# Patient Record
Sex: Female | Born: 1945
Health system: Southern US, Community
[De-identification: ages and names within clinical notes are randomized; demographics above are authoritative.]

## PROBLEM LIST (undated history)

## (undated) DIAGNOSIS — D259 Leiomyoma of uterus, unspecified: Secondary | ICD-10-CM

## (undated) DIAGNOSIS — I10 Essential (primary) hypertension: Secondary | ICD-10-CM

## (undated) DIAGNOSIS — R739 Hyperglycemia, unspecified: Secondary | ICD-10-CM

## (undated) DIAGNOSIS — I639 Cerebral infarction, unspecified: Secondary | ICD-10-CM

## (undated) DIAGNOSIS — IMO0002 Reserved for concepts with insufficient information to code with codable children: Secondary | ICD-10-CM

## (undated) DIAGNOSIS — Z86711 Personal history of pulmonary embolism: Secondary | ICD-10-CM

## (undated) DIAGNOSIS — Z780 Asymptomatic menopausal state: Secondary | ICD-10-CM

## (undated) DIAGNOSIS — N870 Mild cervical dysplasia: Secondary | ICD-10-CM

## (undated) DIAGNOSIS — H269 Unspecified cataract: Secondary | ICD-10-CM

## (undated) DIAGNOSIS — Z803 Family history of malignant neoplasm of breast: Secondary | ICD-10-CM

## (undated) DIAGNOSIS — E119 Type 2 diabetes mellitus without complications: Secondary | ICD-10-CM

## (undated) DIAGNOSIS — F32A Depression, unspecified: Secondary | ICD-10-CM

## (undated) DIAGNOSIS — F419 Anxiety disorder, unspecified: Secondary | ICD-10-CM

## (undated) DIAGNOSIS — H3320 Serous retinal detachment, unspecified eye: Secondary | ICD-10-CM

## (undated) DIAGNOSIS — F329 Major depressive disorder, single episode, unspecified: Secondary | ICD-10-CM

## (undated) HISTORY — DX: Asymptomatic menopausal state: Z78.0

## (undated) HISTORY — DX: Essential (primary) hypertension: I10

## (undated) HISTORY — DX: Mild cervical dysplasia: N87.0

## (undated) HISTORY — PX: OTHER SURGICAL HISTORY: SHX169

## (undated) HISTORY — DX: Unspecified cataract: H26.9

## (undated) HISTORY — DX: Family history of malignant neoplasm of breast: Z80.3

## (undated) HISTORY — DX: Personal history of pulmonary embolism: Z86.711

## (undated) HISTORY — DX: Hyperglycemia, unspecified: R73.9

## (undated) HISTORY — DX: Serous retinal detachment, unspecified eye: H33.20

## (undated) HISTORY — PX: HYSTEROSCOPY: SHX211

## (undated) HISTORY — DX: Anxiety disorder, unspecified: F41.9

## (undated) HISTORY — DX: Leiomyoma of uterus, unspecified: D25.9

## (undated) HISTORY — DX: Reserved for concepts with insufficient information to code with codable children: IMO0002

## (undated) HISTORY — PX: BREAST CYST ASPIRATION: SHX578

## (undated) HISTORY — DX: Type 2 diabetes mellitus without complications: E11.9

## (undated) HISTORY — DX: Depression, unspecified: F32.A

## (undated) HISTORY — DX: Major depressive disorder, single episode, unspecified: F32.9

## (undated) HISTORY — PX: RETINAL DETACHMENT SURGERY: SHX105

## (undated) HISTORY — DX: Cerebral infarction, unspecified: I63.9

## (undated) HISTORY — PX: EYE SURGERY: SHX253

## (undated) HISTORY — PX: DILATION AND CURETTAGE OF UTERUS: SHX78

---

## 1999-12-18 ENCOUNTER — Encounter: Payer: Self-pay | Admitting: Neurosurgery

## 1999-12-22 ENCOUNTER — Encounter: Payer: Self-pay | Admitting: Neurosurgery

## 1999-12-22 ENCOUNTER — Inpatient Hospital Stay (HOSPITAL_COMMUNITY): Admission: RE | Admit: 1999-12-22 | Discharge: 1999-12-23 | Payer: Self-pay | Admitting: Neurosurgery

## 2000-01-20 ENCOUNTER — Encounter: Admission: RE | Admit: 2000-01-20 | Discharge: 2000-01-20 | Payer: Self-pay | Admitting: Neurosurgery

## 2000-01-20 ENCOUNTER — Encounter: Payer: Self-pay | Admitting: Neurosurgery

## 2000-04-06 ENCOUNTER — Ambulatory Visit (HOSPITAL_COMMUNITY): Admission: RE | Admit: 2000-04-06 | Discharge: 2000-04-06 | Payer: Self-pay | Admitting: Neurosurgery

## 2000-04-06 ENCOUNTER — Encounter: Payer: Self-pay | Admitting: Neurosurgery

## 2001-01-10 ENCOUNTER — Encounter: Admission: RE | Admit: 2001-01-10 | Discharge: 2001-01-10 | Payer: Self-pay | Admitting: Gynecology

## 2001-01-10 ENCOUNTER — Encounter: Payer: Self-pay | Admitting: Gynecology

## 2001-07-18 ENCOUNTER — Encounter: Payer: Self-pay | Admitting: Ophthalmology

## 2001-07-18 ENCOUNTER — Ambulatory Visit (HOSPITAL_COMMUNITY): Admission: RE | Admit: 2001-07-18 | Discharge: 2001-07-19 | Payer: Self-pay | Admitting: Ophthalmology

## 2002-04-02 ENCOUNTER — Encounter: Admission: RE | Admit: 2002-04-02 | Discharge: 2002-04-02 | Payer: Self-pay | Admitting: Neurosurgery

## 2002-04-02 ENCOUNTER — Encounter: Payer: Self-pay | Admitting: Neurosurgery

## 2005-06-03 ENCOUNTER — Ambulatory Visit: Payer: Self-pay | Admitting: Obstetrics and Gynecology

## 2006-05-26 ENCOUNTER — Emergency Department: Payer: Self-pay | Admitting: General Practice

## 2006-05-26 ENCOUNTER — Other Ambulatory Visit: Payer: Self-pay

## 2006-08-17 ENCOUNTER — Ambulatory Visit: Payer: Self-pay | Admitting: General Surgery

## 2008-10-11 HISTORY — PX: CHOLECYSTECTOMY: SHX55

## 2011-11-19 ENCOUNTER — Encounter (INDEPENDENT_AMBULATORY_CARE_PROVIDER_SITE_OTHER): Payer: Medicare Other | Admitting: Ophthalmology

## 2011-11-19 DIAGNOSIS — H2510 Age-related nuclear cataract, unspecified eye: Secondary | ICD-10-CM

## 2011-11-19 DIAGNOSIS — H3530 Unspecified macular degeneration: Secondary | ICD-10-CM

## 2011-11-19 DIAGNOSIS — H33009 Unspecified retinal detachment with retinal break, unspecified eye: Secondary | ICD-10-CM

## 2011-11-19 DIAGNOSIS — H43819 Vitreous degeneration, unspecified eye: Secondary | ICD-10-CM

## 2012-10-11 HISTORY — PX: OTHER SURGICAL HISTORY: SHX169

## 2012-10-30 ENCOUNTER — Ambulatory Visit: Payer: Self-pay | Admitting: Obstetrics and Gynecology

## 2012-11-20 ENCOUNTER — Ambulatory Visit (INDEPENDENT_AMBULATORY_CARE_PROVIDER_SITE_OTHER): Payer: No Typology Code available for payment source | Admitting: Ophthalmology

## 2013-09-10 DIAGNOSIS — IMO0002 Reserved for concepts with insufficient information to code with codable children: Secondary | ICD-10-CM

## 2013-09-10 DIAGNOSIS — N870 Mild cervical dysplasia: Secondary | ICD-10-CM

## 2013-09-10 HISTORY — DX: Reserved for concepts with insufficient information to code with codable children: IMO0002

## 2013-09-10 HISTORY — DX: Mild cervical dysplasia: N87.0

## 2014-01-08 DIAGNOSIS — H20049 Secondary noninfectious iridocyclitis, unspecified eye: Secondary | ICD-10-CM | POA: Diagnosis not present

## 2014-01-14 DIAGNOSIS — R059 Cough, unspecified: Secondary | ICD-10-CM | POA: Diagnosis not present

## 2014-01-14 DIAGNOSIS — R05 Cough: Secondary | ICD-10-CM | POA: Diagnosis not present

## 2014-01-14 DIAGNOSIS — N3000 Acute cystitis without hematuria: Secondary | ICD-10-CM | POA: Diagnosis not present

## 2014-01-14 DIAGNOSIS — B3789 Other sites of candidiasis: Secondary | ICD-10-CM | POA: Diagnosis not present

## 2014-01-14 DIAGNOSIS — R0982 Postnasal drip: Secondary | ICD-10-CM | POA: Diagnosis not present

## 2014-03-12 DIAGNOSIS — L821 Other seborrheic keratosis: Secondary | ICD-10-CM | POA: Diagnosis not present

## 2014-03-12 DIAGNOSIS — L259 Unspecified contact dermatitis, unspecified cause: Secondary | ICD-10-CM | POA: Diagnosis not present

## 2014-03-25 DIAGNOSIS — L821 Other seborrheic keratosis: Secondary | ICD-10-CM | POA: Diagnosis not present

## 2014-03-25 DIAGNOSIS — D1801 Hemangioma of skin and subcutaneous tissue: Secondary | ICD-10-CM | POA: Diagnosis not present

## 2014-03-25 DIAGNOSIS — L570 Actinic keratosis: Secondary | ICD-10-CM | POA: Diagnosis not present

## 2014-04-08 DIAGNOSIS — L821 Other seborrheic keratosis: Secondary | ICD-10-CM | POA: Diagnosis not present

## 2014-04-08 DIAGNOSIS — L259 Unspecified contact dermatitis, unspecified cause: Secondary | ICD-10-CM | POA: Diagnosis not present

## 2014-04-08 DIAGNOSIS — D1801 Hemangioma of skin and subcutaneous tissue: Secondary | ICD-10-CM | POA: Diagnosis not present

## 2014-04-08 DIAGNOSIS — L570 Actinic keratosis: Secondary | ICD-10-CM | POA: Diagnosis not present

## 2014-04-23 DIAGNOSIS — N870 Mild cervical dysplasia: Secondary | ICD-10-CM | POA: Diagnosis not present

## 2014-04-23 DIAGNOSIS — Z780 Asymptomatic menopausal state: Secondary | ICD-10-CM | POA: Diagnosis not present

## 2014-04-23 DIAGNOSIS — F341 Dysthymic disorder: Secondary | ICD-10-CM | POA: Diagnosis not present

## 2014-04-23 DIAGNOSIS — Z01419 Encounter for gynecological examination (general) (routine) without abnormal findings: Secondary | ICD-10-CM | POA: Diagnosis not present

## 2014-04-23 LAB — HM PAP SMEAR: HM Pap smear: NEGATIVE

## 2014-05-07 ENCOUNTER — Ambulatory Visit: Payer: Self-pay | Admitting: Obstetrics and Gynecology

## 2014-05-07 DIAGNOSIS — Z1231 Encounter for screening mammogram for malignant neoplasm of breast: Secondary | ICD-10-CM | POA: Diagnosis not present

## 2014-05-07 LAB — HM MAMMOGRAPHY

## 2014-05-15 DIAGNOSIS — L570 Actinic keratosis: Secondary | ICD-10-CM | POA: Diagnosis not present

## 2014-05-15 DIAGNOSIS — L821 Other seborrheic keratosis: Secondary | ICD-10-CM | POA: Diagnosis not present

## 2014-06-12 DIAGNOSIS — D235 Other benign neoplasm of skin of trunk: Secondary | ICD-10-CM | POA: Diagnosis not present

## 2014-06-12 DIAGNOSIS — D485 Neoplasm of uncertain behavior of skin: Secondary | ICD-10-CM | POA: Diagnosis not present

## 2014-06-12 DIAGNOSIS — L821 Other seborrheic keratosis: Secondary | ICD-10-CM | POA: Diagnosis not present

## 2014-06-24 ENCOUNTER — Ambulatory Visit: Payer: Self-pay | Admitting: Unknown Physician Specialty

## 2014-06-24 DIAGNOSIS — K648 Other hemorrhoids: Secondary | ICD-10-CM | POA: Diagnosis not present

## 2014-06-24 DIAGNOSIS — Z885 Allergy status to narcotic agent status: Secondary | ICD-10-CM | POA: Diagnosis not present

## 2014-06-24 DIAGNOSIS — F3289 Other specified depressive episodes: Secondary | ICD-10-CM | POA: Diagnosis not present

## 2014-06-24 DIAGNOSIS — Z1211 Encounter for screening for malignant neoplasm of colon: Secondary | ICD-10-CM | POA: Diagnosis not present

## 2014-06-24 DIAGNOSIS — Z86711 Personal history of pulmonary embolism: Secondary | ICD-10-CM | POA: Diagnosis not present

## 2014-06-24 DIAGNOSIS — K5289 Other specified noninfective gastroenteritis and colitis: Secondary | ICD-10-CM | POA: Diagnosis not present

## 2014-06-25 LAB — HM COLONOSCOPY

## 2014-06-27 LAB — PATHOLOGY REPORT

## 2014-07-18 DIAGNOSIS — Z23 Encounter for immunization: Secondary | ICD-10-CM | POA: Diagnosis not present

## 2014-07-23 DIAGNOSIS — L738 Other specified follicular disorders: Secondary | ICD-10-CM | POA: Diagnosis not present

## 2014-07-23 DIAGNOSIS — L821 Other seborrheic keratosis: Secondary | ICD-10-CM | POA: Diagnosis not present

## 2014-07-23 DIAGNOSIS — L918 Other hypertrophic disorders of the skin: Secondary | ICD-10-CM | POA: Diagnosis not present

## 2014-07-23 DIAGNOSIS — Z1283 Encounter for screening for malignant neoplasm of skin: Secondary | ICD-10-CM | POA: Diagnosis not present

## 2014-10-21 DIAGNOSIS — N390 Urinary tract infection, site not specified: Secondary | ICD-10-CM | POA: Diagnosis not present

## 2014-12-26 DIAGNOSIS — B009 Herpesviral infection, unspecified: Secondary | ICD-10-CM | POA: Diagnosis not present

## 2014-12-26 DIAGNOSIS — D485 Neoplasm of uncertain behavior of skin: Secondary | ICD-10-CM | POA: Diagnosis not present

## 2014-12-26 DIAGNOSIS — L304 Erythema intertrigo: Secondary | ICD-10-CM | POA: Diagnosis not present

## 2015-02-03 DIAGNOSIS — R300 Dysuria: Secondary | ICD-10-CM | POA: Diagnosis not present

## 2015-04-01 DIAGNOSIS — L728 Other follicular cysts of the skin and subcutaneous tissue: Secondary | ICD-10-CM | POA: Diagnosis not present

## 2015-04-21 DIAGNOSIS — H2513 Age-related nuclear cataract, bilateral: Secondary | ICD-10-CM | POA: Diagnosis not present

## 2015-04-29 ENCOUNTER — Ambulatory Visit (INDEPENDENT_AMBULATORY_CARE_PROVIDER_SITE_OTHER): Payer: Medicare Other | Admitting: Obstetrics and Gynecology

## 2015-04-29 ENCOUNTER — Encounter: Payer: Self-pay | Admitting: Obstetrics and Gynecology

## 2015-04-29 VITALS — BP 129/74 | HR 68 | Ht 67.0 in | Wt 154.3 lb

## 2015-04-29 DIAGNOSIS — N879 Dysplasia of cervix uteri, unspecified: Secondary | ICD-10-CM | POA: Diagnosis not present

## 2015-04-29 DIAGNOSIS — D259 Leiomyoma of uterus, unspecified: Secondary | ICD-10-CM | POA: Diagnosis not present

## 2015-04-29 DIAGNOSIS — R300 Dysuria: Secondary | ICD-10-CM

## 2015-04-29 DIAGNOSIS — D219 Benign neoplasm of connective and other soft tissue, unspecified: Secondary | ICD-10-CM | POA: Insufficient documentation

## 2015-04-29 DIAGNOSIS — F419 Anxiety disorder, unspecified: Secondary | ICD-10-CM | POA: Insufficient documentation

## 2015-04-29 DIAGNOSIS — R7303 Prediabetes: Secondary | ICD-10-CM | POA: Insufficient documentation

## 2015-04-29 DIAGNOSIS — N870 Mild cervical dysplasia: Secondary | ICD-10-CM | POA: Diagnosis not present

## 2015-04-29 DIAGNOSIS — Z0000 Encounter for general adult medical examination without abnormal findings: Secondary | ICD-10-CM | POA: Diagnosis not present

## 2015-04-29 DIAGNOSIS — R7309 Other abnormal glucose: Secondary | ICD-10-CM

## 2015-04-29 DIAGNOSIS — Z780 Asymptomatic menopausal state: Secondary | ICD-10-CM | POA: Diagnosis not present

## 2015-04-29 DIAGNOSIS — Z86711 Personal history of pulmonary embolism: Secondary | ICD-10-CM | POA: Insufficient documentation

## 2015-04-29 DIAGNOSIS — Z803 Family history of malignant neoplasm of breast: Secondary | ICD-10-CM | POA: Insufficient documentation

## 2015-04-29 DIAGNOSIS — Z1231 Encounter for screening mammogram for malignant neoplasm of breast: Secondary | ICD-10-CM | POA: Diagnosis not present

## 2015-04-29 DIAGNOSIS — H3320 Serous retinal detachment, unspecified eye: Secondary | ICD-10-CM | POA: Insufficient documentation

## 2015-04-29 DIAGNOSIS — Z0189 Encounter for other specified special examinations: Secondary | ICD-10-CM

## 2015-04-29 LAB — POCT URINALYSIS DIPSTICK
Bilirubin, UA: NEGATIVE
Blood, UA: NEGATIVE
Glucose, UA: 3
Ketones, UA: NEGATIVE
Nitrite, UA: NEGATIVE
Protein, UA: NEGATIVE
Spec Grav, UA: 1.02
Urobilinogen, UA: 0.2
pH, UA: 6

## 2015-04-29 MED ORDER — CITALOPRAM HYDROBROMIDE 10 MG PO TABS: 10.0000 mg | ORAL_TABLET | Freq: Every day | ORAL | Status: DC

## 2015-04-29 NOTE — Progress Notes (Signed)
Patient ID: Anna Williams, female   DOB: 1945/12/05, 69 y.o.   MRN: [id number]   GYN ANNUAL PREVENTATIVE CARE ENCOUNTER NOTE  Subjective:       Anna Williams is a 69 y.o. No obstetric history on file. female here for a routine annual gynecologic exam.  Current complaints: 1.  Menopause. 2.  Prediabetes. 3.  Uterine fibroids. 4.  History of dysplasia-2014 5.  Anxiety 6.  Burning with urination  Medicare pap and breast exam   Colonoscopy- 07/04/2014 Needs refill of celexa 10mg    Gynecologic History No LMP recorded. Patient is postmenopausal. Contraception: post menopausal status Last Pap: 2015. Pap w/rflx- neg 04/2014 Last mammogram:  mammo-05/07/2014- birad 1  Obstetric History OB History  No data available    Past Medical History  Diagnosis Date  . Retinal detachment   . History of pulmonary embolus (PE)   . Anxiety and depression   . Elevated blood sugar   . Fibroid uterus   . Menopause   . Mild dysplasia of cervix 09/2013    repeat pap done -ecc neg, cerival bx cin 1  . ASCUS with positive high risk HPV 09/2013  . Family history of breast cancer in mother     Past Surgical History  Procedure Laterality Date  . Mole removed  2014  . Cholecystectomy  2010  . Dilation and curettage of uterus    . Hysteroscopy    . Anterior neck fusion    . Breast biopsy      Current Outpatient Prescriptions on File Prior to Visit  Medication Sig Dispense Refill  . Prenatal Vit-Fe Fumarate-FA (PRENATAL MULTIVITAMIN) TABS tablet Take 1 tablet by mouth daily at 12 noon.     No current facility-administered medications on file prior to visit.    Allergies  Allergen Reactions  . Codeine   . Lmx 4 [Lidocaine]     History   Social History  . Marital Status: Married    Spouse Name: N/A  . Number of Children: N/A  . Years of Education: N/A   Occupational History  . Not on file.   Social History Main Topics  . Smoking status: Never Smoker   . Smokeless tobacco:  Not on file  . Alcohol Use: No  . Drug Use: No  . Sexual Activity: No   Other Topics Concern  . Not on file   Social History Narrative    Family History  Problem Relation Age of Onset  . Breast cancer Mother   . Heart disease Father   . Colon cancer Neg Hx   . Ovarian cancer Neg Hx     The following portions of the patient's history were reviewed and updated as appropriate: allergies, current medications, past family history, past medical history, past social history, past surgical history and problem list.  Review of Systems Review of Systems  Constitutional: Negative.  Negative for fever, chills and weight loss.  Eyes: Negative for blurred vision, double vision and pain.  Respiratory: Negative.   Cardiovascular: Negative for chest pain, palpitations and leg swelling.  Gastrointestinal: Negative.   Genitourinary: Positive for dysuria.  Musculoskeletal: Negative.   Skin: Negative for rash.  Neurological: Negative.  Negative for headaches.  Endo/Heme/Allergies: Negative.   Psychiatric/Behavioral: The patient is nervous/anxious.      Objective:   BP 129/74 mmHg  Pulse 68  Ht 5\' 7"  (1.702 m)  Wt 154 lb 4.8 oz (69.99 kg)  BMI 24.16 kg/m2 Physical Exam  Constitutional: She is oriented  to person, place, and time. She appears well-developed and well-nourished.  HENT:  Head: Normocephalic and atraumatic.  Eyes: Conjunctivae are normal.  Neck: Normal range of motion. Neck supple. No thyromegaly present.  Cardiovascular: Normal rate, regular rhythm and normal heart sounds.   Pulmonary/Chest: Effort normal and breath sounds normal.  Abdominal: Soft. Bowel sounds are normal. She exhibits no distension and no mass. There is no tenderness. No hernia.  Genitourinary: Vagina normal and uterus normal. Uterus is not enlarged and not tender. Cervix exhibits no motion tenderness and no discharge. Right adnexum displays no mass and no tenderness. Left adnexum displays no mass and no  tenderness. No erythema in the vagina. No vaginal discharge found.  Musculoskeletal: Normal range of motion.  Lymphadenopathy:    She has no cervical adenopathy.  Neurological: She is alert and oriented to person, place, and time. No cranial nerve deficit.  Skin: Skin is warm and dry.  Psychiatric: She has a normal mood and affect. Her behavior is normal. Judgment and thought content normal.  Vitals reviewed.   Assessment:   Annual gynecologic examination 69 y.o. Contraception: none Normal BMI Prediabetes Anxiety, stable   Plan:  Pap: Pap, Reflex if ASCUS Mammogram: Ordered Labs: Lipid 1, FBS, TSH, Hemoglobin A1C and Vit D Level"". Routine preventative health maintenance measures emphasized: Diet/Weight control, Alcohol/Drug use and Stress Management  Refill Celexa Return to Clinic - 1 Year   Brayton Mars, MD

## 2015-04-30 LAB — PAP IG W/ RFLX HPV ASCU: PAP Smear Comment: 0

## 2015-04-30 LAB — VITAMIN D 25 HYDROXY (VIT D DEFICIENCY, FRACTURES): Vit D, 25-Hydroxy: 12.8 ng/mL — ABNORMAL LOW (ref 30.0–100.0)

## 2015-04-30 LAB — HEMOGLOBIN A1C
Est. average glucose Bld gHb Est-mCnc: 292 mg/dL
Hgb A1c MFr Bld: 11.8 % — ABNORMAL HIGH (ref 4.8–5.6)

## 2015-04-30 LAB — GLUCOSE, RANDOM: Glucose: 320 mg/dL — ABNORMAL HIGH (ref 65–99)

## 2015-04-30 LAB — LIPID PANEL
Chol/HDL Ratio: 3.1 ratio units (ref 0.0–4.4)
Cholesterol, Total: 151 mg/dL (ref 100–199)
HDL: 48 mg/dL (ref >39)
LDL Calculated: 77 mg/dL (ref 0–99)
Triglycerides: 132 mg/dL (ref 0–149)
VLDL Cholesterol Cal: 26 mg/dL (ref 5–40)

## 2015-04-30 LAB — TSH: TSH: 2.41 u[IU]/mL (ref 0.450–4.500)

## 2015-05-01 ENCOUNTER — Telehealth: Payer: Self-pay

## 2015-05-01 LAB — URINE CULTURE

## 2015-05-01 MED ORDER — NITROFURANTOIN MONOHYD MACRO 100 MG PO CAPS: 100.0000 mg | ORAL_CAPSULE | Freq: Two times a day (BID) | ORAL | Status: DC

## 2015-05-01 NOTE — Telephone Encounter (Signed)
Pt aware u/c pos for e. Coli. Per mad macrobid erx.

## 2015-05-01 NOTE — Telephone Encounter (Signed)
-----   Message from Brayton Mars, MD sent at 05/01/2015  7:53 AM EDT ----- Please notify - Abnormal Labs Await Sensitivity testing before prescribing antibiotic

## 2015-05-13 ENCOUNTER — Encounter: Payer: Self-pay | Admitting: Family Medicine

## 2015-05-13 ENCOUNTER — Ambulatory Visit (INDEPENDENT_AMBULATORY_CARE_PROVIDER_SITE_OTHER): Payer: Medicare Other | Admitting: Family Medicine

## 2015-05-13 VITALS — BP 138/76 | HR 72 | Temp 98.1°F | Resp 16 | Wt 153.0 lb

## 2015-05-13 DIAGNOSIS — R5383 Other fatigue: Secondary | ICD-10-CM | POA: Diagnosis not present

## 2015-05-13 DIAGNOSIS — N3001 Acute cystitis with hematuria: Secondary | ICD-10-CM | POA: Diagnosis not present

## 2015-05-13 DIAGNOSIS — E119 Type 2 diabetes mellitus without complications: Secondary | ICD-10-CM | POA: Diagnosis not present

## 2015-05-13 LAB — POCT UA - MICROSCOPIC ONLY
Bacteria, U Microscopic: NEGATIVE
Casts, Ur, LPF, POC: NEGATIVE
Crystals, Ur, HPF, POC: NEGATIVE
Epithelial cells, urine per micros: NEGATIVE
Mucus, UA: NEGATIVE
RBC, urine, microscopic: NEGATIVE
Yeast, UA: NEGATIVE

## 2015-05-13 LAB — GLUCOSE, POCT (MANUAL RESULT ENTRY): POC Glucose: 216 mg/dl — AB (ref 70–99)

## 2015-05-13 LAB — POCT URINALYSIS DIPSTICK
Bilirubin, UA: NEGATIVE
Glucose, UA: NEGATIVE
Ketones, UA: NEGATIVE
Nitrite, UA: NEGATIVE
Protein, UA: NEGATIVE
Spec Grav, UA: 1.015
Urobilinogen, UA: 0.2
pH, UA: 6

## 2015-05-13 MED ORDER — CIPROFLOXACIN HCL 500 MG PO TABS: 500.0000 mg | ORAL_TABLET | Freq: Two times a day (BID) | ORAL | Status: DC

## 2015-05-13 MED ORDER — ONETOUCH DELICA LANCETS FINE MISC: Status: AC

## 2015-05-13 MED ORDER — GLUCOSE BLOOD VI STRP: ORAL_STRIP | Status: DC

## 2015-05-13 MED ORDER — METFORMIN HCL 500 MG PO TABS
500.0000 mg | ORAL_TABLET | Freq: Every day | ORAL | Status: DC
Start: 2015-05-13 — End: 2016-03-29

## 2015-05-13 NOTE — Progress Notes (Signed)
Patient ID: Anna Williams, female   DOB: 1946-02-22, 69 y.o.   MRN: [id number]    Subjective:  HPI  UTI follow up:  SUBJECTIVE: Anna Williams is a 69 y.o. female who complains of urinary frequency, urgency, sometimes incontinence, burning with urination more of around the vaginal area tissue itself. She saw Dr.Defranscesco on July 19th and had a rutine CPE with pap done.  Urine culture was obtained and E Coli infection came back positive on the culture.  She completed Macrobid antibiotic and felt like her symptosm were improving but is now having the same symptoms again and is now having back pain-on the left and right side.   Prior to Admission medications   Medication Sig Start Date End Date Taking? Authorizing Provider  citalopram (CELEXA) 10 MG tablet Take 1 tablet (10 mg total) by mouth daily. 04/29/15  Yes Brayton Mars, MD  Prenatal Vit-Fe Fumarate-FA (PRENATAL MULTIVITAMIN) TABS tablet Take 1 tablet by mouth daily at 12 noon.   Yes Historical Provider, MD    Patient Active Problem List   Diagnosis Date Noted  . Menopause 04/29/2015  . Fibroids 04/29/2015  . Anxiety 04/29/2015  . Cervical dysplasia 04/29/2015  . Prediabetes 04/29/2015  . History of pulmonary embolus (PE) 04/29/2015  . Family history of breast cancer in mother 04/29/2015  . Retinal detachment 04/29/2015    Past Medical History  Diagnosis Date  . Retinal detachment   . History of pulmonary embolus (PE)   . Anxiety and depression   . Elevated blood sugar   . Fibroid uterus   . Menopause   . Mild dysplasia of cervix 09/2013    repeat pap done -ecc neg, cerival bx cin 1  . ASCUS with positive high risk HPV 09/2013  . Family history of breast cancer in mother     History   Social History  . Marital Status: Married    Spouse Name: N/A  . Number of Children: N/A  . Years of Education: N/A   Occupational History  . Not on file.   Social History Main Topics  . Smoking status: Never  Smoker   . Smokeless tobacco: Never Used  . Alcohol Use: No  . Drug Use: No  . Sexual Activity: No   Other Topics Concern  . Not on file   Social History Narrative    Allergies  Allergen Reactions  . Codeine   . Lmx 4 [Lidocaine]     Review of Systems  Constitutional: Negative for fever, chills and malaise/fatigue.  Respiratory: Negative for cough, hemoptysis, sputum production, shortness of breath and wheezing.   Cardiovascular: Negative for chest pain, palpitations, orthopnea, claudication and leg swelling.  Gastrointestinal: Positive for nausea. Negative for heartburn, vomiting, abdominal pain, constipation and blood in stool.  Genitourinary: Positive for dysuria, urgency and frequency.  Musculoskeletal: Negative for myalgias, back pain, joint pain, falls and neck pain.  Skin: Negative for rash.  Neurological: Negative for dizziness, weakness and headaches.  Psychiatric/Behavioral: Negative for depression.     There is no immunization history on file for this patient. Objective:  BP 138/76 mmHg  Pulse 72  Temp(Src) 98.1 F (36.7 C)  Resp 16  Wt 153 lb (69.4 kg)  Physical Exam  Constitutional: She is oriented to person, place, and time and well-developed, well-nourished, and in no distress.  HENT:  Head: Normocephalic.  Eyes: Conjunctivae are normal. Pupils are equal, round, and reactive to light.  Neck: Normal range of motion. Neck supple.  Cardiovascular: Normal rate, regular rhythm, normal heart sounds and intact distal pulses.   No murmur heard. Pulmonary/Chest: Effort normal and breath sounds normal. No respiratory distress. She has no wheezes.  Abdominal: Soft. Bowel sounds are normal.  No CVAT  Musculoskeletal: Normal range of motion. She exhibits no edema or tenderness.  Neurological: She is alert and oriented to person, place, and time. Gait normal.  Psychiatric: Mood, memory, affect and judgment normal.    Lab Results  Component Value Date    GLUCOSE 320* 04/29/2015   CHOL 151 04/29/2015   TRIG 132 04/29/2015   HDL 48 04/29/2015   LDLCALC 77 04/29/2015   TSH 2.410 04/29/2015   HGBA1C 11.8* 04/29/2015    CMP     Component Value Date/Time   GLUCOSE 320* 04/29/2015 0800    Assessment and Plan :  1. Acute cystitis with hematuria Will re send for culture again. This could be coming from sugar been out of control. Pending culture.  2. Diabetes mellitus type 2 without retinopathy New-atypcial for the patient, she has no family histry of diabetes and she is thin. Will also check lab test to see if she can possibly be Type 1 instead of 2. Pending results.Recent A1C on 04/29/15 was 11.6-will start Metformin but advised patient to wait till she is done with Cipro and as long as her kidney levels are ok pending lab results. [facility]. Provided meter to check her sugars.  3. Fatigue  4.Adjustment Reaction I have done the exam and reviewed the above chart and it is accurate to the best of my knowledge.         Patient was seen and examined by Dr. Eulas Post and note was scribed by Theressa Millard, RMA.    Miguel Aschoff MD [facility] 05/13/2015 11:11 AM

## 2015-05-14 LAB — COMPREHENSIVE METABOLIC PANEL
ALT: 66 IU/L — ABNORMAL HIGH (ref 0–32)
AST: 55 IU/L — ABNORMAL HIGH (ref 0–40)
Albumin/Globulin Ratio: 1.8 (ref 1.1–2.5)
Albumin: 4.6 g/dL (ref 3.6–4.8)
Alkaline Phosphatase: 105 IU/L (ref 39–117)
BUN/Creatinine Ratio: 13 (ref 11–26)
BUN: 9 mg/dL (ref 8–27)
Bilirubin Total: 1.1 mg/dL (ref 0.0–1.2)
CO2: 27 mmol/L (ref 18–29)
Calcium: 9.6 mg/dL (ref 8.7–10.3)
Chloride: 98 mmol/L (ref 97–108)
Creatinine, Ser: 0.69 mg/dL (ref 0.57–1.00)
GFR calc Af Amer: 103 mL/min/{1.73_m2} (ref >59)
GFR calc non Af Amer: 89 mL/min/{1.73_m2} (ref >59)
Globulin, Total: 2.5 g/dL (ref 1.5–4.5)
Glucose: 215 mg/dL — ABNORMAL HIGH (ref 65–99)
Potassium: 4.6 mmol/L (ref 3.5–5.2)
Sodium: 141 mmol/L (ref 134–144)
Total Protein: 7.1 g/dL (ref 6.0–8.5)

## 2015-05-14 LAB — TSH: TSH: 1.41 u[IU]/mL (ref 0.450–4.500)

## 2015-05-14 LAB — C-PEPTIDE: C-Peptide: 3 ng/mL (ref 1.1–4.4)

## 2015-05-15 LAB — URINE CULTURE

## 2015-05-19 ENCOUNTER — Encounter: Payer: Medicare Other | Attending: Family Medicine | Admitting: Dietician

## 2015-05-19 VITALS — BP 129/69 | Ht 67.0 in | Wt 153.8 lb

## 2015-05-19 DIAGNOSIS — E119 Type 2 diabetes mellitus without complications: Secondary | ICD-10-CM | POA: Insufficient documentation

## 2015-05-19 NOTE — Progress Notes (Signed)
Diabetes Self-Management Education  Visit Type: First/Initial  Appt. Start Time: 1030  Appt. End Time: 4332  05/19/2015  [name], identified by name and date of birth,   a 69 y.o. female with a diagnosis of Diabetes: Type 2.   ASSESSMENT  Blood pressure 129/69, height 5\' 7"  (1.702 m), weight 153 lb 12.8 oz (69.763 kg). Body mass index is 24.08 kg/(m^2).  Pt. has a BG meter but has not started testing BG's       Diabetes Self-Management Education - 05/19/15 1319    Visit Information   Visit Type First/Initial   Initial Visit   Diabetes Type Type 2   Date Diagnosed --  1 wk ago   Health Coping   How would you rate your overall health? Good   Psychosocial Assessment   Patient Belief/Attitude about Diabetes Motivated to manage diabetes   Self-care barriers None   Patient Concerns Glycemic Control;Problem Solving   Special Needs None   Preferred Learning Style Auditory   Learning Readiness Ready   What is the last grade level you completed in school? 14   Complications   Last HgB A1C per patient/outside source 11.6 %  04-29-15   How often do you check your blood sugar? 0 times/day (not testing)   Have you had a dilated eye exam in the past 12 months? Yes   Have you had a dental exam in the past 12 months? Yes  appt 06-2015   Are you checking your feet? Yes   Dietary Intake   Breakfast --  eats 3 meals/day-eats sweets/snack foods 4-5x/wk; eats fried foods 2-3x/wk   Snack (morning) --  eats 3 snacks/day   Snack (evening) --  often eats ice cream for bedtime snack   Beverage(s) --  drinks fruit juice x1/day; drinks sugar sweetened tea and coffee 6-7/day (uses 1/2 amount sugar); drinks only 2-3 glasses of water per day   Exercise   Exercise Type ADL's  walks a lot at work   Patient Education   Previous Diabetes Education No   Disease state  Definition of diabetes, type 1 and 2, and the diagnosis of diabetes;Explored patient's options for treatment of their  diabetes   Nutrition management  Role of diet in the treatment of diabetes and the relationship between the three main macronutrients and blood glucose level;Food label reading, portion sizes and measuring food.;Carbohydrate counting   Physical activity and exercise  Role of exercise on diabetes management, blood pressure control and cardiac health.   Medications Reviewed patients medication for diabetes, action, purpose, timing of dose and side effects.   Monitoring Purpose and frequency of SMBG.;Identified appropriate SMBG and/or A1C goals.;Yearly dilated eye exam;Taught/discussed recording of test results and interpretation of SMBG.   Acute complications Discussed and identified patients' treatment of hyperglycemia.   Chronic complications Relationship between chronic complications and blood glucose control;Dental care;Assessed and discussed foot care and prevention of foot problems   Psychosocial adjustment Role of stress on diabetes   Personal strategies to promote health Lifestyle issues that need to be addressed for better diabetes care;Helped patient develop diabetes management plan for (enter comment)      Individualized Plan for Diabetes Self-Management Training:   Learning Objective:  Patient will have a greater understanding of diabetes self-management. Patient education plan is to attend individual and/or group sessions per assessed needs and concerns. Improve glycemic control Prevent complications     Patient Instructions   Check blood sugars 2 x day before breakfast and 2  hrs after supper every day   Exercise:  Walk  for    10-15 minutes  3-4  days a week--ONLY if sugar under 250 Avoid sugar sweetened drinks (soda, tea, coffee, sports drinks, juices)-try artificial sweeteners Drink plenty of water Limit intake of sweets and snack foods and fried foods Make healthy food choices Eat 3 meals day,  1-2  snacks a day at bedtime and afternoon if desired Space meals 4-6 hours  apart Bring blood sugar records to the next appointment/class Get a Sharps container Return for appointment/classes on:  05-19-15 Bring meter to tonight's class  Education material provided: [employer] Guidelines  If problems or questions, patient to contact team via: [phone]  Future DSME appointment:   05-19-15

## 2015-05-19 NOTE — Patient Instructions (Signed)
  Check blood sugars 2 x day before breakfast and 2 hrs after supper every day   Exercise:  Walk  for    10-15 minutes  3-4  days a week--ONLY if sugar under 250  Avoid sugar sweetened drinks (soda, tea, coffee, sports drinks, juices)-try artificial sweeteners Drink plenty of water  Limit intake of sweets and snack foods and fried foods  Eat 3 meals day,  1-2  snacks a day  Space meals 4-6 hours apart  Bring blood sugar records to the next appointment/class  Get a Sharps container  Return for appointment/classes on:  05-19-15

## 2015-05-21 ENCOUNTER — Telehealth: Payer: Self-pay | Admitting: Dietician

## 2015-05-21 NOTE — Telephone Encounter (Signed)
Patient returned call, and stated she has rescheduled classes to begin on 06/23/15. She reports feeling GI upset from taking Metformin and unable to eat and come to class on 05/19/15.

## 2015-05-21 NOTE — Telephone Encounter (Signed)
Called patient regarding missed class which was scheduled for 05/19/15. Left message with date for next class series starting on 06/23/15, and requested pt return the call.

## 2015-05-27 ENCOUNTER — Ambulatory Visit (INDEPENDENT_AMBULATORY_CARE_PROVIDER_SITE_OTHER): Payer: Medicare Other | Admitting: Family Medicine

## 2015-05-27 ENCOUNTER — Encounter: Payer: Self-pay | Admitting: Family Medicine

## 2015-05-27 VITALS — BP 112/60 | HR 78 | Temp 97.9°F | Resp 16 | Wt 154.0 lb

## 2015-05-27 DIAGNOSIS — K759 Inflammatory liver disease, unspecified: Secondary | ICD-10-CM

## 2015-05-27 DIAGNOSIS — E119 Type 2 diabetes mellitus without complications: Secondary | ICD-10-CM

## 2015-05-27 DIAGNOSIS — N3001 Acute cystitis with hematuria: Secondary | ICD-10-CM

## 2015-05-27 DIAGNOSIS — K219 Gastro-esophageal reflux disease without esophagitis: Secondary | ICD-10-CM

## 2015-05-27 DIAGNOSIS — M545 Low back pain: Secondary | ICD-10-CM | POA: Diagnosis not present

## 2015-05-27 DIAGNOSIS — R1013 Epigastric pain: Secondary | ICD-10-CM | POA: Diagnosis not present

## 2015-05-27 MED ORDER — OMEPRAZOLE 20 MG PO CPDR: 20.0000 mg | DELAYED_RELEASE_CAPSULE | Freq: Every day | ORAL | Status: DC

## 2015-05-27 NOTE — Progress Notes (Signed)
Patient ID: Anna Williams, female   DOB: May 05, 1946, 69 y.o.   MRN: [id number]    Subjective:  HPI Pt reports that she has epigastric pain for about a week. She has been belching more than normal and nauseated and had diarrhea. She recent started Metformin 2 weeks ago. The pain radiates around her side. She reports that the pain gets better when she lays down. She was laying down a couple of nights ago and she felt a lump in the area that it hurts, he husband thinks it is a hiatial hernia.  Pt reports that she is just getting over a UTI/ kidney infection and would like her urine rechecked today to make sure it is gone.    Prior to Admission medications   Medication Sig Start Date End Date Taking? Authorizing Provider  Calcium Citrate-Vitamin D (CALCIUM + D PO) Take 1 tablet by mouth daily.   Yes Historical Provider, MD  citalopram (CELEXA) 10 MG tablet Take 1 tablet (10 mg total) by mouth daily. 04/29/15  Yes Alanda Slim Defrancesco, MD  glucose blood (ONE TOUCH ULTRA TEST) test strip Check sugar once daily 05/13/15  Yes Richard Maceo Pro., MD  metFORMIN (GLUCOPHAGE) 500 MG tablet Take 1 tablet (500 mg total) by mouth daily with breakfast. 05/13/15  Yes Jerrol Banana., MD  [facility] LANCETS FINE MISC Check sugar once daily 05/13/15  Yes Jerrol Banana., MD  Prenatal Vit-Fe Fumarate-FA (PRENATAL MULTIVITAMIN) TABS tablet Take 2 tablets by mouth daily at 12 noon.    Yes Historical Provider, MD    Patient Active Problem List   Diagnosis Date Noted  . Diabetes mellitus type 2 without retinopathy 05/27/2015  . Menopause 04/29/2015  . Fibroids 04/29/2015  . Anxiety 04/29/2015  . Cervical dysplasia 04/29/2015  . Prediabetes 04/29/2015  . History of pulmonary embolus (PE) 04/29/2015  . Family history of breast cancer in mother 04/29/2015  . Retinal detachment 04/29/2015    Past Medical History  Diagnosis Date  . Retinal detachment   . History of pulmonary embolus (PE)   .  Anxiety and depression   . Elevated blood sugar   . Fibroid uterus   . Menopause   . Mild dysplasia of cervix 09/2013    repeat pap done -ecc neg, cerival bx cin 1  . ASCUS with positive high risk HPV 09/2013  . Family history of breast cancer in mother     Social History   Social History  . Marital Status: Married    Spouse Name: N/A  . Number of Children: N/A  . Years of Education: N/A   Occupational History  . Not on file.   Social History Main Topics  . Smoking status: Never Smoker   . Smokeless tobacco: Never Used  . Alcohol Use: No  . Drug Use: No  . Sexual Activity: No   Other Topics Concern  . Not on file   Social History Narrative    Allergies  Allergen Reactions  . Codeine   . Lmx 4 [Lidocaine]     Review of Systems  Constitutional: Negative.   HENT: Negative.   Eyes: Negative.   Respiratory: Negative.   Cardiovascular: Negative.   Gastrointestinal: Positive for nausea, abdominal pain and diarrhea.       Belching  Genitourinary: Negative.   Musculoskeletal: Positive for back pain.  Skin: Negative.   Neurological: Negative.   Endo/Heme/Allergies: Negative.   Psychiatric/Behavioral: Negative.      There is no  immunization history on file for this patient. Objective:  BP 112/60 mmHg  Pulse 78  Temp(Src) 97.9 F (36.6 C) (Oral)  Resp 16  Wt 154 lb (69.854 kg)  Physical Exam  Constitutional: She is oriented to person, place, and time and well-developed, well-nourished, and in no distress.  HENT:  Head: Normocephalic and atraumatic.  Right Ear: External ear normal.  Left Ear: External ear normal.  Nose: Nose normal.  Eyes: Conjunctivae and EOM are normal. Pupils are equal, round, and reactive to light.  Neck: Normal range of motion. Neck supple.  Cardiovascular: Normal rate, regular rhythm, normal heart sounds and intact distal pulses.   Pulmonary/Chest: Effort normal and breath sounds normal.  Abdominal: Soft. Bowel sounds are normal.    Neurological: She is alert and oriented to person, place, and time. She has normal reflexes. Gait normal. GCS score is 15.  Skin: Skin is warm and dry.  There appears to be a mildly hyperpigmented area in the left upper abdomen. I do not think this is bruising.  Psychiatric: Mood, memory, affect and judgment normal.    Lab Results  Component Value Date   GLUCOSE 215* 05/13/2015   CHOL 151 04/29/2015   TRIG 132 04/29/2015   HDL 48 04/29/2015   LDLCALC 77 04/29/2015   TSH 1.410 05/13/2015   HGBA1C 11.8* 04/29/2015    CMP     Component Value Date/Time   NA 141 05/13/2015 1203   K 4.6 05/13/2015 1203   CL 98 05/13/2015 1203   CO2 27 05/13/2015 1203   GLUCOSE 215* 05/13/2015 1203   BUN 9 05/13/2015 1203   CREATININE 0.69 05/13/2015 1203   CALCIUM 9.6 05/13/2015 1203   PROT 7.1 05/13/2015 1203   AST 55* 05/13/2015 1203   ALT 66* 05/13/2015 1203   ALKPHOS 105 05/13/2015 1203   BILITOT 1.1 05/13/2015 1203   GFRNONAA 89 05/13/2015 1203   GFRAA 103 05/13/2015 1203    Assessment and Plan :  1. Diabetes mellitus type 2 without retinopathy   2. Acute cystitis with hematuria  - POCT urinalysis dipstick  3. Gastroesophageal reflux disease, esophagitis presence not specified I do think her GI side effects are from metformin. Hopefully the omeprazole will help and she will be able to continue this. - omeprazole (PRILOSEC) 20 MG capsule; Take 1 capsule (20 mg total) by mouth daily.  Dispense: 30 capsule; Refill: 12  4. Low back pain, unspecified back pain laterality, with sciatica presence unspecified   5. Hepatitis Rule out pancreatitis with thoracic back pain.  - Amylase - Lipase  6. Epigastric pain Possibly due to Metformin. Will start Omeprazole and if this does not work may have to stop Metformin and try another diabetic medication.  Patient was seen and examined by Dr. Miguel Aschoff, and noted scribed by Webb Laws, [place] MD [facility] 05/27/2015 4:17 PM

## 2015-05-28 ENCOUNTER — Telehealth: Payer: Self-pay | Admitting: Family Medicine

## 2015-05-28 LAB — LIPASE: Lipase: 55 U/L (ref 0–59)

## 2015-05-28 LAB — AMYLASE: Amylase: 57 U/L (ref 31–124)

## 2015-05-28 NOTE — Telephone Encounter (Signed)
Pt called looking for her UA results also her lab results.  Please call her back when these are ready. [phone].  Thanks Con Memos

## 2015-05-28 NOTE — Telephone Encounter (Signed)
Please review thank you-aa

## 2015-06-05 ENCOUNTER — Ambulatory Visit: Payer: Medicare Other | Admitting: Family Medicine

## 2015-06-09 ENCOUNTER — Encounter: Payer: Self-pay | Admitting: Family Medicine

## 2015-06-09 ENCOUNTER — Ambulatory Visit (INDEPENDENT_AMBULATORY_CARE_PROVIDER_SITE_OTHER): Payer: Medicare Other | Admitting: Family Medicine

## 2015-06-09 VITALS — BP 122/68 | HR 80 | Temp 98.2°F | Resp 16 | Wt 154.0 lb

## 2015-06-09 DIAGNOSIS — E119 Type 2 diabetes mellitus without complications: Secondary | ICD-10-CM | POA: Diagnosis not present

## 2015-06-09 DIAGNOSIS — K219 Gastro-esophageal reflux disease without esophagitis: Secondary | ICD-10-CM | POA: Diagnosis not present

## 2015-06-09 DIAGNOSIS — R1013 Epigastric pain: Secondary | ICD-10-CM | POA: Diagnosis not present

## 2015-06-09 NOTE — Progress Notes (Signed)
Patient ID: Anna Williams, female   DOB: 23-Jun-1946, 69 y.o.   MRN: [id number]    Subjective:  HPI Pt is here for a follow up from Epigastric pain. She reports that the reflux has gotten better since starting the Omeprazole, but she is still having pain in her shoulder blade. She reports that it is when she moves her shoulder and tender to the touch.   Prior to Admission medications   Medication Sig Start Date End Date Taking? Authorizing Provider  Calcium Citrate-Vitamin D (CALCIUM + D PO) Take 1 tablet by mouth daily.   Yes Historical Provider, MD  citalopram (CELEXA) 10 MG tablet Take 1 tablet (10 mg total) by mouth daily. 04/29/15  Yes Alanda Slim Defrancesco, MD  glucose blood (ONE TOUCH ULTRA TEST) test strip Check sugar once daily 05/13/15  Yes Richard Maceo Pro., MD  metFORMIN (GLUCOPHAGE) 500 MG tablet Take 1 tablet (500 mg total) by mouth daily with breakfast. 05/13/15  Yes Jerrol Banana., MD  omeprazole (PRILOSEC) 20 MG capsule Take 1 capsule (20 mg total) by mouth daily. 05/27/15  Yes Richard Maceo Pro., MD  [facility] LANCETS FINE MISC Check sugar once daily 05/13/15  Yes Jerrol Banana., MD  Prenatal Vit-Fe Fumarate-FA (PRENATAL MULTIVITAMIN) TABS tablet Take 2 tablets by mouth daily at 12 noon.     Historical Provider, MD    Patient Active Problem List   Diagnosis Date Noted  . Diabetes mellitus type 2 without retinopathy 05/27/2015  . Menopause 04/29/2015  . Fibroids 04/29/2015  . Anxiety 04/29/2015  . Cervical dysplasia 04/29/2015  . Prediabetes 04/29/2015  . History of pulmonary embolus (PE) 04/29/2015  . Family history of breast cancer in mother 04/29/2015  . Retinal detachment 04/29/2015    Past Medical History  Diagnosis Date  . Retinal detachment   . History of pulmonary embolus (PE)   . Anxiety and depression   . Elevated blood sugar   . Fibroid uterus   . Menopause   . Mild dysplasia of cervix 09/2013    repeat pap done -ecc neg, cerival  bx cin 1  . ASCUS with positive high risk HPV 09/2013  . Family history of breast cancer in mother     Social History   Social History  . Marital Status: Married    Spouse Name: N/A  . Number of Children: N/A  . Years of Education: N/A   Occupational History  . Not on file.   Social History Main Topics  . Smoking status: Never Smoker   . Smokeless tobacco: Never Used  . Alcohol Use: No  . Drug Use: No  . Sexual Activity: No   Other Topics Concern  . Not on file   Social History Narrative    Allergies  Allergen Reactions  . Codeine   . Lmx 4 [Lidocaine]     Review of Systems  Constitutional: Negative.   Eyes: Negative.   Respiratory: Negative.   Cardiovascular: Negative.   Gastrointestinal: Negative.   Genitourinary: Negative.   Musculoskeletal: Negative.   Skin: Negative.   Neurological: Positive for headaches.  Endo/Heme/Allergies: Negative.   Psychiatric/Behavioral: Negative.      There is no immunization history on file for this patient. Objective:  BP 122/68 mmHg  Pulse 80  Temp(Src) 98.2 F (36.8 C) (Oral)  Resp 16  Wt 154 lb (69.854 kg)  Physical Exam  Constitutional: She is oriented to person, place, and time and well-developed, well-nourished, and in  no distress.  HENT:  Head: Normocephalic and atraumatic.  Right Ear: External ear normal.  Left Ear: External ear normal.  Nose: Nose normal.  Mouth/Throat: Oropharynx is clear and moist.  Eyes: Conjunctivae are normal.  Neck: Neck supple.  Cardiovascular: Normal rate, regular rhythm, normal heart sounds and intact distal pulses.   Pulmonary/Chest: Effort normal and breath sounds normal.  Abdominal: Soft.  Neurological: She is alert and oriented to person, place, and time.  Skin: Skin is warm and dry.  Psychiatric: Mood, memory, affect and judgment normal.    Lab Results  Component Value Date   GLUCOSE 215* 05/13/2015   CHOL 151 04/29/2015   TRIG 132 04/29/2015   HDL 48 04/29/2015     LDLCALC 77 04/29/2015   TSH 1.410 05/13/2015   HGBA1C 11.8* 04/29/2015    CMP     Component Value Date/Time   NA 141 05/13/2015 1203   K 4.6 05/13/2015 1203   CL 98 05/13/2015 1203   CO2 27 05/13/2015 1203   GLUCOSE 215* 05/13/2015 1203   BUN 9 05/13/2015 1203   CREATININE 0.69 05/13/2015 1203   CALCIUM 9.6 05/13/2015 1203   PROT 7.1 05/13/2015 1203   AST 55* 05/13/2015 1203   ALT 66* 05/13/2015 1203   ALKPHOS 105 05/13/2015 1203   BILITOT 1.1 05/13/2015 1203   GFRNONAA 89 05/13/2015 1203   GFRAA 103 05/13/2015 1203    Assessment and Plan :  1. Diabetes mellitus type 2 without retinopathy Stay on low-dose metformin at 500 mg daily. Sugars seem to be much better. No other risk factors for diabetes and this is not a type 1 diabetes 2. Gastroesophageal reflux disease, esophagitis presence not specified Stay on omeprazole every morning  3. Epigastric pain This possibly was side effect from metformin completely. Failed low dose and stay on PPI. Return to clinic 2 months and check A1c    [facility] 06/09/2015 4:36 PM

## 2015-06-12 ENCOUNTER — Ambulatory Visit
Admission: RE | Admit: 2015-06-12 | Discharge: 2015-06-12 | Disposition: A | Discharge status: Home / Self Care | Payer: Medicare Other | Source: Ambulatory Visit | Attending: Obstetrics and Gynecology | Admitting: Obstetrics and Gynecology

## 2015-06-12 ENCOUNTER — Other Ambulatory Visit: Payer: Self-pay | Admitting: Obstetrics and Gynecology

## 2015-06-12 DIAGNOSIS — Z1231 Encounter for screening mammogram for malignant neoplasm of breast: Secondary | ICD-10-CM | POA: Diagnosis not present

## 2015-06-13 ENCOUNTER — Ambulatory Visit: Payer: No Typology Code available for payment source

## 2015-06-23 ENCOUNTER — Encounter: Payer: Self-pay | Admitting: Dietician

## 2015-06-23 ENCOUNTER — Ambulatory Visit: Payer: No Typology Code available for payment source

## 2015-06-23 NOTE — Progress Notes (Signed)
Patient cancelled attendance at class series beginning today; she rescheduled to series beginning 07/14/15.

## 2015-06-30 ENCOUNTER — Ambulatory Visit: Payer: No Typology Code available for payment source

## 2015-07-07 ENCOUNTER — Ambulatory Visit: Payer: No Typology Code available for payment source

## 2015-07-10 ENCOUNTER — Telehealth: Payer: Self-pay | Admitting: Family Medicine

## 2015-07-10 DIAGNOSIS — N390 Urinary tract infection, site not specified: Secondary | ICD-10-CM | POA: Diagnosis not present

## 2015-07-10 NOTE — Telephone Encounter (Signed)
It needs to be evaluated or referred.Can see Tawanna Sat Friday?

## 2015-07-10 NOTE — Telephone Encounter (Signed)
Pt advised and was made an appt, she was not very cooperative with the answer-aa

## 2015-07-10 NOTE — Telephone Encounter (Signed)
Pt said she was just in several weeks ago for uti.  She thinks she has another uti and wants to know if you can call in something for her.  She uses [facility].  Call Back is [phone]  Thanks Con Memos

## 2015-07-11 ENCOUNTER — Ambulatory Visit: Payer: Medicare Other | Admitting: Physician Assistant

## 2015-07-14 ENCOUNTER — Ambulatory Visit: Payer: No Typology Code available for payment source

## 2015-07-21 ENCOUNTER — Ambulatory Visit: Payer: No Typology Code available for payment source

## 2015-07-22 ENCOUNTER — Encounter: Payer: Self-pay | Admitting: Dietician

## 2015-07-28 ENCOUNTER — Ambulatory Visit: Payer: No Typology Code available for payment source

## 2015-08-11 ENCOUNTER — Other Ambulatory Visit: Payer: Self-pay

## 2015-08-11 MED ORDER — CITALOPRAM HYDROBROMIDE 10 MG PO TABS: 10.0000 mg | ORAL_TABLET | Freq: Every day | ORAL | Status: DC

## 2015-08-20 DIAGNOSIS — L239 Allergic contact dermatitis, unspecified cause: Secondary | ICD-10-CM | POA: Diagnosis not present

## 2015-08-20 DIAGNOSIS — L02426 Furuncle of left lower limb: Secondary | ICD-10-CM | POA: Diagnosis not present

## 2015-08-20 DIAGNOSIS — L02425 Furuncle of right lower limb: Secondary | ICD-10-CM | POA: Diagnosis not present

## 2015-08-21 ENCOUNTER — Ambulatory Visit: Payer: Medicare Other | Admitting: Family Medicine

## 2015-09-03 ENCOUNTER — Encounter: Payer: Self-pay | Admitting: Family Medicine

## 2015-09-03 ENCOUNTER — Ambulatory Visit (INDEPENDENT_AMBULATORY_CARE_PROVIDER_SITE_OTHER): Payer: Medicare Other | Admitting: Family Medicine

## 2015-09-03 VITALS — BP 144/82 | HR 66 | Temp 98.1°F | Resp 12 | Wt 154.0 lb

## 2015-09-03 DIAGNOSIS — E119 Type 2 diabetes mellitus without complications: Secondary | ICD-10-CM

## 2015-09-03 DIAGNOSIS — Z86711 Personal history of pulmonary embolism: Secondary | ICD-10-CM | POA: Diagnosis not present

## 2015-09-03 DIAGNOSIS — E1121 Type 2 diabetes mellitus with diabetic nephropathy: Secondary | ICD-10-CM | POA: Diagnosis not present

## 2015-09-03 DIAGNOSIS — K219 Gastro-esophageal reflux disease without esophagitis: Secondary | ICD-10-CM

## 2015-09-03 DIAGNOSIS — Z23 Encounter for immunization: Secondary | ICD-10-CM | POA: Diagnosis not present

## 2015-09-03 LAB — POCT UA - MICROALBUMIN: Microalbumin Ur, POC: 20 mg/L

## 2015-09-03 LAB — POCT GLYCOSYLATED HEMOGLOBIN (HGB A1C): Hemoglobin A1C: 6.8

## 2015-09-03 MED ORDER — LOSARTAN POTASSIUM 25 MG PO TABS: 25.0000 mg | ORAL_TABLET | Freq: Every day | ORAL | Status: DC

## 2015-09-03 NOTE — Progress Notes (Signed)
Patient ID: Anna Williams, female   DOB: 29-Apr-1946, 69 y.o.   MRN: [id number]    Subjective:  HPI  Diabetes follow up: Patient was last seen in August.  Lab Results  Component Value Date   HGBA1C 11.8* 04/29/2015  She is still taking Metformin and is tolerating it better. She does check her sugar and readings hve been around 135-155. NO hypoglycemic episodes.  GERD follow up: Symptoms are stable. Takes Omeprazole.  Medications unchanged except she is on antibiotic right now for a reaction she had and developed rash-she saw dermatologist for this.  Prior to Admission medications   Medication Sig Start Date End Date Taking? Authorizing Provider  Calcium Citrate-Vitamin D (CALCIUM + D PO) Take 1 tablet by mouth daily.    Historical Provider, MD  citalopram (CELEXA) 10 MG tablet Take 1 tablet (10 mg total) by mouth daily. 08/11/15   Alanda Slim Defrancesco, MD  glucose blood (ONE TOUCH ULTRA TEST) test strip Check sugar once daily 05/13/15   Jerrol Banana., MD  metFORMIN (GLUCOPHAGE) 500 MG tablet Take 1 tablet (500 mg total) by mouth daily with breakfast. 05/13/15   Jerrol Banana., MD  omeprazole (PRILOSEC) 20 MG capsule Take 1 capsule (20 mg total) by mouth daily. 05/27/15   Richard Maceo Pro., MD  [facility] DELICA LANCETS FINE MISC Check sugar once daily 05/13/15   Jerrol Banana., MD  Prenatal Vit-Fe Fumarate-FA (PRENATAL MULTIVITAMIN) TABS tablet Take 2 tablets by mouth daily at 12 noon.     Historical Provider, MD    Patient Active Problem List   Diagnosis Date Noted  . Diabetes mellitus type 2 without retinopathy ([place]) 05/27/2015  . Menopause 04/29/2015  . Fibroids 04/29/2015  . Anxiety 04/29/2015  . Cervical dysplasia 04/29/2015  . Prediabetes 04/29/2015  . History of pulmonary embolus (PE) 04/29/2015  . Family history of breast cancer in mother 04/29/2015  . Retinal detachment 04/29/2015    Past Medical History  Diagnosis Date  . Retinal detachment   .  History of pulmonary embolus (PE)   . Anxiety and depression   . Elevated blood sugar   . Fibroid uterus   . Menopause   . Mild dysplasia of cervix 09/2013    repeat pap done -ecc neg, cerival bx cin 1  . ASCUS with positive high risk HPV 09/2013  . Family history of breast cancer in mother     Social History   Social History  . Marital Status: Married    Spouse Name: N/A  . Number of Children: N/A  . Years of Education: N/A   Occupational History  . Not on file.   Social History Main Topics  . Smoking status: Never Smoker   . Smokeless tobacco: Never Used  . Alcohol Use: No  . Drug Use: No  . Sexual Activity: No   Other Topics Concern  . Not on file   Social History Narrative    Allergies  Allergen Reactions  . Codeine   . Lmx 4 [Lidocaine]     Review of Systems  Constitutional: Negative.   Eyes: Negative.   Respiratory: Negative.   Cardiovascular: Negative.   Gastrointestinal: Negative.   Musculoskeletal: Negative.   Skin: Positive for rash.  Neurological: Negative.   Endo/Heme/Allergies: Negative.   Psychiatric/Behavioral: Negative.      There is no immunization history on file for this patient. Objective:  BP 166/88 mmHg  Pulse 66  Temp(Src) 98.1 F (36.7 C)  Resp 12  Wt 154 lb (69.854 kg)  Physical Exam  Constitutional: She is oriented to person, place, and time and well-developed, well-nourished, and in no distress.  HENT:  Head: Normocephalic and atraumatic.  Right Ear: External ear normal.  Left Ear: External ear normal.  Nose: Nose normal.  Eyes: Conjunctivae are normal. Pupils are equal, round, and reactive to light.  Neck: Normal range of motion. Neck supple.  Cardiovascular: Normal rate, regular rhythm, normal heart sounds and intact distal pulses.   No murmur heard. Pulmonary/Chest: Effort normal and breath sounds normal. No respiratory distress. She has no wheezes.  Abdominal: Soft.  Musculoskeletal: Normal range of motion.  She exhibits no edema or tenderness.  Neurological: She is alert and oriented to person, place, and time. Gait normal.  Skin: Skin is warm and dry.  Psychiatric: Mood, memory, affect and judgment normal.    Lab Results  Component Value Date   GLUCOSE 215* 05/13/2015   CHOL 151 04/29/2015   TRIG 132 04/29/2015   HDL 48 04/29/2015   LDLCALC 77 04/29/2015   TSH 1.410 05/13/2015   HGBA1C 11.8* 04/29/2015    CMP     Component Value Date/Time   NA 141 05/13/2015 1203   K 4.6 05/13/2015 1203   CL 98 05/13/2015 1203   CO2 27 05/13/2015 1203   GLUCOSE 215* 05/13/2015 1203   BUN 9 05/13/2015 1203   CREATININE 0.69 05/13/2015 1203   CALCIUM 9.6 05/13/2015 1203   PROT 7.1 05/13/2015 1203   ALBUMIN 4.6 05/13/2015 1203   AST 55* 05/13/2015 1203   ALT 66* 05/13/2015 1203   ALKPHOS 105 05/13/2015 1203   BILITOT 1.1 05/13/2015 1203   GFRNONAA 89 05/13/2015 1203   GFRAA 103 05/13/2015 1203    Assessment and Plan :  1. Diabetes mellitus type 2 without retinopathy ([place]) A`C 6.8 much better.-Will start Losartan to protect kidney levels. - POCT HgB A1C - POCT UA - Microalbumin  2. Gastroesophageal reflux disease, esophagitis presence not specified  3. History of pulmonary embolus (PE)  4. Need for influenza vaccination - Flu vaccine HIGH DOSE PF (Fluzone High dose)  5. Need for pneumococcal vaccination - Pneumococcal conjugate vaccine 13-valent  6. Hypertension Will try Losartan for B/P also. Re check on the next visit. 7.DM with nephropathy Microalbumin noted on urine today.Start ARB. Patient was seen and examined by Dr. Eulas Post and note was scribed by Theressa Millard, RMA.  Miguel Aschoff MD [facility] 09/03/2015 8:45 AM

## 2015-09-16 DIAGNOSIS — B353 Tinea pedis: Secondary | ICD-10-CM | POA: Diagnosis not present

## 2015-09-16 DIAGNOSIS — L258 Unspecified contact dermatitis due to other agents: Secondary | ICD-10-CM | POA: Diagnosis not present

## 2015-09-16 DIAGNOSIS — L309 Dermatitis, unspecified: Secondary | ICD-10-CM | POA: Diagnosis not present

## 2015-09-16 DIAGNOSIS — L02425 Furuncle of right lower limb: Secondary | ICD-10-CM | POA: Diagnosis not present

## 2015-09-16 DIAGNOSIS — L02426 Furuncle of left lower limb: Secondary | ICD-10-CM | POA: Diagnosis not present

## 2015-10-02 ENCOUNTER — Ambulatory Visit: Payer: Medicare Other | Admitting: Family Medicine

## 2015-10-16 ENCOUNTER — Ambulatory Visit (INDEPENDENT_AMBULATORY_CARE_PROVIDER_SITE_OTHER): Payer: Medicare Other | Admitting: Family Medicine

## 2015-10-16 ENCOUNTER — Encounter: Payer: Self-pay | Admitting: Family Medicine

## 2015-10-16 VITALS — BP 114/68 | HR 80 | Temp 98.2°F | Resp 16 | Wt 155.0 lb

## 2015-10-16 DIAGNOSIS — I10 Essential (primary) hypertension: Secondary | ICD-10-CM | POA: Diagnosis not present

## 2015-10-16 DIAGNOSIS — E119 Type 2 diabetes mellitus without complications: Secondary | ICD-10-CM | POA: Diagnosis not present

## 2015-10-16 NOTE — Progress Notes (Signed)
Patient ID: Anna Williams, female   DOB: 08-Jul-1946, 70 y.o.   MRN: [id number]    Subjective:  HPI  Hypertension, follow-up:  BP Readings from Last 3 Encounters:  10/16/15 114/68  09/03/15 144/82  06/09/15 122/68    She was last seen for hypertension 4 weeks ago.  BP at that visit was 144/82. Management since that visit includes started Losartan. She reports good compliance with treatment. She is having side effects. Possibly, rash on her legs. She has been to the dermatologist and was told she had eczema. Pt wants to know if it could be from the new medication. Outside blood pressures are not being checked.  Wt Readings from Last 3 Encounters:  10/16/15 155 lb (70.308 kg)  09/03/15 154 lb (69.854 kg)  06/09/15 154 lb (69.854 kg)   ------------------------------------------------------------------------  Pt was started on Losartan for protection of her kidneys.    Prior to Admission medications   Medication Sig Start Date End Date Taking? Authorizing Provider  Calcium Citrate-Vitamin D (CALCIUM + D PO) Take 1 tablet by mouth daily.   Yes Historical Provider, MD  cephALEXin (KEFLEX) 500 MG capsule  08/20/15  Yes Historical Provider, MD  citalopram (CELEXA) 10 MG tablet Take 1 tablet (10 mg total) by mouth daily. 08/11/15  Yes Alanda Slim Defrancesco, MD  glucose blood (ONE TOUCH ULTRA TEST) test strip Check sugar once daily 05/13/15  Yes Richard Maceo Pro., MD  losartan (COZAAR) 25 MG tablet Take 1 tablet (25 mg total) by mouth daily. 09/03/15  Yes Richard Maceo Pro., MD  metFORMIN (GLUCOPHAGE) 500 MG tablet Take 1 tablet (500 mg total) by mouth daily with breakfast. 05/13/15  Yes Jerrol Banana., MD  omeprazole (PRILOSEC) 20 MG capsule Take 1 capsule (20 mg total) by mouth daily. 05/27/15  Yes Richard Maceo Pro., MD  [facility] LANCETS FINE MISC Check sugar once daily 05/13/15  Yes Jerrol Banana., MD  Prenatal Vit-Fe Fumarate-FA (PRENATAL MULTIVITAMIN) TABS  tablet Take 2 tablets by mouth daily at 12 noon.    Yes Historical Provider, MD    Patient Active Problem List   Diagnosis Date Noted  . Hypertension 10/16/2015  . Diabetes mellitus type 2 without retinopathy ([place]) 05/27/2015  . Menopause 04/29/2015  . Fibroids 04/29/2015  . Anxiety 04/29/2015  . Cervical dysplasia 04/29/2015  . Prediabetes 04/29/2015  . History of pulmonary embolus (PE) 04/29/2015  . Family history of breast cancer in mother 04/29/2015  . Retinal detachment 04/29/2015    Past Medical History  Diagnosis Date  . Retinal detachment   . History of pulmonary embolus (PE)   . Anxiety and depression   . Elevated blood sugar   . Fibroid uterus   . Menopause   . Mild dysplasia of cervix 09/2013    repeat pap done -ecc neg, cerival bx cin 1  . ASCUS with positive high risk HPV 09/2013  . Family history of breast cancer in mother     Social History   Social History  . Marital Status: Married    Spouse Name: N/A  . Number of Children: N/A  . Years of Education: N/A   Occupational History  . Not on file.   Social History Main Topics  . Smoking status: Never Smoker   . Smokeless tobacco: Never Used  . Alcohol Use: No  . Drug Use: No  . Sexual Activity: No   Other Topics Concern  . Not on file   Social  History Narrative    Allergies  Allergen Reactions  . Codeine   . Lmx 4 [Lidocaine]     Review of Systems  Constitutional: Negative.   HENT: Negative.   Eyes: Negative.   Cardiovascular: Negative.   Gastrointestinal: Negative.   Genitourinary: Negative.   Musculoskeletal: Negative.   Skin: Positive for itching and rash.  Neurological: Negative.   Endo/Heme/Allergies: Negative.   Psychiatric/Behavioral: Negative.     Immunization History  Administered Date(s) Administered  . Influenza, High Dose Seasonal PF 09/03/2015  . Pneumococcal Conjugate-13 09/03/2015   Objective:  BP 114/68 mmHg  Pulse 80  Temp(Src) 98.2 F (36.8 C) (Oral)   Resp 16  Wt 155 lb (70.308 kg)  Physical Exam  Constitutional: She is oriented to person, place, and time and well-developed, well-nourished, and in no distress.  Eyes: Conjunctivae and EOM are normal. Pupils are equal, round, and reactive to light.  Neck: Normal range of motion. Neck supple.  Cardiovascular: Normal rate, regular rhythm, normal heart sounds and intact distal pulses.   Pulmonary/Chest: Effort normal and breath sounds normal.  Abdominal: Soft.  Musculoskeletal: Normal range of motion.  Neurological: She is alert and oriented to person, place, and time. She has normal reflexes. Gait normal. GCS score is 15.  Skin: Skin is warm and dry. Rash noted.  Significant eczema legs. Sore on lower leg is healing fine.  Psychiatric: Mood, memory, affect and judgment normal.    Lab Results  Component Value Date   GLUCOSE 215* 05/13/2015   CHOL 151 04/29/2015   TRIG 132 04/29/2015   HDL 48 04/29/2015   LDLCALC 77 04/29/2015   TSH 1.410 05/13/2015   HGBA1C 6.8 09/03/2015    CMP     Component Value Date/Time   NA 141 05/13/2015 1203   K 4.6 05/13/2015 1203   CL 98 05/13/2015 1203   CO2 27 05/13/2015 1203   GLUCOSE 215* 05/13/2015 1203   BUN 9 05/13/2015 1203   CREATININE 0.69 05/13/2015 1203   CALCIUM 9.6 05/13/2015 1203   PROT 7.1 05/13/2015 1203   ALBUMIN 4.6 05/13/2015 1203   AST 55* 05/13/2015 1203   ALT 66* 05/13/2015 1203   ALKPHOS 105 05/13/2015 1203   BILITOT 1.1 05/13/2015 1203   GFRNONAA 89 05/13/2015 1203   GFRAA 103 05/13/2015 1203    Assessment and Plan :  1. Essential hypertension- doing well on Losartan, will continue and follow up in 3 months.  - Renal function panel  2. Diabetes mellitus type 2 without retinopathy ([place])  3. Eczema Per dermatology Patient was seen and examined by Dr. Miguel Aschoff, and noted scribed by Webb Laws, [place] MD [facility] 10/16/2015 4:25 PM

## 2015-10-17 ENCOUNTER — Telehealth: Payer: Self-pay

## 2015-10-17 LAB — RENAL FUNCTION PANEL
Albumin: 4.1 g/dL (ref 3.5–4.8)
BUN/Creatinine Ratio: 15 (ref 11–26)
BUN: 15 mg/dL (ref 8–27)
CO2: 22 mmol/L (ref 18–29)
Calcium: 9.3 mg/dL (ref 8.7–10.3)
Chloride: 94 mmol/L — ABNORMAL LOW (ref 96–106)
Creatinine, Ser: 0.98 mg/dL (ref 0.57–1.00)
GFR calc Af Amer: 68 mL/min/{1.73_m2} (ref >59)
GFR calc non Af Amer: 59 mL/min/{1.73_m2} — ABNORMAL LOW (ref >59)
Glucose: 436 mg/dL — ABNORMAL HIGH (ref 65–99)
Phosphorus: 4 mg/dL (ref 2.5–4.5)
Potassium: 4.7 mmol/L (ref 3.5–5.2)
Sodium: 133 mmol/L — ABNORMAL LOW (ref 134–144)

## 2015-10-17 NOTE — Telephone Encounter (Signed)
Pt advised labs were ok on her voicemail-aa

## 2015-10-29 DIAGNOSIS — L271 Localized skin eruption due to drugs and medicaments taken internally: Secondary | ICD-10-CM | POA: Diagnosis not present

## 2015-11-18 DIAGNOSIS — L2089 Other atopic dermatitis: Secondary | ICD-10-CM | POA: Diagnosis not present

## 2015-11-18 DIAGNOSIS — L271 Localized skin eruption due to drugs and medicaments taken internally: Secondary | ICD-10-CM | POA: Diagnosis not present

## 2015-12-15 DIAGNOSIS — N390 Urinary tract infection, site not specified: Secondary | ICD-10-CM | POA: Diagnosis not present

## 2016-01-06 DIAGNOSIS — L564 Polymorphous light eruption: Secondary | ICD-10-CM | POA: Diagnosis not present

## 2016-01-06 DIAGNOSIS — L271 Localized skin eruption due to drugs and medicaments taken internally: Secondary | ICD-10-CM | POA: Diagnosis not present

## 2016-01-06 DIAGNOSIS — L821 Other seborrheic keratosis: Secondary | ICD-10-CM | POA: Diagnosis not present

## 2016-01-06 DIAGNOSIS — M3210 Systemic lupus erythematosus, organ or system involvement unspecified: Secondary | ICD-10-CM | POA: Diagnosis not present

## 2016-01-23 DIAGNOSIS — L821 Other seborrheic keratosis: Secondary | ICD-10-CM | POA: Diagnosis not present

## 2016-01-23 DIAGNOSIS — L271 Localized skin eruption due to drugs and medicaments taken internally: Secondary | ICD-10-CM | POA: Diagnosis not present

## 2016-01-23 DIAGNOSIS — L2089 Other atopic dermatitis: Secondary | ICD-10-CM | POA: Diagnosis not present

## 2016-01-23 DIAGNOSIS — L564 Polymorphous light eruption: Secondary | ICD-10-CM | POA: Diagnosis not present

## 2016-03-25 DIAGNOSIS — L2089 Other atopic dermatitis: Secondary | ICD-10-CM | POA: Diagnosis not present

## 2016-03-25 DIAGNOSIS — L858 Other specified epidermal thickening: Secondary | ICD-10-CM | POA: Diagnosis not present

## 2016-03-29 ENCOUNTER — Other Ambulatory Visit: Payer: Self-pay

## 2016-03-29 DIAGNOSIS — E119 Type 2 diabetes mellitus without complications: Secondary | ICD-10-CM

## 2016-03-29 MED ORDER — METFORMIN HCL 500 MG PO TABS: 500.0000 mg | ORAL_TABLET | Freq: Every day | ORAL | Status: DC

## 2016-04-06 DIAGNOSIS — L299 Pruritus, unspecified: Secondary | ICD-10-CM | POA: Diagnosis not present

## 2016-04-06 DIAGNOSIS — L209 Atopic dermatitis, unspecified: Secondary | ICD-10-CM | POA: Diagnosis not present

## 2016-04-06 DIAGNOSIS — R21 Rash and other nonspecific skin eruption: Secondary | ICD-10-CM | POA: Diagnosis not present

## 2016-04-06 DIAGNOSIS — R208 Other disturbances of skin sensation: Secondary | ICD-10-CM | POA: Diagnosis not present

## 2016-04-29 ENCOUNTER — Encounter: Payer: Self-pay | Admitting: Obstetrics and Gynecology

## 2016-04-29 ENCOUNTER — Ambulatory Visit (INDEPENDENT_AMBULATORY_CARE_PROVIDER_SITE_OTHER): Payer: Medicare Other | Admitting: Obstetrics and Gynecology

## 2016-04-29 VITALS — BP 132/70 | HR 73 | Ht 67.0 in | Wt 154.7 lb

## 2016-04-29 DIAGNOSIS — Z124 Encounter for screening for malignant neoplasm of cervix: Secondary | ICD-10-CM | POA: Diagnosis not present

## 2016-04-29 DIAGNOSIS — R102 Pelvic and perineal pain unspecified side: Secondary | ICD-10-CM

## 2016-04-29 DIAGNOSIS — N952 Postmenopausal atrophic vaginitis: Secondary | ICD-10-CM | POA: Diagnosis not present

## 2016-04-29 DIAGNOSIS — Z1239 Encounter for other screening for malignant neoplasm of breast: Secondary | ICD-10-CM

## 2016-04-29 DIAGNOSIS — Z780 Asymptomatic menopausal state: Secondary | ICD-10-CM

## 2016-04-29 DIAGNOSIS — D259 Leiomyoma of uterus, unspecified: Secondary | ICD-10-CM | POA: Diagnosis not present

## 2016-04-29 LAB — POCT URINALYSIS DIPSTICK
Bilirubin, UA: NEGATIVE
Blood, UA: NEGATIVE
Ketones, UA: NEGATIVE
Leukocytes, UA: NEGATIVE
Nitrite, UA: NEGATIVE
Protein, UA: NEGATIVE
Spec Grav, UA: 1.01
Urobilinogen, UA: NEGATIVE
pH, UA: 6.5

## 2016-04-29 MED ORDER — CITALOPRAM HYDROBROMIDE 10 MG PO TABS: 10.0000 mg | ORAL_TABLET | Freq: Every day | ORAL | Status: DC

## 2016-04-29 NOTE — Progress Notes (Signed)
Patient ID: Anna Williams, female   DOB: [birth year], 70 y.o.   MRN: [id number] ANNUAL PREVENTATIVE CARE GYN  ENCOUNTER NOTE  Subjective:       Anna Williams is a 70 y.o. No obstetric history on file. female here for a routine annual gynecologic exam.  Current complaints: 1.  Pelvic pressure/pain  History of uterine fibroids. No HRT therapy. No vaginal bleeding or discharge. Bowel and bladder function are normal.   Gynecologic History No LMP recorded. Patient is postmenopausal. Contraception: post menopausal status Last Pap: 2016. Results were: normal Last mammogram: 2016. Results were: normal  Obstetric History OB History  No data available    Past Medical History  Diagnosis Date  . Retinal detachment   . History of pulmonary embolus (PE)   . Anxiety and depression   . Elevated blood sugar   . Fibroid uterus   . Menopause   . Mild dysplasia of cervix 09/2013    repeat pap done -ecc neg, cerival bx cin 1  . ASCUS with positive high risk HPV 09/2013  . Family history of breast cancer in mother   . Diabetes mellitus without complication [facility])     Past Surgical History  Procedure Laterality Date  . Mole removed  2014  . Cholecystectomy  2010  . Dilation and curettage of uterus    . Hysteroscopy    . Anterior neck fusion    . Breast cyst aspiration Left yrs ago    Current Outpatient Prescriptions on File Prior to Visit  Medication Sig Dispense Refill  . Calcium Citrate-Vitamin D (CALCIUM + D PO) Take 1 tablet by mouth daily.    . citalopram (CELEXA) 10 MG tablet Take 1 tablet (10 mg total) by mouth daily. 90 tablet 2  . glucose blood (ONE TOUCH ULTRA TEST) test strip Check sugar once daily 100 each 12  . metFORMIN (GLUCOPHAGE) 500 MG tablet Take 1 tablet (500 mg total) by mouth daily with breakfast. 90 tablet 3  . omeprazole (PRILOSEC) 20 MG capsule Take 1 capsule (20 mg total) by mouth daily. 30 capsule 12  . ONETOUCH DELICA LANCETS FINE MISC Check sugar once  daily 50 each 12   No current facility-administered medications on file prior to visit.    Allergies  Allergen Reactions  . Codeine   . Lmx 4 [Lidocaine]     Social History   Social History  . Marital Status: Married    Spouse Name: N/A  . Number of Children: N/A  . Years of Education: N/A   Occupational History  . Not on file.   Social History Main Topics  . Smoking status: Never Smoker   . Smokeless tobacco: Never Used  . Alcohol Use: No  . Drug Use: No  . Sexual Activity: No   Other Topics Concern  . Not on file   Social History Narrative    Family History  Problem Relation Age of Onset  . Breast cancer Mother 77  . Heart disease Father   . Colon cancer Neg Hx   . Ovarian cancer Neg Hx   . Throat cancer Brother   . Breast cancer Maternal Aunt     The following portions of the patient's history were reviewed and updated as appropriate: allergies, current medications, past family history, past medical history, past social history, past surgical history and problem list.  Review of Systems ROS Review of Systems - General ROS: negative for - chills, fatigue, fever, hot flashes, night sweats, weight gain  or weight loss Psychological ROS: negative for - anxiety, decreased libido, depression, mood swings, physical abuse or sexual abuse Ophthalmic ROS: negative for - blurry vision, eye pain or loss of vision ENT ROS: negative for - headaches, hearing change, visual changes or vocal changes Allergy and Immunology ROS: negative for - hives, itchy/watery eyes or seasonal allergies Hematological and Lymphatic ROS: negative for - bleeding problems, bruising, swollen lymph nodes or weight loss Endocrine ROS: negative for - galactorrhea, hair pattern changes, hot flashes, malaise/lethargy, mood swings, palpitations, polydipsia/polyuria, skin changes, temperature intolerance or unexpected weight changes Breast ROS: negative for - new or changing breast lumps or nipple  discharge Respiratory ROS: negative for - cough or shortness of breath Cardiovascular ROS: negative for - chest pain, irregular heartbeat, palpitations or shortness of breath Gastrointestinal ROS: no abdominal pain, change in bowel habits, or black or bloody stools Genito-Urinary ROS: no dysuria, trouble voiding, or hematuria Musculoskeletal ROS: negative for - joint pain or joint stiffness Neurological ROS: negative for - bowel and bladder control changes Dermatological ROS: negative for rash and skin lesion changes   Objective:   BP 132/70 mmHg  Pulse 73  Ht 5\' 7"  (1.702 m)  Wt 154 lb 11.2 oz (70.171 kg)  BMI 24.22 kg/m2 CONSTITUTIONAL: Well-developed, well-nourished female in no acute distress.  PSYCHIATRIC: Normal mood and affect. Normal behavior. Normal judgment and thought content. [place]: Alert and oriented to person, place, and time. Normal muscle tone coordination. No cranial nerve deficit noted. HENT:  Normocephalic, atraumatic, External right and left ear normal. Oropharynx is clear and moist EYES: Conjunctivae and EOM are normal. No scleral icterus.  NECK: Normal range of motion, supple, no masses.  Normal thyroid.  SKIN: Skin is warm and dry. No rash noted. Not diaphoretic. No erythema. No pallor. CARDIOVASCULAR: Normal heart rate noted, regular rhythm, no murmur. RESPIRATORY: Clear to auscultation bilaterally. Effort and breath sounds normal, no problems with respiration noted. BREASTS: Symmetric in size. No masses, skin changes, nipple drainage, or lymphadenopathy. ABDOMEN: Soft, normal bowel sounds, no distention noted.  No tenderness, rebound or guarding.  BLADDER: Normal PELVIC:  External Genitalia: Normal  BUS: Normal  Vagina: Moderate atrophy  Cervix: No lesions  Uterus: Mid plane, normal size and shape, nontender  Adnexa: Normal  RV: External Exam NormaI, No Rectal Masses and Normal Sphincter tone  MUSCULOSKELETAL: Normal range of motion. No tenderness.  No  cyanosis, clubbing, or edema.  2+ distal pulses. LYMPHATIC: No Axillary, Supraclavicular, or Inguinal Adenopathy.    Assessment:   Annual gynecologic examination 70 y.o. Contraception: post menopausal status Normal BMI Menopause Vaginal atrophy  Plan:  Pap: Not needed Mammogram: Ordered Stool Guaiac Testing:  07/2015- colonoscopy - wnl Labs: thru pcp Routine preventative health maintenance measures emphasized: Exercise/Diet/Weight control Return to [place], CMA  Brayton Mars, MD  Note: This dictation was prepared with Dragon dictation along with smaller phrase technology. Any transcriptional errors that result from this process are unintentional.

## 2016-04-29 NOTE — Patient Instructions (Signed)
1. No Pap smear2. Mammogram ordered3. Stool guaiac cards given4. Continue with healthy eating and exercise5. Calcium with vitamin D supplementation daily is recommended6. Return in 1 year

## 2016-04-30 LAB — URINE CULTURE

## 2016-05-27 DIAGNOSIS — M7071 Other bursitis of hip, right hip: Secondary | ICD-10-CM | POA: Diagnosis not present

## 2016-05-27 DIAGNOSIS — M533 Sacrococcygeal disorders, not elsewhere classified: Secondary | ICD-10-CM | POA: Diagnosis not present

## 2016-06-10 ENCOUNTER — Encounter: Payer: Self-pay | Admitting: Family Medicine

## 2016-06-10 ENCOUNTER — Ambulatory Visit (INDEPENDENT_AMBULATORY_CARE_PROVIDER_SITE_OTHER): Payer: Medicare Other | Admitting: Family Medicine

## 2016-06-10 ENCOUNTER — Telehealth: Payer: Self-pay

## 2016-06-10 VITALS — BP 122/72 | HR 90 | Temp 97.6°F | Resp 16 | Wt 147.0 lb

## 2016-06-10 DIAGNOSIS — E119 Type 2 diabetes mellitus without complications: Secondary | ICD-10-CM

## 2016-06-10 DIAGNOSIS — R350 Frequency of micturition: Secondary | ICD-10-CM

## 2016-06-10 LAB — POCT GLYCOSYLATED HEMOGLOBIN (HGB A1C): Hemoglobin A1C: 11.8

## 2016-06-10 MED ORDER — EMPAGLIFLOZIN 25 MG PO TABS: 25.0000 mg | ORAL_TABLET | Freq: Every day | ORAL | 12 refills | Status: DC

## 2016-06-10 MED ORDER — METFORMIN HCL 500 MG PO TABS: 500.0000 mg | ORAL_TABLET | Freq: Two times a day (BID) | ORAL | 3 refills | Status: DC

## 2016-06-10 MED ORDER — EMPAGLIFLOZIN 10 MG PO TABS: 10.0000 mg | ORAL_TABLET | Freq: Every day | ORAL | 0 refills | Status: DC

## 2016-06-10 NOTE — Progress Notes (Signed)
Subjective:  HPI  Diabetes Mellitus Type II, Follow-up:   Lab Results  Component Value Date   HGBA1C 6.8 09/03/2015   HGBA1C 11.8 (H) 04/29/2015    Last seen for diabetes 9 months ago.  Management since then includes none. She reports fair compliance with treatment. She is not having side effects.  Current symptoms include polydipsia, polyuria, weight loss and metallic taste in mouth and have been unchanged. Home blood sugar records: fasting blood sugar this morning 395  Episodes of hypoglycemia? no    Pertinent Labs:    Component Value Date/Time   CHOL 151 04/29/2015 0800   TRIG 132 04/29/2015 0800   HDL 48 04/29/2015 0800   LDLCALC 77 04/29/2015 0800   CREATININE 0.98 10/16/2015 1657    Wt Readings from Last 3 Encounters:  06/10/16 147 lb (66.7 kg)  04/29/16 154 lb 11.2 oz (70.2 kg)  10/16/15 155 lb (70.3 kg)    ------------------------------------------------------------------------  Pt reports that she has been under a lot of stress with family matters and has not been able to take care of her self. She noticed that her blood sugar was 395 this morning as she decided to test it because she felt very tired. She had not been checking it until today but had felt tired but thought it was because she was so stressed. She is still taking metformin 500 mg one daily. She has also lost weight. She was 155 in January. She is 147 today. Also she was taking Losartan but caused her to have a rash, so it was stopped.   Prior to Admission medications   Medication Sig Start Date End Date Taking? Authorizing Provider  betamethasone valerate (VALISONE) 0.1 % cream 1 APPLICATION APPLY ON THE SKIN AT BEDTIME 03/31/16   Historical Provider, MD  Calcium Citrate-Vitamin D (CALCIUM + D PO) Take 1 tablet by mouth daily.    Historical Provider, MD  citalopram (CELEXA) 10 MG tablet Take 1 tablet (10 mg total) by mouth daily. 04/29/16   Brayton Mars, MD  Dermatological Products,  Misc. (TETRIX) CREA Apply topically 2 (two) times daily. to affected area 03/30/16   Historical Provider, MD  glucose blood (ONE TOUCH ULTRA TEST) test strip Check sugar once daily 05/13/15   Jerrol Banana., MD  hydrOXYzine (ATARAX/VISTARIL) 10 MG tablet  04/23/16   Historical Provider, MD  metFORMIN (GLUCOPHAGE) 500 MG tablet Take 1 tablet (500 mg total) by mouth daily with breakfast. 03/29/16   Jerrol Banana., MD  omeprazole (PRILOSEC) 20 MG capsule Take 1 capsule (20 mg total) by mouth daily. 05/27/15   Richard Maceo Pro., MD  [facility] LANCETS FINE MISC Check sugar once daily 05/13/15   Jerrol Banana., MD    Patient Active Problem List   Diagnosis Date Noted  . Vaginal atrophy 04/29/2016  . Fibroid, uterine 04/29/2016  . Pelvic pain in female 04/29/2016  . Hypertension 10/16/2015  . Diabetes mellitus type 2 without retinopathy ([place]) 05/27/2015  . Menopause 04/29/2015  . Fibroids 04/29/2015  . Anxiety 04/29/2015  . Cervical dysplasia 04/29/2015  . Prediabetes 04/29/2015  . History of pulmonary embolus (PE) 04/29/2015  . Family history of breast cancer in mother 04/29/2015  . Retinal detachment 04/29/2015    Past Medical History:  Diagnosis Date  . Anxiety and depression   . ASCUS with positive high risk HPV 09/2013  . Diabetes mellitus without complication ([place])   . Elevated blood sugar   .  Family history of breast cancer in mother   . Fibroid uterus   . History of pulmonary embolus (PE)   . Menopause   . Mild dysplasia of cervix 09/2013   repeat pap done -ecc neg, cerival bx cin 1  . Retinal detachment     Social History   Social History  . Marital status: Married    Spouse name: N/A  . Number of children: N/A  . Years of education: N/A   Occupational History  . Not on file.   Social History Main Topics  . Smoking status: Never Smoker  . Smokeless tobacco: Never Used  . Alcohol use No  . Drug use: No  . Sexual activity: No   Other  Topics Concern  . Not on file   Social History Narrative  . No narrative on file    Allergies  Allergen Reactions  . Codeine   . Lmx 4 [Lidocaine]     Review of Systems  Constitutional: Positive for malaise/fatigue.  HENT: Negative.   Eyes: Negative.   Respiratory: Negative.   Cardiovascular: Negative.   Genitourinary: Negative.   Musculoskeletal: Negative.   Skin: Negative.   Neurological: Negative.   Endo/Heme/Allergies: Negative.   Psychiatric/Behavioral: Negative.     Immunization History  Administered Date(s) Administered  . Influenza, High Dose Seasonal PF 09/03/2015  . Pneumococcal Conjugate-13 09/03/2015   Objective:  BP 122/72 (BP Location: Left Arm, Patient Position: Sitting, Cuff Size: Normal)   Pulse 90   Temp 97.6 F (36.4 C) (Oral)   Resp 16   Wt 147 lb (66.7 kg)   BMI 23.02 kg/m   Physical Exam  Constitutional: She is oriented to person, place, and time and well-developed, well-nourished, and in no distress.  HENT:  Head: Normocephalic and atraumatic.  Right Ear: External ear normal.  Left Ear: External ear normal.  Nose: Nose normal.  Mouth/Throat: Oropharynx is clear and moist.  Eyes: Conjunctivae are normal. No scleral icterus.  Neck: No thyromegaly present.  Cardiovascular: Normal rate, regular rhythm, normal heart sounds and intact distal pulses.   Pulmonary/Chest: Effort normal and breath sounds normal.  Abdominal: Soft.  Lymphadenopathy:    She has no cervical adenopathy.  Neurological: She is alert and oriented to person, place, and time. No cranial nerve deficit. She exhibits normal muscle tone. Gait normal. Coordination normal.  Skin: Skin is warm and dry.  Psychiatric: Mood, memory, affect and judgment normal.    Lab Results  Component Value Date   GLUCOSE 436 (H) 10/16/2015   CHOL 151 04/29/2015   TRIG 132 04/29/2015   HDL 48 04/29/2015   LDLCALC 77 04/29/2015   TSH 1.410 05/13/2015   HGBA1C 6.8 09/03/2015    CMP       Component Value Date/Time   NA 133 (L) 10/16/2015 1657   K 4.7 10/16/2015 1657   CL 94 (L) 10/16/2015 1657   CO2 22 10/16/2015 1657   GLUCOSE 436 (H) 10/16/2015 1657   BUN 15 10/16/2015 1657   CREATININE 0.98 10/16/2015 1657   CALCIUM 9.3 10/16/2015 1657   PROT 7.1 05/13/2015 1203   ALBUMIN 4.1 10/16/2015 1657   AST 55 (H) 05/13/2015 1203   ALT 66 (H) 05/13/2015 1203   ALKPHOS 105 05/13/2015 1203   BILITOT 1.1 05/13/2015 1203   GFRNONAA 59 (L) 10/16/2015 1657   GFRAA 68 10/16/2015 1657    Assessment and Plan :  1. Diabetes mellitus type 2 without retinopathy ([place]) Increase metformin to BID and add Jardiance  10 until gone, then start Jardiance 25 mg daily.  POCT HgB A1C 11.8  - empagliflozin (JARDIANCE) 10 MG TABS tablet; Take 10 mg by mouth daily.  Dispense: 7 tablet; Refill: 0 - empagliflozin (JARDIANCE) 25 MG TABS tablet; Take 25 mg by mouth daily.  Dispense: 30 tablet; Refill: 12 - metFORMIN (GLUCOPHAGE) 500 MG tablet; Take 1 tablet (500 mg total) by mouth 2 (two) times daily.  Dispense: 90 tablet; Refill: 3  2. Urinary frequency probably due to elevated blood sugar but will check culture to be safe.  - Urine culture 3. Diabetic nephropathy Patient developed a rash secondary to losartan. We'll probably have to avoid Ace inhibitors also. Aggressively treat any elevated blood pressure  4. Weight loss I think the weight loss is from poorly controlled diabetes. She was checked for type 1 diabetes year ago. May need to recheck a C-peptide. At this time we'll add Jardiance titrated from 10-25 mg daily I'll see her back in 3-4 weeks. She is to check fasting blood sugars at least every morning.  Patient was seen and examined by Dr. Miguel Aschoff, and noted scribed by Webb Laws, CMA I have done the exam and reviewed the above chart and it is accurate to the best of my knowledge.  Miguel Aschoff MD [facility] 06/10/2016 3:18 PM

## 2016-06-10 NOTE — Telephone Encounter (Signed)
Patient called concern that her fasting blood sugar level this morning was at 395. She reports that she took her Metformin an hour in half ago. Patient rechecked her level a few minutes ago and it read 407 (she already had some food) half of a banana. She reports that she has been feeling tire, she loss 4 lbs this week. No dizziness, or lightheaded. Patient has an appointment at 2:45 pm.

## 2016-06-10 NOTE — Patient Instructions (Signed)
Take the Jardiance 10 mg until the sample is gone then take the Jardiance 25 mg daily, also increase Metformin 500 mg twice daily. Follow up in 2-3 weeks.s

## 2016-06-12 LAB — URINE CULTURE

## 2016-06-15 ENCOUNTER — Telehealth: Payer: Self-pay

## 2016-06-15 DIAGNOSIS — E119 Type 2 diabetes mellitus without complications: Secondary | ICD-10-CM

## 2016-06-15 MED ORDER — GLUCOSE BLOOD VI STRP: ORAL_STRIP | 12 refills | Status: DC

## 2016-06-15 NOTE — Telephone Encounter (Signed)
Advised pt of lab results. Pt verbally acknowledges understanding. Has ophthalmologist appointment this week. Sent in glucose test strips. Renaldo Fiddler, CMA

## 2016-06-15 NOTE — Telephone Encounter (Signed)
-----   Message from Jerrol Banana., MD sent at 06/15/2016  9:15 AM EDT ----- No UTI.

## 2016-06-16 ENCOUNTER — Other Ambulatory Visit: Payer: Self-pay | Admitting: Obstetrics and Gynecology

## 2016-06-16 ENCOUNTER — Ambulatory Visit
Admission: RE | Admit: 2016-06-16 | Discharge: 2016-06-16 | Disposition: A | Discharge status: Home / Self Care | Payer: Medicare Other | Source: Ambulatory Visit | Attending: Obstetrics and Gynecology | Admitting: Obstetrics and Gynecology

## 2016-06-16 DIAGNOSIS — Z1239 Encounter for other screening for malignant neoplasm of breast: Secondary | ICD-10-CM

## 2016-06-16 DIAGNOSIS — Z1231 Encounter for screening mammogram for malignant neoplasm of breast: Secondary | ICD-10-CM

## 2016-06-17 DIAGNOSIS — E113393 Type 2 diabetes mellitus with moderate nonproliferative diabetic retinopathy without macular edema, bilateral: Secondary | ICD-10-CM | POA: Diagnosis not present

## 2016-06-26 ENCOUNTER — Other Ambulatory Visit: Payer: Self-pay | Admitting: Family Medicine

## 2016-06-26 DIAGNOSIS — K219 Gastro-esophageal reflux disease without esophagitis: Secondary | ICD-10-CM

## 2016-06-29 ENCOUNTER — Ambulatory Visit (INDEPENDENT_AMBULATORY_CARE_PROVIDER_SITE_OTHER): Payer: Medicare Other | Admitting: Family Medicine

## 2016-06-29 DIAGNOSIS — Z23 Encounter for immunization: Secondary | ICD-10-CM

## 2016-06-29 NOTE — Progress Notes (Signed)
Patient came in for Influenza vaccine. Tolerated well. Vaccine given on Right Deltoid.

## 2016-06-30 ENCOUNTER — Encounter: Payer: Self-pay | Admitting: Family Medicine

## 2016-06-30 ENCOUNTER — Ambulatory Visit (INDEPENDENT_AMBULATORY_CARE_PROVIDER_SITE_OTHER): Payer: Medicare Other | Admitting: Family Medicine

## 2016-06-30 VITALS — BP 124/60 | HR 84 | Temp 98.7°F | Resp 18 | Wt 149.0 lb

## 2016-06-30 DIAGNOSIS — E119 Type 2 diabetes mellitus without complications: Secondary | ICD-10-CM

## 2016-06-30 MED ORDER — METFORMIN HCL 1000 MG PO TABS: 1000.0000 mg | ORAL_TABLET | Freq: Two times a day (BID) | ORAL | 12 refills | Status: DC

## 2016-06-30 NOTE — Progress Notes (Signed)
Subjective:  HPI  Diabetes Mellitus Type II, Follow-up:   Lab Results  Component Value Date   HGBA1C 11.8 06/10/2016   HGBA1C 6.8 09/03/2015   HGBA1C 11.8 (H) 04/29/2015    Last seen for diabetes 3 weeks ago.  Management since then includes increased Metformin and added Jardiance. She reports good compliance with treatment. She is not having side effects.  Home blood sugar records: fast blood sugars running 150-200.  Episodes of hypoglycemia? A couple of times, but ate and gets better.   Current Insulin Regimen: n/a Most Recent Eye Exam: within the month Pertinent Labs:    Component Value Date/Time   CHOL 151 04/29/2015 0800   TRIG 132 04/29/2015 0800   HDL 48 04/29/2015 0800   LDLCALC 77 04/29/2015 0800   CREATININE 0.98 10/16/2015 1657    Wt Readings from Last 3 Encounters:  06/30/16 149 lb (67.6 kg)  06/10/16 147 lb (66.7 kg)  04/29/16 154 lb 11.2 oz (70.2 kg)    ------------------------------------------------------------------------   Pt reports that she is feeling better.   Prior to Admission medications   Medication Sig Start Date End Date Taking? Authorizing Provider  betamethasone valerate (VALISONE) 0.1 % cream 1 APPLICATION APPLY ON THE SKIN AT BEDTIME 03/31/16   Historical Provider, MD  Calcium Citrate-Vitamin D (CALCIUM + D PO) Take 1 tablet by mouth daily.    Historical Provider, MD  citalopram (CELEXA) 10 MG tablet Take 1 tablet (10 mg total) by mouth daily. 04/29/16   Brayton Mars, MD  Dermatological Products, Misc. (TETRIX) CREA Apply topically 2 (two) times daily. to affected area 03/30/16   Historical Provider, MD  empagliflozin (JARDIANCE) 10 MG TABS tablet Take 10 mg by mouth daily. 06/10/16   Richard Maceo Pro., MD  empagliflozin (JARDIANCE) 25 MG TABS tablet Take 25 mg by mouth daily. 06/10/16   Richard Maceo Pro., MD  glucose blood (ONE TOUCH ULTRA TEST) test strip Check sugar once daily 06/15/16   Jerrol Banana., MD    hydrOXYzine (ATARAX/VISTARIL) 10 MG tablet  04/23/16   Historical Provider, MD  meloxicam (MOBIC) 15 MG tablet  05/27/16   Historical Provider, MD  metFORMIN (GLUCOPHAGE) 500 MG tablet Take 1 tablet (500 mg total) by mouth 2 (two) times daily. 06/10/16   Richard Maceo Pro., MD  omeprazole (PRILOSEC) 20 MG capsule TAKE 1 CAPSULE (20 MG TOTAL) BY MOUTH DAILY. 06/28/16   Richard Maceo Pro., MD  [facility] LANCETS FINE MISC Check sugar once daily 05/13/15   Jerrol Banana., MD    Patient Active Problem List   Diagnosis Date Noted  . Vaginal atrophy 04/29/2016  . Fibroid, uterine 04/29/2016  . Pelvic pain in female 04/29/2016  . Hypertension 10/16/2015  . Diabetes mellitus type 2 without retinopathy ([place]) 05/27/2015  . Menopause 04/29/2015  . Fibroids 04/29/2015  . Anxiety 04/29/2015  . Cervical dysplasia 04/29/2015  . Prediabetes 04/29/2015  . History of pulmonary embolus (PE) 04/29/2015  . Family history of breast cancer in mother 04/29/2015  . Retinal detachment 04/29/2015    Past Medical History:  Diagnosis Date  . Anxiety and depression   . ASCUS with positive high risk HPV 09/2013  . Diabetes mellitus without complication ([place])   . Elevated blood sugar   . Family history of breast cancer in mother   . Fibroid uterus   . History of pulmonary embolus (PE)   . Menopause   . Mild dysplasia of cervix  09/2013   repeat pap done -ecc neg, cerival bx cin 1  . Retinal detachment     Social History   Social History  . Marital status: Married    Spouse name: N/A  . Number of children: N/A  . Years of education: N/A   Occupational History  . Not on file.   Social History Main Topics  . Smoking status: Never Smoker  . Smokeless tobacco: Never Used  . Alcohol use No  . Drug use: No  . Sexual activity: No   Other Topics Concern  . Not on file   Social History Narrative  . No narrative on file    Allergies  Allergen Reactions  . Codeine   . Lmx 4  [Lidocaine]     Review of Systems  Constitutional: Negative.   Eyes: Negative.   Respiratory: Negative.   Cardiovascular: Negative.   Gastrointestinal: Negative.   Genitourinary: Negative.   Musculoskeletal: Negative.   Skin: Negative.   Neurological: Positive for headaches.  Endo/Heme/Allergies: Negative.   Psychiatric/Behavioral: Negative.     Immunization History  Administered Date(s) Administered  . Influenza, High Dose Seasonal PF 09/03/2015, 06/29/2016  . Pneumococcal Conjugate-13 09/03/2015   Objective:  There were no vitals taken for this visit.  Physical Exam  Constitutional: She is oriented to person, place, and time and well-developed, well-nourished, and in no distress.  HENT:  Head: Normocephalic and atraumatic.  Eyes: Conjunctivae and EOM are normal. Pupils are equal, round, and reactive to light.  Neck: Normal range of motion. Neck supple.  Cardiovascular: Normal rate, regular rhythm, normal heart sounds and intact distal pulses.   Pulmonary/Chest: Effort normal and breath sounds normal.  Musculoskeletal: Normal range of motion.  Neurological: She is alert and oriented to person, place, and time. She has normal reflexes. Gait normal. GCS score is 15.  Skin: Skin is warm and dry.  Psychiatric: Mood, memory, affect and judgment normal.    Lab Results  Component Value Date   GLUCOSE 436 (H) 10/16/2015   CHOL 151 04/29/2015   TRIG 132 04/29/2015   HDL 48 04/29/2015   LDLCALC 77 04/29/2015   TSH 1.410 05/13/2015   HGBA1C 11.8 06/10/2016   MICROALBUR 20 09/03/2015    CMP     Component Value Date/Time   NA 133 (L) 10/16/2015 1657   K 4.7 10/16/2015 1657   CL 94 (L) 10/16/2015 1657   CO2 22 10/16/2015 1657   GLUCOSE 436 (H) 10/16/2015 1657   BUN 15 10/16/2015 1657   CREATININE 0.98 10/16/2015 1657   CALCIUM 9.3 10/16/2015 1657   PROT 7.1 05/13/2015 1203   ALBUMIN 4.1 10/16/2015 1657   AST 55 (H) 05/13/2015 1203   ALT 66 (H) 05/13/2015 1203    ALKPHOS 105 05/13/2015 1203   BILITOT 1.1 05/13/2015 1203   GFRNONAA 59 (L) 10/16/2015 1657   GFRAA 68 10/16/2015 1657    Assessment and Plan :  1. Diabetes mellitus type 2 without retinopathy ([place]) Pt feeling better since blood sugars are better. Will max out the metformin to 1000 mg twice daily. If this does not get her to goal will add Januvia.  Patient does not have the body habitus of a type II diabetic but her C-peptide has been normal. 2. GERD  3. Chronic anxiety HPI, Exam, and A&P Transcribed under the direction and in the presence of Richard L. Cranford Mon, MD  Electronically Signed: Webb Laws, [place] MD [facility]  06/30/2016 4:21 PM

## 2016-08-23 DIAGNOSIS — L821 Other seborrheic keratosis: Secondary | ICD-10-CM | POA: Diagnosis not present

## 2016-08-23 DIAGNOSIS — L304 Erythema intertrigo: Secondary | ICD-10-CM | POA: Diagnosis not present

## 2016-08-23 DIAGNOSIS — L570 Actinic keratosis: Secondary | ICD-10-CM | POA: Diagnosis not present

## 2016-08-26 ENCOUNTER — Other Ambulatory Visit: Payer: Self-pay | Admitting: Family Medicine

## 2016-08-26 DIAGNOSIS — K219 Gastro-esophageal reflux disease without esophagitis: Secondary | ICD-10-CM

## 2016-09-10 DIAGNOSIS — M7071 Other bursitis of hip, right hip: Secondary | ICD-10-CM | POA: Diagnosis not present

## 2016-09-13 ENCOUNTER — Ambulatory Visit (INDEPENDENT_AMBULATORY_CARE_PROVIDER_SITE_OTHER): Payer: Medicare Other | Admitting: Family Medicine

## 2016-09-13 VITALS — BP 140/70 | HR 74 | Temp 97.7°F | Resp 16 | Wt 141.0 lb

## 2016-09-13 DIAGNOSIS — F419 Anxiety disorder, unspecified: Secondary | ICD-10-CM

## 2016-09-13 DIAGNOSIS — E119 Type 2 diabetes mellitus without complications: Secondary | ICD-10-CM

## 2016-09-13 DIAGNOSIS — I10 Essential (primary) hypertension: Secondary | ICD-10-CM

## 2016-09-13 MED ORDER — METFORMIN HCL 500 MG PO TABS: 500.0000 mg | ORAL_TABLET | Freq: Every day | ORAL | 3 refills | Status: DC

## 2016-09-13 NOTE — Progress Notes (Signed)
Anna Williams  MRN: [id number] DOB: 1946/01/19  Subjective:  HPI   1. Diabetes mellitus type 2 without retinopathy [facility]) The patient is a 70 year old female who presents for follow up of her diabetes.  Her last A1C was on 06/10/16 and was 11.8.  She was last seen in the office on 06/30/16.  At the time of her last visit management changes included increasing her Metformin to 1000 mg twice daily.  The patient has been checking her glucose and it has been ranging 115-130.  She states she has been having increased diarrhea and it is of note thatt she has lost 8 pounds since her last visit.  The patient states that she has had a few episodes of hypoglycemic symptoms.  She states they have been when she had not eaten for several hour and has not had any in about 1 month.  She states that she has had significant diarrhea since starting the increased dose of Metformin.  2. Anxiety The patient is also here to follow up on her anxiety.  She continues to be under increased stress as a result of her husband's failing health.  She also is having increased stress due to family issues.   Actually her first husband who is having failing health. He continues to help look after him. Have a son he is doing well.     Patient Active Problem List   Diagnosis Date Noted  . Vaginal atrophy 04/29/2016  . Fibroid, uterine 04/29/2016  . Pelvic pain in female 04/29/2016  . Hypertension 10/16/2015  . Diabetes mellitus type 2 without retinopathy ([place]) 05/27/2015  . Menopause 04/29/2015  . Fibroids 04/29/2015  . Anxiety 04/29/2015  . Cervical dysplasia 04/29/2015  . Prediabetes 04/29/2015  . History of pulmonary embolus (PE) 04/29/2015  . Family history of breast cancer in mother 04/29/2015  . Retinal detachment 04/29/2015    Past Medical History:  Diagnosis Date  . Anxiety and depression   . ASCUS with positive high risk HPV 09/2013  . Diabetes mellitus without complication ([place])   . Elevated blood  sugar   . Family history of breast cancer in mother   . Fibroid uterus   . History of pulmonary embolus (PE)   . Menopause   . Mild dysplasia of cervix 09/2013   repeat pap done -ecc neg, cerival bx cin 1  . Retinal detachment     Social History   Social History  . Marital status: Married    Spouse name: N/A  . Number of children: N/A  . Years of education: N/A   Occupational History  . Not on file.   Social History Main Topics  . Smoking status: Never Smoker  . Smokeless tobacco: Never Used  . Alcohol use No  . Drug use: No  . Sexual activity: No   Other Topics Concern  . Not on file   Social History Narrative  . No narrative on file    Outpatient Encounter Prescriptions as of 09/13/2016  Medication Sig Note  . Calcium Citrate-Vitamin D (CALCIUM + D PO) Take 1 tablet by mouth daily.   . citalopram (CELEXA) 10 MG tablet Take 1 tablet (10 mg total) by mouth daily.   . empagliflozin (JARDIANCE) 25 MG TABS tablet Take 25 mg by mouth daily.   Marland Kitchen glucose blood (ONE TOUCH ULTRA TEST) test strip Check sugar once daily   . hydrOXYzine (ATARAX/VISTARIL) 10 MG tablet  04/29/2016: Received from: External Pharmacy  . metFORMIN (GLUCOPHAGE)  1000 MG tablet Take 1 tablet (1,000 mg total) by mouth 2 (two) times daily.   Marland Kitchen omeprazole (PRILOSEC) 20 MG capsule TAKE 1 CAPSULE (20 MG TOTAL) BY MOUTH DAILY.   Marland Kitchen omeprazole (PRILOSEC) 20 MG capsule TAKE 1 CAPSULE (20 MG TOTAL) BY MOUTH DAILY.   Marland Kitchen ONETOUCH DELICA LANCETS FINE MISC Check sugar once daily   . [DISCONTINUED] betamethasone valerate (VALISONE) 0.1 % cream 1 APPLICATION APPLY ON THE SKIN AT BEDTIME 04/29/2016: Received from: External Pharmacy  . [DISCONTINUED] Dermatological Products, Misc. (TETRIX) CREA Apply topically 2 (two) times daily. to affected area 04/29/2016: Received from: External Pharmacy  . [DISCONTINUED] meloxicam (MOBIC) 15 MG tablet  06/10/2016: Received from: External Pharmacy   No facility-administered encounter  medications on file as of 09/13/2016.     Allergies  Allergen Reactions  . Codeine   . Lmx 4 [Lidocaine]     Review of Systems  Constitutional: Positive for malaise/fatigue and weight loss. Negative for fever.  Eyes: Negative.   Respiratory: Negative for cough, shortness of breath and wheezing.   Cardiovascular: Negative for chest pain, palpitations, orthopnea, claudication, leg swelling and PND.  Genitourinary: Negative for urgency.  Neurological: Negative for weakness.  Endo/Heme/Allergies: Negative.  Negative for polydipsia.  Psychiatric/Behavioral: Positive for depression. The patient is nervous/anxious.      Fall Risk  09/13/2016 05/19/2015 05/13/2015  Falls in the past year? No No No   Cognitive Testing - 6-CIT  Correct? Score   What year is it? yes 0 0 or 4  What month is it? yes 0 0 or 3  Memorize:    Pia Mau,  41,  [place],      What time is it? (within 1 hour) yes 0 0 or 3  Count backwards from 20 yes 0 0, 2, or 4  Name the months of the year yes 0 0, 2, or 4  Repeat name & address above yes 0 0, 2, 4, 6, 8, or 10       TOTAL SCORE  0/28   Interpretation:  Normal  Normal (0-7) Abnormal (8-28)     Objective:  BP 140/70 (BP Location: Right Arm, Patient Position: Sitting, Cuff Size: Normal)   Pulse 74   Temp 97.7 F (36.5 C) (Oral)   Resp 16   Wt 141 lb (64 kg)   BMI 22.08 kg/m   Physical Exam  Constitutional: She is oriented to person, place, and time and well-developed, well-nourished, and in no distress.  HENT:  Head: Normocephalic and atraumatic.  Right Ear: External ear normal.  Left Ear: External ear normal.  Nose: Nose normal.  Eyes: Conjunctivae are normal. Pupils are equal, round, and reactive to light.  Neck: Normal range of motion. Neck supple.  Cardiovascular: Normal rate, regular rhythm and normal heart sounds.   Pulmonary/Chest: Effort normal and breath sounds normal.  Abdominal: Soft.  Neurological: She is alert and oriented  to person, place, and time. No cranial nerve deficit. She exhibits normal muscle tone. Gait normal. Coordination normal.  Skin: Skin is warm and dry.  Psychiatric: Mood, memory, affect and judgment normal.    Assessment  and Plan :   1. Diabetes mellitus type 2 without retinopathy ([place])  - Lipid Panel With LDL/HDL Ratio - metFORMIN (GLUCOPHAGE) 500 MG tablet; Take 1 tablet (500 mg total) by mouth daily with breakfast.  Dispense: 90 tablet; Refill: 3 - Hemoglobin A1c  2. Anxiety  3.Depression Stable.  4. Essential hypertension  - CBC  with Differential/Platelet - Comprehensive metabolic panel - Lipid Panel With LDL/HDL Ratio - TSH  HPI, Exam and A&P Transcribed under the direction and in the presence of Wilhemena Durie., MD. Electronically Signed: Althea Charon, RMA I have done the exam and reviewed the chart and it is accurate to the best of my knowledge. Development worker, community has been used and  any errors in dictation or transcription are unintentional. Miguel Aschoff M.D. [facility]

## 2016-09-14 LAB — CBC WITH DIFFERENTIAL/PLATELET
Basophils Absolute: 0 10*3/uL (ref 0.0–0.2)
Basos: 0 %
EOS (ABSOLUTE): 0.1 10*3/uL (ref 0.0–0.4)
Eos: 1 %
Hematocrit: 44.8 % (ref 34.0–46.6)
Hemoglobin: 15.6 g/dL (ref 11.1–15.9)
Immature Grans (Abs): 0 10*3/uL (ref 0.0–0.1)
Immature Granulocytes: 0 %
Lymphocytes Absolute: 2.3 10*3/uL (ref 0.7–3.1)
Lymphs: 38 %
MCH: 32.8 pg (ref 26.6–33.0)
MCHC: 34.8 g/dL (ref 31.5–35.7)
MCV: 94 fL (ref 79–97)
Monocytes Absolute: 0.5 10*3/uL (ref 0.1–0.9)
Monocytes: 8 %
Neutrophils Absolute: 3.2 10*3/uL (ref 1.4–7.0)
Neutrophils: 53 %
Platelets: 173 10*3/uL (ref 150–379)
RBC: 4.75 x10E6/uL (ref 3.77–5.28)
RDW: 13.8 % (ref 12.3–15.4)
WBC: 6.1 10*3/uL (ref 3.4–10.8)

## 2016-09-14 LAB — COMPREHENSIVE METABOLIC PANEL
ALT: 52 IU/L — ABNORMAL HIGH (ref 0–32)
AST: 37 IU/L (ref 0–40)
Albumin/Globulin Ratio: 1.9 (ref 1.2–2.2)
Albumin: 4.5 g/dL (ref 3.5–4.8)
Alkaline Phosphatase: 71 IU/L (ref 39–117)
BUN/Creatinine Ratio: 18 (ref 12–28)
BUN: 12 mg/dL (ref 8–27)
Bilirubin Total: 0.5 mg/dL (ref 0.0–1.2)
CO2: 27 mmol/L (ref 18–29)
Calcium: 9.7 mg/dL (ref 8.7–10.3)
Chloride: 101 mmol/L (ref 96–106)
Creatinine, Ser: 0.66 mg/dL (ref 0.57–1.00)
GFR calc Af Amer: 103 mL/min/{1.73_m2} (ref >59)
GFR calc non Af Amer: 89 mL/min/{1.73_m2} (ref >59)
Globulin, Total: 2.4 g/dL (ref 1.5–4.5)
Glucose: 130 mg/dL — ABNORMAL HIGH (ref 65–99)
Potassium: 4 mmol/L (ref 3.5–5.2)
Sodium: 143 mmol/L (ref 134–144)
Total Protein: 6.9 g/dL (ref 6.0–8.5)

## 2016-09-14 LAB — LIPID PANEL WITH LDL/HDL RATIO
Cholesterol, Total: 141 mg/dL (ref 100–199)
HDL: 56 mg/dL (ref >39)
LDL Calculated: 66 mg/dL (ref 0–99)
LDl/HDL Ratio: 1.2 ratio units (ref 0.0–3.2)
Triglycerides: 97 mg/dL (ref 0–149)
VLDL Cholesterol Cal: 19 mg/dL (ref 5–40)

## 2016-09-14 LAB — HEMOGLOBIN A1C
Est. average glucose Bld gHb Est-mCnc: 131 mg/dL
Hgb A1c MFr Bld: 6.2 % — ABNORMAL HIGH (ref 4.8–5.6)

## 2016-09-14 LAB — TSH: TSH: 3.53 u[IU]/mL (ref 0.450–4.500)

## 2016-09-15 DIAGNOSIS — E113393 Type 2 diabetes mellitus with moderate nonproliferative diabetic retinopathy without macular edema, bilateral: Secondary | ICD-10-CM | POA: Diagnosis not present

## 2016-09-20 ENCOUNTER — Encounter (INDEPENDENT_AMBULATORY_CARE_PROVIDER_SITE_OTHER): Payer: Medicare Other | Admitting: Ophthalmology

## 2016-09-20 DIAGNOSIS — H35341 Macular cyst, hole, or pseudohole, right eye: Secondary | ICD-10-CM | POA: Diagnosis not present

## 2016-09-20 DIAGNOSIS — H59031 Cystoid macular edema following cataract surgery, right eye: Secondary | ICD-10-CM

## 2016-09-20 DIAGNOSIS — H338 Other retinal detachments: Secondary | ICD-10-CM

## 2016-09-20 DIAGNOSIS — H35371 Puckering of macula, right eye: Secondary | ICD-10-CM

## 2016-09-20 DIAGNOSIS — H43813 Vitreous degeneration, bilateral: Secondary | ICD-10-CM | POA: Diagnosis not present

## 2016-09-29 ENCOUNTER — Telehealth: Payer: Self-pay | Admitting: Family Medicine

## 2016-09-29 NOTE — Telephone Encounter (Signed)
I do not know what this is about.

## 2016-09-29 NOTE — Telephone Encounter (Signed)
Pt states she requesting a letter lat week about her blood sugar and weight loss.  Pt is asking if this will be ready this week?  CB#[phone]/MW

## 2016-09-29 NOTE — Telephone Encounter (Signed)
Please review, do you know anything about this?-aa I think I re call seen something come through about this last week to you on green chart?-aa

## 2016-09-29 NOTE — Telephone Encounter (Signed)
Spoke to patient and she states that she talked to you about this when she saw you on 09/23/16. She needs a letter stating that she was seen on 06/10/16 and her A1C was 11.1 very elevated and that metformin was increased to 1000 mg BID due to this. That if her sugar gets very elevated that this can cause confusion, also that she experienced weight loss. She states she was also having dizziness at that time and visual issues, she was referred to opthalmologist for the issue with her vision per patient.  When asked what letter is for patient stated you were aware of all of this and the reason she needs it-aa She said to call her if we had any other questions

## 2016-09-30 ENCOUNTER — Encounter: Payer: Self-pay | Admitting: Family Medicine

## 2016-09-30 NOTE — Telephone Encounter (Signed)
She would not tell and said that she talked to you about this, Anna Williams was with you guys on that day-aa

## 2016-09-30 NOTE — Telephone Encounter (Signed)
Letter is written. Should be available in chart. Thank you.

## 2016-09-30 NOTE — Telephone Encounter (Signed)
When I talked to patient yesterday and all that I typed out in the note she needs it to be worded like that and the specific details in my message below to be mentioned in the note-aa

## 2016-09-30 NOTE — Telephone Encounter (Signed)
What is the letter for? 

## 2016-09-30 NOTE — Progress Notes (Addendum)
To Whom It May Concern,  Anna Williams is a patient of mine who has had poorly controlled diabetes and was seen on August 31 2 2017 for this reason. Her A1c was 11.1 and her blood sugar was completely out of control. This can cause confusion, visual problems, weight loss, and associated dizziness. She was started on metformin and seen by ophthalmology for the visual disturbance. She has been compliant with the treatment and the problems are slowly improving. Thank you for your consideration of this matter regarding this nice lady.  Sincerely,  Miguel Aschoff, M.D.

## 2016-10-01 NOTE — Telephone Encounter (Signed)
Pt picked up letter.

## 2016-10-01 NOTE — Telephone Encounter (Signed)
Rewrote note as requested.

## 2016-10-13 DIAGNOSIS — M533 Sacrococcygeal disorders, not elsewhere classified: Secondary | ICD-10-CM | POA: Diagnosis not present

## 2016-10-18 ENCOUNTER — Ambulatory Visit (INDEPENDENT_AMBULATORY_CARE_PROVIDER_SITE_OTHER): Payer: Medicare Other | Admitting: Family Medicine

## 2016-10-18 VITALS — BP 142/80 | HR 72 | Temp 98.0°F | Resp 16 | Wt 139.0 lb

## 2016-10-18 DIAGNOSIS — F419 Anxiety disorder, unspecified: Secondary | ICD-10-CM | POA: Diagnosis not present

## 2016-10-18 MED ORDER — CITALOPRAM HYDROBROMIDE 20 MG PO TABS: 20.0000 mg | ORAL_TABLET | Freq: Every day | ORAL | 3 refills | Status: DC

## 2016-10-18 NOTE — Progress Notes (Signed)
Anna Williams  MRN: [id number] DOB: [birth year]  Subjective:  HPI   The patient is a 71 year female who presents for follow up depression and anxiety.  She states she continues to have increased stress from family members and has a court date this month.  She is having difficulty getting good sleep.  She falls asleep well but she wakes a couple times during the night and when she is sleeping it is not restful.    Patient Active Problem List   Diagnosis Date Noted  . Vaginal atrophy 04/29/2016  . Fibroid, uterine 04/29/2016  . Pelvic pain in female 04/29/2016  . Hypertension 10/16/2015  . Diabetes mellitus type 2 without retinopathy ([place]) 05/27/2015  . Menopause 04/29/2015  . Fibroids 04/29/2015  . Anxiety 04/29/2015  . Cervical dysplasia 04/29/2015  . Prediabetes 04/29/2015  . History of pulmonary embolus (PE) 04/29/2015  . Family history of breast cancer in mother 04/29/2015  . Retinal detachment 04/29/2015    Past Medical History:  Diagnosis Date  . Anxiety and depression   . ASCUS with positive high risk HPV 09/2013  . Diabetes mellitus without complication ([place])   . Elevated blood sugar   . Family history of breast cancer in mother   . Fibroid uterus   . History of pulmonary embolus (PE)   . Menopause   . Mild dysplasia of cervix 09/2013   repeat pap done -ecc neg, cerival bx cin 1  . Retinal detachment     Social History   Social History  . Marital status: Married    Spouse name: N/A  . Number of children: N/A  . Years of education: N/A   Occupational History  . Not on file.   Social History Main Topics  . Smoking status: Never Smoker  . Smokeless tobacco: Never Used  . Alcohol use No  . Drug use: No  . Sexual activity: No   Other Topics Concern  . Not on file   Social History Narrative  . No narrative on file    Outpatient Encounter Prescriptions as of 10/18/2016  Medication Sig Note  . Calcium Citrate-Vitamin D (CALCIUM + D PO) Take 1  tablet by mouth daily.   . citalopram (CELEXA) 10 MG tablet Take 1 tablet (10 mg total) by mouth daily.   . empagliflozin (JARDIANCE) 25 MG TABS tablet Take 25 mg by mouth daily.   Marland Kitchen glucose blood (ONE TOUCH ULTRA TEST) test strip Check sugar once daily   . hydrOXYzine (ATARAX/VISTARIL) 10 MG tablet  04/29/2016: Received from: External Pharmacy  . metFORMIN (GLUCOPHAGE) 500 MG tablet Take 1 tablet (500 mg total) by mouth daily with breakfast.   . Multiple Vitamins-Minerals (PRESERVISION/LUTEIN PO) Take by mouth.   Marland Kitchen omeprazole (PRILOSEC) 20 MG capsule TAKE 1 CAPSULE (20 MG TOTAL) BY MOUTH DAILY.   Marland Kitchen ONETOUCH DELICA LANCETS FINE MISC Check sugar once daily   . [DISCONTINUED] omeprazole (PRILOSEC) 20 MG capsule TAKE 1 CAPSULE (20 MG TOTAL) BY MOUTH DAILY.    No facility-administered encounter medications on file as of 10/18/2016.     Allergies  Allergen Reactions  . Codeine   . Lmx 4 [Lidocaine]     Review of Systems  Constitutional: Positive for malaise/fatigue and weight loss. Negative for fever.  Eyes: Negative.   Respiratory: Negative for cough, shortness of breath and wheezing.   Cardiovascular: Negative for chest pain, palpitations, orthopnea and leg swelling.  Gastrointestinal: Negative.   Musculoskeletal: Positive for back pain (radiates down  her leg, taking Meloxicam per Ortho after they gave her an injection).  Skin: Negative.   Neurological: Negative for weakness.  Psychiatric/Behavioral: Positive for depression. Negative for hallucinations, memory loss, substance abuse and suicidal ideas. The patient is nervous/anxious and has insomnia.     Objective:  BP (!) 142/80 (BP Location: Right Arm, Patient Position: Sitting, Cuff Size: Normal)   Pulse 72   Temp 98 F (36.7 C) (Oral)   Resp 16   Wt 139 lb (63 kg)   BMI 21.77 kg/m   Physical Exam  Constitutional: She is oriented to person, place, and time and well-developed, well-nourished, and in no distress.  HENT:  Head:  Normocephalic and atraumatic.  Right Ear: External ear normal.  Left Ear: External ear normal.  Nose: Nose normal.  Eyes: Pupils are equal, round, and reactive to light. No scleral icterus.  Neck: Normal range of motion. No thyromegaly present.  Cardiovascular: Normal rate, regular rhythm and normal heart sounds.   Pulmonary/Chest: Effort normal and breath sounds normal.  Neurological: She is alert and oriented to person, place, and time. Gait normal.  Skin: Skin is warm and dry.  Psychiatric: Mood, memory, affect and judgment normal.    Assessment and Plan :  1. Anxiety - citalopram (CELEXA) 20 MG tablet; Take 1 tablet (20 mg total) by mouth daily.  Dispense: 90 tablet; Refill: 3 May need to add or change to mirtazapine on next visit. phq9 on next visit.  2. Weight loss I think this is most likely either due to poorly controlled diabetes or her anxiety. Some of the anxiety should improve over the next few months as her court date will be over at the end of this month and her brother is terminally ill with terminal cancer. 3. Poorly controlled type 2 diabetes Follow-up an A1c in 2 months.  HPI, Exam and A&P Transcribed under the direction and in the presence of Miguel Aschoff, Brooke Bonito., MD. Electronically Signed: Althea Charon, RMA I have done the exam and reviewed the chart and it is accurate to the best of my knowledge. Development worker, community has been used and  any errors in dictation or transcription are unintentional. Miguel Aschoff M.D. [facility]

## 2016-11-01 ENCOUNTER — Encounter (INDEPENDENT_AMBULATORY_CARE_PROVIDER_SITE_OTHER): Payer: Medicare Other | Admitting: Ophthalmology

## 2016-11-01 DIAGNOSIS — H59031 Cystoid macular edema following cataract surgery, right eye: Secondary | ICD-10-CM | POA: Diagnosis not present

## 2016-11-01 DIAGNOSIS — H43813 Vitreous degeneration, bilateral: Secondary | ICD-10-CM | POA: Diagnosis not present

## 2016-11-01 DIAGNOSIS — H338 Other retinal detachments: Secondary | ICD-10-CM | POA: Diagnosis not present

## 2016-11-01 DIAGNOSIS — H2513 Age-related nuclear cataract, bilateral: Secondary | ICD-10-CM | POA: Diagnosis not present

## 2016-11-01 LAB — HM DIABETES EYE EXAM

## 2016-11-04 ENCOUNTER — Encounter: Payer: Self-pay | Admitting: Family Medicine

## 2016-11-05 ENCOUNTER — Encounter: Payer: Self-pay | Admitting: Family Medicine

## 2016-12-06 DIAGNOSIS — J019 Acute sinusitis, unspecified: Secondary | ICD-10-CM | POA: Diagnosis not present

## 2016-12-13 ENCOUNTER — Encounter (INDEPENDENT_AMBULATORY_CARE_PROVIDER_SITE_OTHER): Payer: Medicare Other | Admitting: Ophthalmology

## 2016-12-20 ENCOUNTER — Ambulatory Visit: Payer: Medicare Other | Admitting: Family Medicine

## 2016-12-23 ENCOUNTER — Encounter (INDEPENDENT_AMBULATORY_CARE_PROVIDER_SITE_OTHER): Payer: Medicare Other | Admitting: Ophthalmology

## 2016-12-27 ENCOUNTER — Encounter (INDEPENDENT_AMBULATORY_CARE_PROVIDER_SITE_OTHER): Payer: Medicare Other | Admitting: Ophthalmology

## 2016-12-27 DIAGNOSIS — H2513 Age-related nuclear cataract, bilateral: Secondary | ICD-10-CM

## 2016-12-27 DIAGNOSIS — H59031 Cystoid macular edema following cataract surgery, right eye: Secondary | ICD-10-CM | POA: Diagnosis not present

## 2016-12-27 DIAGNOSIS — H43813 Vitreous degeneration, bilateral: Secondary | ICD-10-CM

## 2016-12-27 DIAGNOSIS — H338 Other retinal detachments: Secondary | ICD-10-CM

## 2017-01-04 ENCOUNTER — Ambulatory Visit (INDEPENDENT_AMBULATORY_CARE_PROVIDER_SITE_OTHER): Payer: Medicare Other | Admitting: Family Medicine

## 2017-01-04 ENCOUNTER — Ambulatory Visit: Payer: Medicare Other | Admitting: Family Medicine

## 2017-01-04 ENCOUNTER — Encounter: Payer: Self-pay | Admitting: Family Medicine

## 2017-01-04 VITALS — BP 116/72 | Temp 98.7°F | Resp 16 | Wt 142.0 lb

## 2017-01-04 DIAGNOSIS — F419 Anxiety disorder, unspecified: Secondary | ICD-10-CM | POA: Diagnosis not present

## 2017-01-04 DIAGNOSIS — E119 Type 2 diabetes mellitus without complications: Secondary | ICD-10-CM | POA: Diagnosis not present

## 2017-01-04 LAB — POCT GLYCOSYLATED HEMOGLOBIN (HGB A1C): Hemoglobin A1C: 6.7

## 2017-01-04 NOTE — Progress Notes (Signed)
Patient: Anna Williams Female    DOB: 1946/08/19   71 y.o.   MRN: [id number] Visit Date: 01/04/2017  Today's Provider: Wilhemena Durie, MD   Chief Complaint  Patient presents with  . Diabetes  . Anxiety   Subjective:    HPI  Diabetes Mellitus Type II, Follow-up:   Lab Results  Component Value Date   HGBA1C 6.2 (H) 09/13/2016   HGBA1C 11.8 06/10/2016   HGBA1C 6.8 09/03/2015    Last seen for diabetes 3 months ago.  Management since then includes no changes. She reports good compliance with treatment. She is not having side effects.  Current symptoms include none and have been stable. Home blood sugar records: fasting range: 120s  Episodes of hypoglycemia? no   Current Insulin Regimen: none Most Recent Eye Exam: due Weight trend: stable Prior visit with dietician: no Current diet: well balanced Current exercise: none  Pertinent Labs:    Component Value Date/Time   CHOL 141 09/13/2016 0958   TRIG 97 09/13/2016 0958   HDL 56 09/13/2016 0958   LDLCALC 66 09/13/2016 0958   CREATININE 0.66 09/13/2016 0958    Wt Readings from Last 3 Encounters:  01/04/17 142 lb (64.4 kg)  10/18/16 139 lb (63 kg)  09/13/16 141 lb (64 kg)     Anxiety, follow up: Patient comes in today for a 2 month follow up on anxiety. Patient reports that since her last OV, citalopram was increased to 20mg  daily. Patient reports that overall her mood has improved.     Allergies  Allergen Reactions  . Codeine   . Lmx 4 [Lidocaine]      Current Outpatient Prescriptions:  .  Calcium Citrate-Vitamin D (CALCIUM + D PO), Take 1 tablet by mouth daily., Disp: , Rfl:  .  citalopram (CELEXA) 20 MG tablet, Take 1 tablet (20 mg total) by mouth daily., Disp: 90 tablet, Rfl: 3 .  empagliflozin (JARDIANCE) 25 MG TABS tablet, Take 25 mg by mouth daily., Disp: 30 tablet, Rfl: 12 .  glucose blood (ONE TOUCH ULTRA TEST) test strip, Check sugar once daily, Disp: 100 each, Rfl: 12 .   hydrOXYzine (ATARAX/VISTARIL) 10 MG tablet, , Disp: , Rfl:  .  meloxicam (MOBIC) 15 MG tablet, Take 15 mg by mouth daily., Disp: , Rfl:  .  metFORMIN (GLUCOPHAGE) 500 MG tablet, Take 1 tablet (500 mg total) by mouth daily with breakfast., Disp: 90 tablet, Rfl: 3 .  Multiple Vitamins-Minerals (PRESERVISION/LUTEIN PO), Take by mouth., Disp: , Rfl:  .  omeprazole (PRILOSEC) 20 MG capsule, TAKE 1 CAPSULE (20 MG TOTAL) BY MOUTH DAILY., Disp: 30 capsule, Rfl: 12 .  ONETOUCH DELICA LANCETS FINE MISC, Check sugar once daily, Disp: 50 each, Rfl: 12  Review of Systems  Constitutional: Positive for fatigue. Negative for activity change, appetite change, chills, diaphoresis, fever and unexpected weight change.  Eyes: Negative.   Respiratory: Negative.   Cardiovascular: Negative.   Endocrine: Negative.   Genitourinary: Negative.   Skin: Negative.   Allergic/Immunologic: Negative.   Neurological: Negative.   Psychiatric/Behavioral: Negative for agitation, self-injury, sleep disturbance and suicidal ideas. The patient is not nervous/anxious.     Social History  Substance Use Topics  . Smoking status: Never Smoker  . Smokeless tobacco: Never Used  . Alcohol use No   Objective:   BP 116/72 (BP Location: Right Arm, Patient Position: Sitting, Cuff Size: Normal)   Temp 98.7 F (37.1 C)   Resp 16  Wt 142 lb (64.4 kg)   BMI 22.24 kg/m  Vitals:   01/04/17 1127  BP: 116/72  Resp: 16  Temp: 98.7 F (37.1 C)  Weight: 142 lb (64.4 kg)     Physical Exam  Constitutional: She is oriented to person, place, and time. She appears well-developed and well-nourished.  HENT:  Head: Normocephalic and atraumatic.  Right Ear: External ear normal.  Left Ear: External ear normal.  Nose: Nose normal.  Eyes: Conjunctivae are normal. No scleral icterus.  Neck: No thyromegaly present.  Cardiovascular: Normal rate, regular rhythm and normal heart sounds.   Pulmonary/Chest: Effort normal.  Abdominal: Soft.    Musculoskeletal: She exhibits no edema.  Neurological: She is alert and oriented to person, place, and time.  Skin: Skin is warm and dry.  Psychiatric: She has a normal mood and affect. Her behavior is normal. Judgment and thought content normal.        Assessment & Plan:     1. Diabetes mellitus type 2 without retinopathy ([place]) Controlled. - POCT glycosylated hemoglobin (Hb A1C)--6.7 today.  2. Anxiety Stable.       I have done the exam and reviewed the above chart and it is accurate to the best of my knowledge. Development worker, community has been used in this note in any air is in the dictation or transcription are unintentional.  Wilhemena Durie, MD  [place]

## 2017-01-04 NOTE — Patient Instructions (Signed)
Call Eugenia Pancoast through [facility].

## 2017-02-02 ENCOUNTER — Ambulatory Visit: Payer: Medicare Other | Admitting: Family Medicine

## 2017-02-17 ENCOUNTER — Ambulatory Visit (INDEPENDENT_AMBULATORY_CARE_PROVIDER_SITE_OTHER): Payer: Medicare Other | Admitting: Family Medicine

## 2017-02-17 VITALS — BP 118/62 | HR 80 | Temp 97.9°F | Resp 16 | Wt 146.0 lb

## 2017-02-17 DIAGNOSIS — F419 Anxiety disorder, unspecified: Secondary | ICD-10-CM

## 2017-02-17 DIAGNOSIS — E119 Type 2 diabetes mellitus without complications: Secondary | ICD-10-CM

## 2017-02-17 DIAGNOSIS — K219 Gastro-esophageal reflux disease without esophagitis: Secondary | ICD-10-CM | POA: Diagnosis not present

## 2017-02-17 NOTE — Progress Notes (Signed)
Anna Williams  MRN: [id number] DOB: [birth year]  Subjective:  HPI  Patient is here for 1 month follow up. Patient is taking Citalopram 20 mg which was increased 2 office visits ago and patient feels like that does not need to be changed at this time. Depression screen [facility] 2/9 01/04/2017 09/13/2016 05/19/2015  Decreased Interest 1 1 0  Down, Depressed, Hopeless 1 1 0  PHQ - 2 Score 2 2 0  Altered sleeping 3 - -  Tired, decreased energy 1 - -  Change in appetite 0 - -  Feeling bad or failure about yourself  1 - -  Trouble concentrating 0 - -  Moving slowly or fidgety/restless 0 - -  Suicidal thoughts 0 - -  PHQ-9 Score 7 - -  Difficult doing work/chores Not difficult at all - -    Patient Active Problem List   Diagnosis Date Noted  . Vaginal atrophy 04/29/2016  . Fibroid, uterine 04/29/2016  . Pelvic pain in female 04/29/2016  . Hypertension 10/16/2015  . Diabetes mellitus type 2 without retinopathy ([place]) 05/27/2015  . Menopause 04/29/2015  . Fibroids 04/29/2015  . Anxiety 04/29/2015  . Cervical dysplasia 04/29/2015  . Prediabetes 04/29/2015  . History of pulmonary embolus (PE) 04/29/2015  . Family history of breast cancer in mother 04/29/2015  . Retinal detachment 04/29/2015    Past Medical History:  Diagnosis Date  . Anxiety and depression   . ASCUS with positive high risk HPV 09/2013  . Diabetes mellitus without complication ([place])   . Elevated blood sugar   . Family history of breast cancer in mother   . Fibroid uterus   . History of pulmonary embolus (PE)   . Menopause   . Mild dysplasia of cervix 09/2013   repeat pap done -ecc neg, cerival bx cin 1  . Retinal detachment     Social History   Social History  . Marital status: Married    Spouse name: N/A  . Number of children: N/A  . Years of education: N/A   Occupational History  . Not on file.   Social History Main Topics  . Smoking status: Never Smoker  . Smokeless tobacco: Never Used  .  Alcohol use No  . Drug use: No  . Sexual activity: No   Other Topics Concern  . Not on file   Social History Narrative  . No narrative on file    Outpatient Encounter Prescriptions as of 02/17/2017  Medication Sig Note  . Calcium Citrate-Vitamin D (CALCIUM + D PO) Take 1 tablet by mouth daily.   . citalopram (CELEXA) 20 MG tablet Take 1 tablet (20 mg total) by mouth daily.   . empagliflozin (JARDIANCE) 25 MG TABS tablet Take 25 mg by mouth daily.   Marland Kitchen glucose blood (ONE TOUCH ULTRA TEST) test strip Check sugar once daily   . hydrOXYzine (ATARAX/VISTARIL) 10 MG tablet  04/29/2016: Received from: External Pharmacy  . meloxicam (MOBIC) 15 MG tablet Take 15 mg by mouth daily.   . metFORMIN (GLUCOPHAGE) 500 MG tablet Take 1 tablet (500 mg total) by mouth daily with breakfast.   . Multiple Vitamins-Minerals (PRESERVISION/LUTEIN PO) Take by mouth.   Marland Kitchen omeprazole (PRILOSEC) 20 MG capsule TAKE 1 CAPSULE (20 MG TOTAL) BY MOUTH DAILY.   Marland Kitchen ONETOUCH DELICA LANCETS FINE MISC Check sugar once daily    No facility-administered encounter medications on file as of 02/17/2017.     Allergies  Allergen Reactions  . Codeine   .  Lmx 4 [Lidocaine]     Review of Systems  Constitutional: Positive for malaise/fatigue.  Respiratory: Negative.   Cardiovascular: Negative.   Musculoskeletal: Negative.   Psychiatric/Behavioral: Negative.        Stable on medication    Objective:  BP 118/62   Pulse 80   Temp 97.9 F (36.6 C)   Resp 16   Wt 146 lb (66.2 kg)   BMI 22.87 kg/m   Physical Exam  Constitutional: She is oriented to person, place, and time and well-developed, well-nourished, and in no distress.  Eyes: Conjunctivae are normal. Pupils are equal, round, and reactive to light.  Cardiovascular: Normal rate, regular rhythm, normal heart sounds and intact distal pulses.  Exam reveals no gallop.   No murmur heard. Pulmonary/Chest: Effort normal and breath sounds normal. No respiratory distress.  She has no wheezes.  Neurological: She is alert and oriented to person, place, and time.   Assessment and Plan :  1. Anxiety Stable at this time. Patient has increased stress with family health and things she is having to take care of. Advised patient to get in touch with counselor  Ovid Curd at [facility].  2. Diabetes mellitus type 2 without retinopathy ([place]) A1C onnext OV 3. Gastroesophageal reflux disease, esophagitis presence not specified Stable.  HPI, Exam and A&P transcribed by Theressa Millard, RMA under direction and in the presence of Miguel Aschoff, MD. I have done the exam and reviewed the chart and it is accurate to the best of my knowledge. Development worker, community has been used and  any errors in dictation or transcription are unintentional. Miguel Aschoff M.D. [facility]

## 2017-02-17 NOTE — Patient Instructions (Signed)
Eugenia Pancoast- counselor at [facility] phone number is [phone]

## 2017-03-02 DIAGNOSIS — L570 Actinic keratosis: Secondary | ICD-10-CM | POA: Diagnosis not present

## 2017-03-02 DIAGNOSIS — L308 Other specified dermatitis: Secondary | ICD-10-CM | POA: Diagnosis not present

## 2017-03-14 DIAGNOSIS — J019 Acute sinusitis, unspecified: Secondary | ICD-10-CM | POA: Diagnosis not present

## 2017-03-14 DIAGNOSIS — M545 Low back pain: Secondary | ICD-10-CM | POA: Diagnosis not present

## 2017-03-29 DIAGNOSIS — J019 Acute sinusitis, unspecified: Secondary | ICD-10-CM | POA: Diagnosis not present

## 2017-03-29 DIAGNOSIS — H6592 Unspecified nonsuppurative otitis media, left ear: Secondary | ICD-10-CM | POA: Diagnosis not present

## 2017-04-28 ENCOUNTER — Ambulatory Visit (INDEPENDENT_AMBULATORY_CARE_PROVIDER_SITE_OTHER): Payer: Medicare Other | Admitting: Ophthalmology

## 2017-04-28 DIAGNOSIS — H35341 Macular cyst, hole, or pseudohole, right eye: Secondary | ICD-10-CM

## 2017-04-28 DIAGNOSIS — H59031 Cystoid macular edema following cataract surgery, right eye: Secondary | ICD-10-CM

## 2017-04-28 DIAGNOSIS — H35371 Puckering of macula, right eye: Secondary | ICD-10-CM

## 2017-04-28 DIAGNOSIS — H43813 Vitreous degeneration, bilateral: Secondary | ICD-10-CM

## 2017-05-04 ENCOUNTER — Ambulatory Visit (INDEPENDENT_AMBULATORY_CARE_PROVIDER_SITE_OTHER): Payer: Medicare Other | Admitting: Obstetrics and Gynecology

## 2017-05-04 ENCOUNTER — Encounter: Payer: Self-pay | Admitting: Obstetrics and Gynecology

## 2017-05-04 VITALS — BP 120/68 | HR 66 | Ht 67.0 in | Wt 146.8 lb

## 2017-05-04 DIAGNOSIS — N952 Postmenopausal atrophic vaginitis: Secondary | ICD-10-CM

## 2017-05-04 DIAGNOSIS — Z1211 Encounter for screening for malignant neoplasm of colon: Secondary | ICD-10-CM

## 2017-05-04 DIAGNOSIS — Z1231 Encounter for screening mammogram for malignant neoplasm of breast: Secondary | ICD-10-CM | POA: Diagnosis not present

## 2017-05-04 DIAGNOSIS — Z780 Asymptomatic menopausal state: Secondary | ICD-10-CM | POA: Diagnosis not present

## 2017-05-04 DIAGNOSIS — L292 Pruritus vulvae: Secondary | ICD-10-CM | POA: Diagnosis not present

## 2017-05-04 DIAGNOSIS — F419 Anxiety disorder, unspecified: Secondary | ICD-10-CM | POA: Diagnosis not present

## 2017-05-04 MED ORDER — CITALOPRAM HYDROBROMIDE 20 MG PO TABS: 20.0000 mg | ORAL_TABLET | Freq: Every day | ORAL | 3 refills | Status: DC

## 2017-05-04 NOTE — Progress Notes (Signed)
ANNUAL PREVENTATIVE CARE GYN  ENCOUNTER NOTE  Subjective:       Anna Williams is a 71 y.o. G109P2002 female here for a routine annual gynecologic exam.  Current complaints: 1.  Vaginal irritation- itch at times  Husband reportedly also has perineal irritation possibly associated with monilia infection Bowel function is normal Bladder function is normal Patient does not sexually active. 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   Gynecologic History No LMP recorded. Patient is postmenopausal. Contraception: post menopausal status Last Pap: 2016 neg. Results were: normal Last mammogram: 06/2016 birad 1. Results were: normal  Obstetric History OB History  Gravida Para Term Preterm AB Living  2 2 2     2   SAB TAB Ectopic Multiple Live Births          2    # Outcome Date GA Lbr Len/2nd Weight Sex Delivery Anes PTL Lv  2 Term 1978    M Vag-Spont   LIV  1 Term 1964    M Vag-Spont   LIV      Past Medical History:  Diagnosis Date  . Anxiety and depression   . ASCUS with positive high risk HPV 09/2013  . Diabetes mellitus without complication ([place])   . Elevated blood sugar   . Family history of breast cancer in mother   . Fibroid uterus   . History of pulmonary embolus (PE)   . Menopause   . Mild dysplasia of cervix 09/2013   repeat pap done -ecc neg, cerival bx cin 1  . Retinal detachment     Past Surgical History:  Procedure Laterality Date  . anterior neck fusion    . BREAST CYST ASPIRATION Left yrs ago  . CHOLECYSTECTOMY  2010  . DILATION AND CURETTAGE OF UTERUS    . HYSTEROSCOPY    . mole removed  2014    Current Outpatient Prescriptions on File Prior to Visit  Medication Sig Dispense Refill  . Calcium Citrate-Vitamin D (CALCIUM + D PO) Take 1 tablet by mouth daily.    . citalopram (CELEXA) 20 MG tablet Take 1 tablet (20 mg total) by mouth daily. 90 tablet 3  . empagliflozin (JARDIANCE) 25 MG TABS tablet  Take 25 mg by mouth daily. 30 tablet 12  . glucose blood (ONE TOUCH ULTRA TEST) test strip Check sugar once daily 100 each 12  . hydrOXYzine (ATARAX/VISTARIL) 10 MG tablet     . meloxicam (MOBIC) 15 MG tablet Take 15 mg by mouth daily.    . metFORMIN (GLUCOPHAGE) 500 MG tablet Take 1 tablet (500 mg total) by mouth daily with breakfast. 90 tablet 3  . Multiple Vitamins-Minerals (PRESERVISION/LUTEIN PO) Take by mouth.    Marland Kitchen omeprazole (PRILOSEC) 20 MG capsule TAKE 1 CAPSULE (20 MG TOTAL) BY MOUTH DAILY. 30 capsule 12  . ONETOUCH DELICA LANCETS FINE MISC Check sugar once daily 50 each 12   No current facility-administered medications on file prior to visit.     Allergies  Allergen Reactions  . Codeine   . Lmx 4 [Lidocaine]     Social History   Social History  . Marital status: Married    Spouse name: N/A  . Number of children: N/A  . Years of education: N/A   Occupational History  . Not on file.   Social History Main Topics  . Smoking status: Never Smoker  . Smokeless tobacco: Never Used  . Alcohol use No  .  Drug use: No  . Sexual activity: No   Other Topics Concern  . Not on file   Social History Narrative  . No narrative on file    Family History  Problem Relation Age of Onset  . Breast cancer Mother 67  . Heart disease Father   . Throat cancer Brother   . Breast cancer Maternal Aunt   . Colon cancer Neg Hx   . Ovarian cancer Neg Hx     The following portions of the patient's history were reviewed and updated as appropriate: allergies, current medications, past family history, past medical history, past social history, past surgical history and problem list.  Review of Systems Review of Systems  Constitutional: Positive for weight loss.       No significant vasomotor symptoms Approximate 15 pound weight loss secondary to stressors  HENT: Negative.   Eyes: Negative.   Respiratory: Negative.   Cardiovascular: Negative.   Gastrointestinal: Negative.    Genitourinary: Negative.   Musculoskeletal: Negative.   Skin: Positive for itching.       Occasional vulvar itching; no recent treatment  Neurological: Negative.   Endo/Heme/Allergies: Negative.   Psychiatric/Behavioral: Negative.       Objective:   BP 120/68   Pulse 66   Ht 5\' 7"  (1.702 m)   Wt 146 lb 12.8 oz (66.6 kg)   BMI 22.99 kg/m  CONSTITUTIONAL: Well-developed, well-nourished female in no acute distress.  PSYCHIATRIC: Normal mood and affect. Normal behavior. Normal judgment and thought content. [place]: Alert and oriented to person, place, and time. Normal muscle tone coordination. No cranial nerve deficit noted. HENT:  Normocephalic, atraumatic, External right and left ear normal. Oropharynx is clear and moist EYES: Conjunctivae and EOM are normal. No scleral icterus.  NECK: Normal range of motion, supple, no masses.  Normal thyroid.  SKIN: Skin is warm and dry. No rash noted. Not diaphoretic. No erythema. No pallor. CARDIOVASCULAR: Normal heart rate noted, regular rhythm, no murmur. RESPIRATORY: Clear to auscultation bilaterally. Effort and breath sounds normal, no problems with respiration noted. BREASTS: Symmetric in size. No masses, skin changes, nipple drainage, or lymphadenopathy. ABDOMEN: Soft, normal bowel sounds, no distention noted.  No tenderness, rebound or guarding.  BLADDER: Normal PELVIC:  External Genitalia: Normal; no rash, erythema, or satellite lesions; no ulceration  BUS: Normal  Vagina: Moderate atrophy no discharge  Cervix: Normal; no lesions  Uterus: Normal; midplane, normal size and shape, nontender  Adnexa: Normal; nonpalpable nontender  RV: External Exam NormaI, No Rectal Masses and Normal Sphincter tone  MUSCULOSKELETAL: Normal range of motion. No tenderness.  No cyanosis, clubbing, or edema.  2+ distal pulses. LYMPHATIC: No Axillary, Supraclavicular, or Inguinal Adenopathy.    Assessment:   Annual gynecologic examination 71  y.o. Contraception: post menopausal status Normal BMI Vaginal atrophy Vulvar itching, intermittent; normal exam today  Plan:  Pap: Not needed Mammogram: Ordered Stool Guaiac Testing:  Ordered Labs: thru pcp Routine preventative health maintenance measures emphasized: Exercise/Diet/Weight control, Tobacco Warnings and Alcohol/Substance use risks Trial of Monistat for possible monilia vulvovaginitis. If symptoms persist after treatment, return for evaluation. Return to [place], [place]  Brayton Mars, MD  Note: This dictation was prepared with Dragon dictation along with smaller phrase technology. Any transcriptional errors that result from this process are unintentional.

## 2017-05-04 NOTE — Patient Instructions (Signed)
1. No Pap smear is done 2. Mammogram is ordered 3. Stool guaiac cards are given for colon cancer screening 4. Continue with healthy eating and exercise 5. Continue with calcium and vitamin D supplementation 6. Screening labs are to be changed through primary care 7. Return in 1 year for gynecologic exam 8. Self treated with Monistat cream for vulvar irritation; if symptoms persist after treatment, return for reevaluation   Health Maintenance for Postmenopausal Women Menopause is a normal process in which your reproductive ability comes to an end. This process happens gradually over a span of months to years, usually between the ages of 30 and 65. Menopause is complete when you have missed 12 consecutive menstrual periods. It is important to talk with your health care provider about some of the most common conditions that affect postmenopausal women, such as heart disease, cancer, and bone loss (osteoporosis). Adopting a healthy lifestyle and getting preventive care can help to promote your health and wellness. Those actions can also lower your chances of developing some of these common conditions. What should I know about menopause? During menopause, you may experience a number of symptoms, such as:  Moderate-to-severe hot flashes.  Night sweats.  Decrease in sex drive.  Mood swings.  Headaches.  Tiredness.  Irritability.  Memory problems.  Insomnia.  Choosing to treat or not to treat menopausal changes is an individual decision that you make with your health care provider. What should I know about hormone replacement therapy and supplements? Hormone therapy products are effective for treating symptoms that are associated with menopause, such as hot flashes and night sweats. Hormone replacement carries certain risks, especially as you become older. If you are thinking about using estrogen or estrogen with progestin treatments, discuss the benefits and risks with your health care  provider. What should I know about heart disease and stroke? Heart disease, heart attack, and stroke become more likely as you age. This may be due, in part, to the hormonal changes that your body experiences during menopause. These can affect how your body processes dietary fats, triglycerides, and cholesterol. Heart attack and stroke are both medical emergencies. There are many things that you can do to help prevent heart disease and stroke:  Have your blood pressure checked at least every 1-2 years. High blood pressure causes heart disease and increases the risk of stroke.  If you are 29-92 years old, ask your health care provider if you should take aspirin to prevent a heart attack or a stroke.  Do not use any tobacco products, including cigarettes, chewing tobacco, or electronic cigarettes. If you need help quitting, ask your health care provider.  It is important to eat a healthy diet and maintain a healthy weight. ? Be sure to include plenty of vegetables, fruits, low-fat dairy products, and lean protein. ? Avoid eating foods that are high in solid fats, added sugars, or salt (sodium).  Get regular exercise. This is one of the most important things that you can do for your health. ? Try to exercise for at least 150 minutes each week. The type of exercise that you do should increase your heart rate and make you sweat. This is known as moderate-intensity exercise. ? Try to do strengthening exercises at least twice each week. Do these in addition to the moderate-intensity exercise.  Know your numbers.Ask your health care provider to check your cholesterol and your blood glucose. Continue to have your blood tested as directed by your health care provider.  What should  I know about cancer screening? There are several types of cancer. Take the following steps to reduce your risk and to catch any cancer development as early as possible. Breast Cancer  Practice breast self-awareness. ? This  means understanding how your breasts normally appear and feel. ? It also means doing regular breast self-exams. Let your health care provider know about any changes, no matter how small.  If you are 87 or older, have a clinician do a breast exam (clinical breast exam or CBE) every year. Depending on your age, family history, and medical history, it may be recommended that you also have a yearly breast X-ray (mammogram).  If you have a family history of breast cancer, talk with your health care provider about genetic screening.  If you are at high risk for breast cancer, talk with your health care provider about having an MRI and a mammogram every year.  Breast cancer (BRCA) gene test is recommended for women who have family members with BRCA-related cancers. Results of the assessment will determine the need for genetic counseling and BRCA1 and for BRCA2 testing. BRCA-related cancers include these types: ? Breast. This occurs in males or females. ? Ovarian. ? Tubal. This may also be called fallopian tube cancer. ? Cancer of the abdominal or pelvic lining (peritoneal cancer). ? Prostate. ? Pancreatic.  Cervical, Uterine, and Ovarian Cancer Your health care provider may recommend that you be screened regularly for cancer of the pelvic organs. These include your ovaries, uterus, and vagina. This screening involves a pelvic exam, which includes checking for microscopic changes to the surface of your cervix (Pap test).  For women ages 21-65, health care providers may recommend a pelvic exam and a Pap test every three years. For women ages 40-65, they may recommend the Pap test and pelvic exam, combined with testing for human papilloma virus (HPV), every five years. Some types of HPV increase your risk of cervical cancer. Testing for HPV may also be done on women of any age who have unclear Pap test results.  Other health care providers may not recommend any screening for nonpregnant women who are  considered low risk for pelvic cancer and have no symptoms. Ask your health care provider if a screening pelvic exam is right for you.  If you have had past treatment for cervical cancer or a condition that could lead to cancer, you need Pap tests and screening for cancer for at least 20 years after your treatment. If Pap tests have been discontinued for you, your risk factors (such as having a new sexual partner) need to be reassessed to determine if you should start having screenings again. Some women have medical problems that increase the chance of getting cervical cancer. In these cases, your health care provider may recommend that you have screening and Pap tests more often.  If you have a family history of uterine cancer or ovarian cancer, talk with your health care provider about genetic screening.  If you have vaginal bleeding after reaching menopause, tell your health care provider.  There are currently no reliable tests available to screen for ovarian cancer.  Lung Cancer Lung cancer screening is recommended for adults 61-52 years old who are at high risk for lung cancer because of a history of smoking. A yearly low-dose CT scan of the lungs is recommended if you:  Currently smoke.  Have a history of at least 30 pack-years of smoking and you currently smoke or have quit within the past 15 years.  A pack-year is smoking an average of one pack of cigarettes per day for one year.  Yearly screening should:  Continue until it has been 15 years since you quit.  Stop if you develop a health problem that would prevent you from having lung cancer treatment.  Colorectal Cancer  This type of cancer can be detected and can often be prevented.  Routine colorectal cancer screening usually begins at age 69 and continues through age 105.  If you have risk factors for colon cancer, your health care provider may recommend that you be screened at an earlier age.  If you have a family history of  colorectal cancer, talk with your health care provider about genetic screening.  Your health care provider may also recommend using home test kits to check for hidden blood in your stool.  A small camera at the end of a tube can be used to examine your colon directly (sigmoidoscopy or colonoscopy). This is done to check for the earliest forms of colorectal cancer.  Direct examination of the colon should be repeated every 5-10 years until age 83. However, if early forms of precancerous polyps or small growths are found or if you have a family history or genetic risk for colorectal cancer, you may need to be screened more often.  Skin Cancer  Check your skin from head to toe regularly.  Monitor any moles. Be sure to tell your health care provider: ? About any new moles or changes in moles, especially if there is a change in a mole's shape or color. ? If you have a mole that is larger than the size of a pencil eraser.  If any of your family members has a history of skin cancer, especially at a young age, talk with your health care provider about genetic screening.  Always use sunscreen. Apply sunscreen liberally and repeatedly throughout the day.  Whenever you are outside, protect yourself by wearing long sleeves, pants, a wide-brimmed hat, and sunglasses.  What should I know about osteoporosis? Osteoporosis is a condition in which bone destruction happens more quickly than new bone creation. After menopause, you may be at an increased risk for osteoporosis. To help prevent osteoporosis or the bone fractures that can happen because of osteoporosis, the following is recommended:  If you are 65-43 years old, get at least 1,000 mg of calcium and at least 600 mg of vitamin D per day.  If you are older than age 14 but younger than age 54, get at least 1,200 mg of calcium and at least 600 mg of vitamin D per day.  If you are older than age 92, get at least 1,200 mg of calcium and at least 800 mg  of vitamin D per day.  Smoking and excessive alcohol intake increase the risk of osteoporosis. Eat foods that are rich in calcium and vitamin D, and do weight-bearing exercises several times each week as directed by your health care provider. What should I know about how menopause affects my mental health? Depression may occur at any age, but it is more common as you become older. Common symptoms of depression include:  Low or sad mood.  Changes in sleep patterns.  Changes in appetite or eating patterns.  Feeling an overall lack of motivation or enjoyment of activities that you previously enjoyed.  Frequent crying spells.  Talk with your health care provider if you think that you are experiencing depression. What should I know about immunizations? It is important that you  get and maintain your immunizations. These include:  Tetanus, diphtheria, and pertussis (Tdap) booster vaccine.  Influenza every year before the flu season begins.  Pneumonia vaccine.  Shingles vaccine.  Your health care provider may also recommend other immunizations. This information is not intended to replace advice given to you by your health care provider. Make sure you discuss any questions you have with your health care provider. Document Released: 11/19/2005 Document Revised: 04/16/2016 Document Reviewed: 07/01/2015 Elsevier Interactive Patient Education  2018 [employer].

## 2017-05-25 ENCOUNTER — Encounter: Payer: Self-pay | Admitting: Family Medicine

## 2017-05-25 ENCOUNTER — Ambulatory Visit (INDEPENDENT_AMBULATORY_CARE_PROVIDER_SITE_OTHER): Payer: Medicare Other | Admitting: Family Medicine

## 2017-05-25 VITALS — BP 126/74 | HR 78 | Temp 97.9°F | Resp 16 | Wt 151.0 lb

## 2017-05-25 DIAGNOSIS — I10 Essential (primary) hypertension: Secondary | ICD-10-CM | POA: Diagnosis not present

## 2017-05-25 DIAGNOSIS — R591 Generalized enlarged lymph nodes: Secondary | ICD-10-CM

## 2017-05-25 DIAGNOSIS — F419 Anxiety disorder, unspecified: Secondary | ICD-10-CM

## 2017-05-25 DIAGNOSIS — E119 Type 2 diabetes mellitus without complications: Secondary | ICD-10-CM | POA: Diagnosis not present

## 2017-05-25 LAB — POCT UA - MICROALBUMIN: Microalbumin Ur, POC: 20 mg/L

## 2017-05-25 LAB — POCT GLYCOSYLATED HEMOGLOBIN (HGB A1C): Hemoglobin A1C: 7

## 2017-05-25 NOTE — Progress Notes (Signed)
Subjective:  HPI  Diabetes Mellitus Type II, Follow-up:   Lab Results  Component Value Date   HGBA1C 7.0 05/25/2017   HGBA1C 6.7 01/04/2017   HGBA1C 6.2 (H) 09/13/2016    Last seen for diabetes 3 months ago.  Management since then includes none. She reports good compliance with treatment. She is not having side effects.  Home blood sugar records: 120-130's  Episodes of hypoglycemia? yes - but had not eaten.    Current Insulin Regimen: n/a Most Recent Eye Exam: 2 weeks ago  Current exercise: walking  Pertinent Labs:    Component Value Date/Time   CHOL 141 09/13/2016 0958   TRIG 97 09/13/2016 0958   HDL 56 09/13/2016 0958   LDLCALC 66 09/13/2016 0958   CREATININE 0.66 09/13/2016 0958    Wt Readings from Last 3 Encounters:  05/25/17 151 lb (68.5 kg)  05/04/17 146 lb 12.8 oz (66.6 kg)  02/17/17 146 lb (66.2 kg)   ------------------------------------------------------------------------ Pt reports that she has a lump on the left side of her neck. She also reports that it is sore in this area. She noticed it about 2 weeks ago. She has been having allergy type symptoms.    Pt reports that she was taking the 20 mg of the Celexa but was having a hard time getting up in the mornings so she cut it in half and has been doing well with this dose. Pt would like to eventually come of the medication.    Prior to Admission medications   Medication Sig Start Date End Date Taking? Authorizing Provider  Calcium Citrate-Vitamin D (CALCIUM + D PO) Take 1 tablet by mouth daily.    [provider]  citalopram (CELEXA) 20 MG tablet Take 1 tablet (20 mg total) by mouth daily. 05/04/17   Defrancesco, Alanda Slim, MD  empagliflozin (JARDIANCE) 25 MG TABS tablet Take 25 mg by mouth daily. 06/10/16   Jerrol Banana., MD  glucose blood (ONE TOUCH ULTRA TEST) test strip Check sugar once daily 06/15/16   Jerrol Banana., MD  hydrOXYzine (ATARAX/VISTARIL) 10 MG tablet  04/23/16    [provider]  meloxicam (MOBIC) 15 MG tablet Take 15 mg by mouth daily.    [provider]  metFORMIN (GLUCOPHAGE) 500 MG tablet Take 1 tablet (500 mg total) by mouth daily with breakfast. 09/13/16   Jerrol Banana., MD  Multiple Vitamins-Minerals (PRESERVISION/LUTEIN PO) Take by mouth.    [provider]  omeprazole (PRILOSEC) 20 MG capsule TAKE 1 CAPSULE (20 MG TOTAL) BY MOUTH DAILY. 08/27/16   Jerrol Banana., MD  [facility] LANCETS FINE MISC Check sugar once daily 05/13/15   Jerrol Banana., MD    Patient Active Problem List   Diagnosis Date Noted  . Vaginal atrophy 04/29/2016  . Fibroid, uterine 04/29/2016  . Pelvic pain in female 04/29/2016  . Hypertension 10/16/2015  . Diabetes mellitus type 2 without retinopathy ([place]) 05/27/2015  . Menopause 04/29/2015  . Fibroids 04/29/2015  . Anxiety 04/29/2015  . Cervical dysplasia 04/29/2015  . Prediabetes 04/29/2015  . History of pulmonary embolus (PE) 04/29/2015  . Family history of breast cancer in mother 04/29/2015  . Retinal detachment 04/29/2015    Past Medical History:  Diagnosis Date  . Anxiety and depression   . ASCUS with positive high risk HPV 09/2013  . Diabetes mellitus without complication ([place])   . Elevated blood sugar   . Family history of breast  cancer in mother   . Fibroid uterus   . History of pulmonary embolus (PE)   . Menopause   . Mild dysplasia of cervix 09/2013   repeat pap done -ecc neg, cerival bx cin 1  . Retinal detachment     Social History   Social History  . Marital status: Married    Spouse name: N/A  . Number of children: N/A  . Years of education: N/A   Occupational History  . Not on file.   Social History Main Topics  . Smoking status: Never Smoker  . Smokeless tobacco: Never Used  . Alcohol use No  . Drug use: No  . Sexual activity: No   Other Topics Concern  . Not on file   Social History Narrative  . No narrative on file     Allergies  Allergen Reactions  . Codeine   . Lmx 4 [Lidocaine]     Review of Systems  Constitutional: Negative.   HENT: Negative.   Eyes: Negative.   Respiratory: Negative.   Cardiovascular: Negative.   Gastrointestinal: Negative.   Genitourinary: Negative.   Musculoskeletal: Negative.   Skin: Negative.   Neurological: Negative.   Endo/Heme/Allergies: Negative.   Psychiatric/Behavioral: Negative.     Immunization History  Administered Date(s) Administered  . Influenza, High Dose Seasonal PF 09/03/2015, 06/29/2016  . Pneumococcal Conjugate-13 09/03/2015    Objective:  BP 126/74 (BP Location: Left Arm, Patient Position: Sitting, Cuff Size: Normal)   Pulse 78   Temp 97.9 F (36.6 C) (Oral)   Resp 16   Wt 151 lb (68.5 kg)   BMI 23.65 kg/m   Physical Exam  Constitutional: She is oriented to person, place, and time and well-developed, well-nourished, and in no distress.  Eyes: Pupils are equal, round, and reactive to light. Conjunctivae and EOM are normal.  Neck: Normal range of motion. Neck supple.  Cardiovascular: Normal rate, regular rhythm, normal heart sounds and intact distal pulses.   Pulmonary/Chest: Effort normal and breath sounds normal.  Musculoskeletal: Normal range of motion.  Neurological: She is alert and oriented to person, place, and time. She has normal reflexes. Gait normal. GCS score is 15.  Skin: Skin is warm and dry.  Psychiatric: Mood, memory, affect and judgment normal.    Diabetic Foot Exam - Simple   Simple Foot Form Diabetic Foot exam was performed with the following findings:  Yes 05/25/2017 12:03 PM  Visual Inspection No deformities, no ulcerations, no other skin breakdown bilaterally:  Yes Sensation Testing Intact to touch and monofilament testing bilaterally:  Yes Pulse Check Posterior Tibialis and Dorsalis pulse intact bilaterally:  Yes Comments      Lab Results  Component Value Date   WBC 6.1 09/13/2016   HGB 15.6  09/13/2016   HCT 44.8 09/13/2016   PLT 173 09/13/2016   GLUCOSE 130 (H) 09/13/2016   CHOL 141 09/13/2016   TRIG 97 09/13/2016   HDL 56 09/13/2016   LDLCALC 66 09/13/2016   TSH 3.530 09/13/2016   HGBA1C 7.0 05/25/2017   MICROALBUR 20 05/25/2017    CMP     Component Value Date/Time   NA 143 09/13/2016 0958   K 4.0 09/13/2016 0958   CL 101 09/13/2016 0958   CO2 27 09/13/2016 0958   GLUCOSE 130 (H) 09/13/2016 0958   BUN 12 09/13/2016 0958   CREATININE 0.66 09/13/2016 0958   CALCIUM 9.7 09/13/2016 0958   PROT 6.9 09/13/2016 0958   ALBUMIN 4.5 09/13/2016 0958   AST  37 09/13/2016 0958   ALT 52 (H) 09/13/2016 0958   ALKPHOS 71 09/13/2016 0958   BILITOT 0.5 09/13/2016 0958   GFRNONAA 89 09/13/2016 0958   GFRAA 103 09/13/2016 0958    Assessment and Plan :  1. Diabetes mellitus type 2 without retinopathy (HCC) 7.0 today.  - POCT HgB A1C - POCT UA - Microalbumin--20 Start losartan. 2. Essential hypertension Stable.   3. Lymphadenopathy If not better by the end of the month will send to ENT  4. Anxiety continue Celexa at 10 mg. May consider coming off next spring.    HPI, Exam, and A&P Transcribed under the direction and in the presence of Terese Heier L. Cranford Mon, MD  Electronically Signed: Katina Dung, CMA I have done the exam and reviewed the above chart and it is accurate to the best of my knowledge. Development worker, community has been used in this note in any air is in the dictation or transcription are unintentional.  [facility] 05/25/2017 12:11 PM

## 2017-06-06 ENCOUNTER — Telehealth: Payer: Self-pay | Admitting: Family Medicine

## 2017-06-06 NOTE — Telephone Encounter (Signed)
Pt advised this was from insurance call most likely, she had her UA microalbumin just recently done-aa

## 2017-06-06 NOTE — Telephone Encounter (Signed)
Pt states that she had an automated phone call stating that she was due to have her kidney function checked due to her diabetes

## 2017-06-07 MED ORDER — PIOGLITAZONE HCL 15 MG PO TABS: 15.0000 mg | ORAL_TABLET | Freq: Every day | ORAL | 12 refills | Status: DC

## 2017-06-07 NOTE — Telephone Encounter (Signed)
Pt advised and RX for Actos-generic sent in-aa

## 2017-06-07 NOTE — Telephone Encounter (Signed)
Actos 15 mg daily in place of jardiance.

## 2017-06-07 NOTE — Telephone Encounter (Signed)
Patient states that Jardiance cost went up when she tried to fill it this last time. Went up from $45 to $194. She was not sure why. I called [facility] and they said the cost did go up and it looks like insurance either changed tier on this medication or patient is in a donut hole. Can we switch to something else? She is also taking Metformin. No other medication have been tried for DM. -aa

## 2017-06-14 ENCOUNTER — Telehealth: Payer: Self-pay | Admitting: Family Medicine

## 2017-06-14 NOTE — Telephone Encounter (Signed)
Pt called saying her sugar level has been up since she changed from the jardiance.  Please advise  [phone]  Thanks Con [employer]

## 2017-06-14 NOTE — Telephone Encounter (Signed)
Increase Actos to 30mg  daily--it is not as effective as Jardiance in lowering BS.

## 2017-06-14 NOTE — Telephone Encounter (Signed)
Pt advised as below, patient will take Actos 15 mg 2 tablets daily and see how she does and then will let us know if readings are getting better and then we can send in RX for higher dose-aa

## 2017-06-14 NOTE — Telephone Encounter (Signed)
Spoke with patient. Actos was started on 06/07/17 and Jardiance was stopped due to the cost. Patient states her sugar readings have been 250, 183, 280 this morning. All fasting numbers. When she was on Jardiance sugar was 120-125. She is also taking Metformin 500 mg 1 tablet daily. Please review-  Kris Mouton, RMA

## 2017-06-16 ENCOUNTER — Telehealth: Payer: Self-pay | Admitting: Family Medicine

## 2017-06-17 ENCOUNTER — Ambulatory Visit
Admission: RE | Admit: 2017-06-17 | Discharge: 2017-06-17 | Disposition: A | Discharge status: Home / Self Care | Payer: Medicare Other | Source: Ambulatory Visit | Attending: Obstetrics and Gynecology | Admitting: Obstetrics and Gynecology

## 2017-06-17 DIAGNOSIS — Z1231 Encounter for screening mammogram for malignant neoplasm of breast: Secondary | ICD-10-CM | POA: Insufficient documentation

## 2017-06-27 ENCOUNTER — Telehealth: Payer: Self-pay

## 2017-06-27 MED ORDER — PIOGLITAZONE HCL 30 MG PO TABS: 30.0000 mg | ORAL_TABLET | Freq: Every day | ORAL | 12 refills | Status: DC

## 2017-06-27 NOTE — Telephone Encounter (Signed)
Spoke with patient. Her sugars are getting better on Actos 30 mg. RX sent in for 30 mg. Patient will let us know if she starts having sugar issues. -Kris Mouton, RMA

## 2017-06-27 NOTE — Telephone Encounter (Signed)
Pt would like for Ana to call her regarding a medication. (She would not tell me the name of it.)  I asked if she needed a refill she said "I just need to speak to [facility] about it first."  Contact number is [phone].  Thanks,   -Mickel Baas

## 2017-07-18 DIAGNOSIS — L578 Other skin changes due to chronic exposure to nonionizing radiation: Secondary | ICD-10-CM | POA: Diagnosis not present

## 2017-07-18 DIAGNOSIS — L570 Actinic keratosis: Secondary | ICD-10-CM | POA: Diagnosis not present

## 2017-07-18 DIAGNOSIS — L821 Other seborrheic keratosis: Secondary | ICD-10-CM | POA: Diagnosis not present

## 2017-08-18 ENCOUNTER — Other Ambulatory Visit: Payer: Self-pay | Admitting: Family Medicine

## 2017-08-18 DIAGNOSIS — E119 Type 2 diabetes mellitus without complications: Secondary | ICD-10-CM

## 2017-08-18 MED ORDER — METFORMIN HCL 500 MG PO TABS: 500.0000 mg | ORAL_TABLET | Freq: Every day | ORAL | 3 refills | Status: DC

## 2017-08-18 NOTE — Telephone Encounter (Signed)
[facility]ed a request for a 90-days supply for the following medication. Thanks CC  metFORMIN (GLUCOPHAGE) 500 MG tablet

## 2017-08-22 ENCOUNTER — Telehealth: Payer: Self-pay | Admitting: Family Medicine

## 2017-08-25 ENCOUNTER — Ambulatory Visit: Payer: Self-pay | Admitting: Family Medicine

## 2017-08-29 ENCOUNTER — Encounter (INDEPENDENT_AMBULATORY_CARE_PROVIDER_SITE_OTHER): Payer: Medicare Other | Admitting: Ophthalmology

## 2017-08-29 DIAGNOSIS — H43813 Vitreous degeneration, bilateral: Secondary | ICD-10-CM | POA: Diagnosis not present

## 2017-08-29 DIAGNOSIS — H59031 Cystoid macular edema following cataract surgery, right eye: Secondary | ICD-10-CM

## 2017-08-29 DIAGNOSIS — H2513 Age-related nuclear cataract, bilateral: Secondary | ICD-10-CM

## 2017-08-29 DIAGNOSIS — H35371 Puckering of macula, right eye: Secondary | ICD-10-CM

## 2017-08-29 DIAGNOSIS — H338 Other retinal detachments: Secondary | ICD-10-CM

## 2017-09-13 ENCOUNTER — Encounter: Payer: Self-pay | Admitting: Family Medicine

## 2017-09-13 ENCOUNTER — Ambulatory Visit (INDEPENDENT_AMBULATORY_CARE_PROVIDER_SITE_OTHER): Payer: Medicare Other | Admitting: Family Medicine

## 2017-09-13 VITALS — BP 128/70 | HR 78 | Temp 97.6°F | Resp 16 | Wt 160.0 lb

## 2017-09-13 DIAGNOSIS — E119 Type 2 diabetes mellitus without complications: Secondary | ICD-10-CM

## 2017-09-13 DIAGNOSIS — Z23 Encounter for immunization: Secondary | ICD-10-CM

## 2017-09-13 LAB — POCT GLYCOSYLATED HEMOGLOBIN (HGB A1C): Hemoglobin A1C: 6.8

## 2017-09-13 MED ORDER — EMPAGLIFLOZIN 25 MG PO TABS: 25.0000 mg | ORAL_TABLET | Freq: Every day | ORAL | 11 refills | Status: DC

## 2017-09-13 NOTE — Progress Notes (Signed)
Patient: Anna Williams Female    DOB: [birth year]   71 y.o.   MRN: [id number] Visit Date: 09/13/2017  Today's Provider: Wilhemena Durie, MD   Chief Complaint  Patient presents with  . Diabetes   Subjective:    HPI  Diabetes Mellitus Type II, Follow-up:   Lab Results  Component Value Date   HGBA1C 7.0 05/25/2017   HGBA1C 6.7 01/04/2017   HGBA1C 6.2 (H) 09/13/2016    Last seen for diabetes 3 months ago.  Management since then includes none.  She reports good compliance with treatment. She is having side effects. She has been having a lot of headaches with the Actos. She would like to go back on her Jardiance in the new year since her insurance will cover it again. She would like to get samples if she can to carry her over to the new year.   Home blood sugar records: 130-140  Episodes of hypoglycemia? no   Current Insulin Regimen: n/a Most Recent Eye Exam: 1 year. Current exercise: walking  Pertinent Labs:    Component Value Date/Time   CHOL 141 09/13/2016 0958   TRIG 97 09/13/2016 0958   HDL 56 09/13/2016 0958   LDLCALC 66 09/13/2016 0958   CREATININE 0.66 09/13/2016 0958    Wt Readings from Last 3 Encounters:  09/13/17 160 lb (72.6 kg)  05/25/17 151 lb (68.5 kg)  05/04/17 146 lb 12.8 oz (66.6 kg)    ------------------------------------------------------------------------      Allergies  Allergen Reactions  . Codeine   . Lmx 4 [Lidocaine]      Current Outpatient Medications:  .  Calcium Citrate-Vitamin D (CALCIUM + D PO), Take 1 tablet by mouth daily., Disp: , Rfl:  .  citalopram (CELEXA) 20 MG tablet, Take 1 tablet (20 mg total) by mouth daily., Disp: 90 tablet, Rfl: 3 .  glucose blood (ONE TOUCH ULTRA TEST) test strip, Check sugar once daily, Disp: 100 each, Rfl: 12 .  hydrOXYzine (ATARAX/VISTARIL) 10 MG tablet, , Disp: , Rfl:  .  meloxicam (MOBIC) 15 MG tablet, Take 15 mg by mouth daily., Disp: , Rfl:  .  metFORMIN (GLUCOPHAGE) 500 MG  tablet, Take 1 tablet (500 mg total) daily with breakfast by mouth., Disp: 90 tablet, Rfl: 3 .  Multiple Vitamins-Minerals (PRESERVISION/LUTEIN PO), Take by mouth., Disp: , Rfl:  .  omeprazole (PRILOSEC) 20 MG capsule, TAKE 1 CAPSULE (20 MG TOTAL) BY MOUTH DAILY., Disp: 30 capsule, Rfl: 12 .  ONETOUCH DELICA LANCETS FINE MISC, Check sugar once daily, Disp: 50 each, Rfl: 12 .  pioglitazone (ACTOS) 30 MG tablet, Take 1 tablet (30 mg total) by mouth daily., Disp: 30 tablet, Rfl: 12  Review of Systems  Constitutional: Negative.   HENT: Negative.   Eyes: Negative.   Respiratory: Negative.   Cardiovascular: Negative.   Gastrointestinal: Negative.   Endocrine: Negative.   Genitourinary: Negative.   Musculoskeletal: Negative.   Skin: Negative.   Allergic/Immunologic: Negative.   Neurological: Negative.   Hematological: Negative.   Psychiatric/Behavioral: Negative.     Social History   Tobacco Use  . Smoking status: Never Smoker  . Smokeless tobacco: Never Used  Substance Use Topics  . Alcohol use: No   Objective:   BP 128/70 (BP Location: Left Arm, Patient Position: Sitting, Cuff Size: Normal)   Pulse 78   Temp 97.6 F (36.4 C) (Oral)   Resp 16   Wt 160 lb (72.6 kg)   BMI 25.06  kg/m  Vitals:   09/13/17 1549  BP: 128/70  Pulse: 78  Resp: 16  Temp: 97.6 F (36.4 C)  TempSrc: Oral  Weight: 160 lb (72.6 kg)     Physical Exam  Constitutional: She is oriented to person, place, and time. She appears well-developed and well-nourished.  Eyes: Conjunctivae and EOM are normal. Pupils are equal, round, and reactive to light.  Neck: Normal range of motion. Neck supple.  Cardiovascular: Normal rate, regular rhythm, normal heart sounds and intact distal pulses.  Pulmonary/Chest: Effort normal and breath sounds normal.  Musculoskeletal: Normal range of motion.  Neurological: She is alert and oriented to person, place, and time. She has normal reflexes.  Skin: Skin is warm and dry.   Psychiatric: She has a normal mood and affect. Her behavior is normal. Judgment and thought content normal.        Assessment & Plan:     1. Diabetes mellitus type 2 without retinopathy ([place]) Stop Actos. Restart Jardiance follow up in 3 months - POCT HgB A1C 6.8   2. Need for influenza vaccination  - Flu vaccine HIGH DOSE PF  3. Need for pneumococcal vaccination  - Pneumococcal polysaccharide vaccine 23-valent greater than or equal to 2yo subcutaneous/IM     HPI, Exam, and A&P Transcribed under the direction and in the presence of Richard L. Cranford Mon, MD  Electronically Signed: Katina Dung, CMA  I have done the exam and reviewed the above chart and it is accurate to the best of my knowledge. Development worker, community has been used in this note in any air is in the dictation or transcription are unintentional.  Wilhemena Durie, MD  [place]

## 2017-09-13 NOTE — Patient Instructions (Signed)
Stop Actos for about a week. Then start the samples of Jardiance 10 mg. Then in January we will call in the Jardiance 25 mg.

## 2017-09-17 ENCOUNTER — Other Ambulatory Visit: Payer: Self-pay | Admitting: Family Medicine

## 2017-09-17 DIAGNOSIS — K219 Gastro-esophageal reflux disease without esophagitis: Secondary | ICD-10-CM

## 2017-09-28 NOTE — Telephone Encounter (Signed)
Duplicate

## 2017-11-15 DIAGNOSIS — J019 Acute sinusitis, unspecified: Secondary | ICD-10-CM | POA: Diagnosis not present

## 2017-11-23 ENCOUNTER — Telehealth: Payer: Self-pay | Admitting: Family Medicine

## 2017-11-30 ENCOUNTER — Other Ambulatory Visit: Payer: Self-pay | Admitting: Family Medicine

## 2017-11-30 DIAGNOSIS — E119 Type 2 diabetes mellitus without complications: Secondary | ICD-10-CM

## 2017-12-12 ENCOUNTER — Ambulatory Visit: Payer: Medicare Other

## 2017-12-13 ENCOUNTER — Ambulatory Visit: Payer: Self-pay | Admitting: Family Medicine

## 2017-12-15 ENCOUNTER — Telehealth: Payer: Self-pay

## 2017-12-15 ENCOUNTER — Ambulatory Visit: Payer: Medicare Other

## 2017-12-15 ENCOUNTER — Ambulatory Visit (INDEPENDENT_AMBULATORY_CARE_PROVIDER_SITE_OTHER): Payer: Medicare Other

## 2017-12-15 VITALS — BP 124/62 | HR 80 | Temp 98.1°F | Ht 67.0 in | Wt 159.8 lb

## 2017-12-15 DIAGNOSIS — E2839 Other primary ovarian failure: Secondary | ICD-10-CM

## 2017-12-15 DIAGNOSIS — Z0000 Encounter for general adult medical examination without abnormal findings: Secondary | ICD-10-CM

## 2017-12-15 NOTE — Progress Notes (Signed)
Subjective:   Anna Williams is a 72 y.o. female who presents for Medicare Annual (Subsequent) preventive examination.  Review of Systems:  N/A  Cardiac Risk Factors include: advanced age (>48men, >105 women);diabetes mellitus;hypertension     Objective:     Vitals: BP 124/62 (BP Location: Left Arm)   Pulse 80   Temp 98.1 F (36.7 C) (Oral)   Ht 5\' 7"  (1.702 m)   Wt 159 lb 12.8 oz (72.5 kg)   BMI 25.03 kg/m   Body mass index is 25.03 kg/m.  Advanced Directives 12/15/2017 09/13/2016 05/19/2015  Does Patient Have a Medical Advance Directive? Yes No No  Type of Advance Directive Living will - -  Would patient like information on creating a medical advance directive? - - No - patient declined information    Tobacco Social History   Tobacco Use  Smoking Status Never Smoker  Smokeless Tobacco Never Used     Counseling given: Not Answered   Clinical Intake:  Pre-visit preparation completed: Yes  Pain : No/denies pain Pain Score: 0-No pain     Nutritional Status: BMI 25 -29 Overweight Nutritional Risks: None Diabetes: Yes(type 2) CBG done?: No Did pt. bring in CBG monitor from home?: No  How often do you need to have someone help you when you read instructions, pamphlets, or other written materials from your doctor or pharmacy?: 1 - Never  Interpreter Needed?: No  Information entered by :: [facility], LPN  Past Medical History:  Diagnosis Date  . Anxiety and depression   . ASCUS with positive high risk HPV 09/2013  . Diabetes mellitus without complication ([place])    type 2  . Elevated blood sugar   . Family history of breast cancer in mother   . Fibroid uterus   . History of pulmonary embolus (PE)   . Hypertension   . Menopause   . Mild dysplasia of cervix 09/2013   repeat pap done -ecc neg, cerival bx cin 1  . Retinal detachment    Past Surgical History:  Procedure Laterality Date  . anterior neck fusion    . BREAST CYST ASPIRATION Left yrs ago  .  CHOLECYSTECTOMY  2010  . DILATION AND CURETTAGE OF UTERUS    . HYSTEROSCOPY    . mole removed  2014   Family History  Problem Relation Age of Onset  . Breast cancer Mother 62  . Heart disease Father   . Throat cancer Brother   . Heart disease Brother   . Lung cancer Brother   . Breast cancer Maternal Aunt   . Colon cancer Neg Hx   . Ovarian cancer Neg Hx    Social History   Socioeconomic History  . Marital status: Married    Spouse name: None  . Number of children: 2  . Years of education: None  . Highest education level: Associate degree: occupational, Hotel manager, or vocational program  Social Needs  . Financial resource strain: Not hard at all  . Food insecurity - worry: Never true  . Food insecurity - inability: Never true  . Transportation needs - medical: No  . Transportation needs - non-medical: No  Occupational History  . Occupation: retired  Tobacco Use  . Smoking status: Never Smoker  . Smokeless tobacco: Never Used  Substance and Sexual Activity  . Alcohol use: No  . Drug use: No  . Sexual activity: No    Birth control/protection: Post-menopausal  Other Topics Concern  . None  Social History Narrative  .  None    Outpatient Encounter Medications as of 12/15/2017  Medication Sig  . Calcium Citrate-Vitamin D (CALCIUM + D PO) Take 1 tablet by mouth daily.  . citalopram (CELEXA) 20 MG tablet Take 1 tablet (20 mg total) by mouth daily. (Patient taking differently: Take 10 mg by mouth daily. )  . empagliflozin (JARDIANCE) 25 MG TABS tablet Take 25 mg by mouth daily.  Marland Kitchen glucose blood (ONE TOUCH ULTRA TEST) test strip CHECK SUGAR ONCE DAILY  . hydrOXYzine (ATARAX/VISTARIL) 10 MG tablet Take 10 mg by mouth every 8 (eight) hours as needed.   . meloxicam (MOBIC) 15 MG tablet Take 15 mg by mouth daily.   . metFORMIN (GLUCOPHAGE) 500 MG tablet Take 1 tablet (500 mg total) daily with breakfast by mouth.  . Multiple Vitamins-Minerals (PRESERVISION/LUTEIN PO) Take by  mouth daily.   Marland Kitchen omeprazole (PRILOSEC) 20 MG capsule TAKE 1 CAPSULE (20 MG TOTAL) BY MOUTH DAILY.  Marland Kitchen ONETOUCH DELICA LANCETS FINE MISC Check sugar once daily  . [DISCONTINUED] omeprazole (PRILOSEC) 20 MG capsule TAKE 1 CAPSULE (20 MG TOTAL) BY MOUTH DAILY.   No facility-administered encounter medications on file as of 12/15/2017.     Activities of Daily Living In your present state of health, do you have any difficulty performing the following activities: 12/15/2017 05/25/2017  Hearing? N N  Vision? N N  Difficulty concentrating or making decisions? N N  Walking or climbing stairs? N N  Dressing or bathing? N N  Doing errands, shopping? N N  Preparing Food and eating ? N -  Using the Toilet? N -  In the past six months, have you accidently leaked urine? Y -  Comment Occasionally with pressure.  -  Do you have problems with loss of bowel control? N -  Managing your Medications? N -  Managing your Finances? N -  Housekeeping or managing your Housekeeping? N -  Some recent data might be hidden    Patient Care Team: Jerrol Banana., MD as PCP - General (Family Medicine) Lorelee Cover., MD as Consulting Physician (Ophthalmology) Hayden Pedro, MD as Consulting Physician (Ophthalmology) Defrancesco, Alanda Slim, MD as Consulting Physician (Obstetrics and Gynecology)    Assessment:   This is a routine wellness examination for Anna Williams.  Exercise Activities and Dietary recommendations Current Exercise Habits: The patient does not participate in regular exercise at present, Exercise limited by: None identified  Goals    . DIET - INCREASE WATER INTAKE     Recommend to continue drinking 6-8 glasses of water a day.        Fall Risk Fall Risk  12/15/2017 09/13/2017 09/13/2016 05/19/2015 05/13/2015  Falls in the past year? No No No No No   Is the patient's home free of loose throw rugs in walkways, pet beds, electrical cords, etc?   yes      Grab bars in the bathroom? yes       Handrails on the stairs?   yes      Adequate lighting?   yes  Timed Get Up and Go performed: N/A  Depression Screen PHQ 2/9 Scores 12/15/2017 09/13/2017 01/04/2017 09/13/2016  PHQ - 2 Score 2 0 2 2  PHQ- 9 Score 12 - 7 -     Cognitive Function: Pt declined screening today.      6CIT Screen 09/13/2016  What Year? 0 points  What month? 0 points  What time? 0 points  Count back from 20 0 points  Months in  reverse 0 points  Repeat phrase 0 points  Total Score 0    Immunization History  Administered Date(s) Administered  . Influenza, High Dose Seasonal PF 09/03/2015, 06/29/2016, 09/13/2017  . Pneumococcal Conjugate-13 09/03/2015  . Pneumococcal Polysaccharide-23 09/13/2017    Qualifies for Shingles Vaccine? Due for Shingles vaccine. Declined my offer to administer today. Education has been provided regarding the importance of this vaccine. Pt has been advised to call her insurance company to determine her out of pocket expense. Advised she may also receive this vaccine at her local pharmacy or Health Dept. Verbalized acceptance and understanding.  Screening Tests Health Maintenance  Topic Date Due  . Hepatitis C Screening  [birth year]  . TETANUS/TDAP  06/03/1964  . DEXA SCAN  06/03/2010  . OPHTHALMOLOGY EXAM  11/01/2017  . HEMOGLOBIN A1C  03/14/2018  . FOOT EXAM  05/25/2018  . URINE MICROALBUMIN  05/25/2018  . MAMMOGRAM  06/18/2019  . COLONOSCOPY  06/25/2024  . INFLUENZA VACCINE  Completed  . PNA vac Low Risk Adult  Completed    Cancer Screenings: Lung: Low Dose CT Chest recommended if Age 34-80 years, 30 pack-year currently smoking OR have quit w/in 15years. Patient does not qualify. Breast:  Up to date on Mammogram? Yes   Up to date of Bone Density/Dexa? No, referral sent today. Colorectal: Up to date  Additional Screenings:  Hepatitis C Screening: Declined today, note placed to add onto blood work orders at next [place].     Plan:  I have personally reviewed and  addressed the Medicare Annual Wellness questionnaire and have noted the following in the patient's chart:  A. Medical and social history B. Use of alcohol, tobacco or illicit drugs  C. Current medications and supplements D. Functional ability and status E.  Nutritional status F.  Physical activity G. Advance directives H. List of other physicians I.  Hospitalizations, surgeries, and ER visits in previous 12 months J.  [place] such as hearing and vision if needed, cognitive and depression L. Referrals and appointments - none  In addition, I have reviewed and discussed with patient certain preventive protocols, quality metrics, and best practice recommendations. A written personalized care plan for preventive services as well as general preventive health recommendations were provided to patient.  See attached scanned questionnaire for additional information.   Signed,  Fabio Neighbors, LPN Nurse Health Advisor   Nurse Recommendations: Pt declined the tetanus vaccine today. Order placed for a DEXA scan. Please add the Hepatitis C lab order onto additional blood work orders at next [place] on 12/20/17.

## 2017-12-15 NOTE — Telephone Encounter (Signed)
Pt stated in the AWV today that she has had an eye exam within the last year at [facility]. Called clinic to verify date and the receptionist said pt has not had an eye exam since 09/2016. She stated pt did get a new pair of glasses in 09/2017 but the prescription was the same. HM not UTD. FYI to PCP.  -MM

## 2017-12-15 NOTE — Patient Instructions (Signed)
Anna Williams , Thank you for taking time to come for your Medicare Wellness Visit. I appreciate your ongoing commitment to your health goals. Please review the following plan we discussed and let me know if I can assist you in the future.   Screening recommendations/referrals: Colonoscopy: Up to date Mammogram: Up to date Bone Density: Referral sent today Recommended yearly ophthalmology/optometry visit for glaucoma screening and checkup Recommended yearly dental visit for hygiene and checkup  Vaccinations: Influenza vaccine: Up to date Pneumococcal vaccine: Up to date Tdap vaccine: Pt declines today.  Shingles vaccine: Pt declines today.     Advanced directives: Advance directive discussed with you today. I have provided a copy for you to complete at home and have notarized. Once this is complete please bring a copy in to our office so we can scan it into your chart.  Conditions/risks identified: Recommend to continue drinking 6-8 glasses of water a day.   Next appointment: 12/20/17 @ 10:00 AM.   Preventive Care 73 Years and Older, Female Preventive care refers to lifestyle choices and visits with your health care provider that can promote health and wellness. What does preventive care include?  A yearly physical exam. This is also called an annual well check.  Dental exams once or twice a year.  Routine eye exams. Ask your health care provider how often you should have your eyes checked.  Personal lifestyle choices, including:  Daily care of your teeth and gums.  Regular physical activity.  Eating a healthy diet.  Avoiding tobacco and drug use.  Limiting alcohol use.  Practicing safe sex.  Taking low-dose aspirin every day.  Taking vitamin and mineral supplements as recommended by your health care provider. What happens during an annual well check? The services and screenings done by your health care provider during your annual well check will depend on your age,  overall health, lifestyle risk factors, and family history of disease. Counseling  Your health care provider may ask you questions about your:  Alcohol use.  Tobacco use.  Drug use.  Emotional well-being.  Home and relationship well-being.  Sexual activity.  Eating habits.  History of falls.  Memory and ability to understand (cognition).  Work and work Statistician.  Reproductive health. Screening  You may have the following tests or measurements:  Height, weight, and BMI.  Blood pressure.  Lipid and cholesterol levels. These may be checked every 5 years, or more frequently if you are over 66 years old.  Skin check.  Lung cancer screening. You may have this screening every year starting at age 33 if you have a 30-pack-year history of smoking and currently smoke or have quit within the past 15 years.  Fecal occult blood test (FOBT) of the stool. You may have this test every year starting at age 52.  Flexible sigmoidoscopy or colonoscopy. You may have a sigmoidoscopy every 5 years or a colonoscopy every 10 years starting at age 73.  Hepatitis C blood test.  Hepatitis B blood test.  Sexually transmitted disease (STD) testing.  Diabetes screening. This is done by checking your blood sugar (glucose) after you have not eaten for a while (fasting). You may have this done every 1-3 years.  Bone density scan. This is done to screen for osteoporosis. You may have this done starting at age 49.  Mammogram. This may be done every 1-2 years. Talk to your health care provider about how often you should have regular mammograms. Talk with your health care provider about  your test results, treatment options, and if necessary, the need for more tests. Vaccines  Your health care provider may recommend certain vaccines, such as:  Influenza vaccine. This is recommended every year.  Tetanus, diphtheria, and acellular pertussis (Tdap, Td) vaccine. You may need a Td booster every 10  years.  Zoster vaccine. You may need this after age 46.  Pneumococcal 13-valent conjugate (PCV13) vaccine. One dose is recommended after age 79.  Pneumococcal polysaccharide (PPSV23) vaccine. One dose is recommended after age 52. Talk to your health care provider about which screenings and vaccines you need and how often you need them. This information is not intended to replace advice given to you by your health care provider. Make sure you discuss any questions you have with your health care provider. Document Released: 10/24/2015 Document Revised: 06/16/2016 Document Reviewed: 07/29/2015 Elsevier Interactive Patient Education  2017 [place] Prevention in the Home Falls can cause injuries. They can happen to people of all ages. There are many things you can do to make your home safe and to help prevent falls. What can I do on the outside of my home?  Regularly fix the edges of walkways and driveways and fix any cracks.  Remove anything that might make you trip as you walk through a door, such as a raised step or threshold.  Trim any bushes or trees on the path to your home.  Use bright outdoor lighting.  Clear any walking paths of anything that might make someone trip, such as rocks or tools.  Regularly check to see if handrails are loose or broken. Make sure that both sides of any steps have handrails.  Any raised decks and porches should have guardrails on the edges.  Have any leaves, snow, or ice cleared regularly.  Use sand or salt on walking paths during winter.  Clean up any spills in your garage right away. This includes oil or grease spills. What can I do in the bathroom?  Use night lights.  Install grab bars by the toilet and in the tub and shower. Do not use towel bars as grab bars.  Use non-skid mats or decals in the tub or shower.  If you need to sit down in the shower, use a plastic, non-slip stool.  Keep the floor dry. Clean up any water that  spills on the floor as soon as it happens.  Remove soap buildup in the tub or shower regularly.  Attach bath mats securely with double-sided non-slip rug tape.  Do not have throw rugs and other things on the floor that can make you trip. What can I do in the bedroom?  Use night lights.  Make sure that you have a light by your bed that is easy to reach.  Do not use any sheets or blankets that are too big for your bed. They should not hang down onto the floor.  Have a firm chair that has side arms. You can use this for support while you get dressed.  Do not have throw rugs and other things on the floor that can make you trip. What can I do in the kitchen?  Clean up any spills right away.  Avoid walking on wet floors.  Keep items that you use a lot in easy-to-reach places.  If you need to reach something above you, use a strong step stool that has a grab bar.  Keep electrical cords out of the way.  Do not use floor polish or wax  that makes floors slippery. If you must use wax, use non-skid floor wax.  Do not have throw rugs and other things on the floor that can make you trip. What can I do with my stairs?  Do not leave any items on the stairs.  Make sure that there are handrails on both sides of the stairs and use them. Fix handrails that are broken or loose. Make sure that handrails are as long as the stairways.  Check any carpeting to make sure that it is firmly attached to the stairs. Fix any carpet that is loose or worn.  Avoid having throw rugs at the top or bottom of the stairs. If you do have throw rugs, attach them to the floor with carpet tape.  Make sure that you have a light switch at the top of the stairs and the bottom of the stairs. If you do not have them, ask someone to add them for you. What else can I do to help prevent falls?  Wear shoes that:  Do not have high heels.  Have rubber bottoms.  Are comfortable and fit you well.  Are closed at the  toe. Do not wear sandals.  If you use a stepladder:  Make sure that it is fully opened. Do not climb a closed stepladder.  Make sure that both sides of the stepladder are locked into place.  Ask someone to hold it for you, if possible.  Clearly mark and make sure that you can see:  Any grab bars or handrails.  First and last steps.  Where the edge of each step is.  Use tools that help you move around (mobility aids) if they are needed. These include:  Canes.  Walkers.  Scooters.  Crutches.  Turn on the lights when you go into a dark area. Replace any light bulbs as soon as they burn out.  Set up your furniture so you have a clear path. Avoid moving your furniture around.  If any of your floors are uneven, fix them.  If there are any pets around you, be aware of where they are.  Review your medicines with your doctor. Some medicines can make you feel dizzy. This can increase your chance of falling. Ask your doctor what other things that you can do to help prevent falls. This information is not intended to replace advice given to you by your health care provider. Make sure you discuss any questions you have with your health care provider. Document Released: 07/24/2009 Document Revised: 03/04/2016 Document Reviewed: 11/01/2014 Elsevier Interactive Patient Education  2017 [employer].

## 2017-12-15 NOTE — Telephone Encounter (Signed)
Scheduled and completed 12/15/17. -MM

## 2017-12-16 NOTE — Telephone Encounter (Signed)
Please record--thanks.

## 2017-12-20 ENCOUNTER — Encounter: Payer: Self-pay | Admitting: Family Medicine

## 2017-12-20 ENCOUNTER — Ambulatory Visit (INDEPENDENT_AMBULATORY_CARE_PROVIDER_SITE_OTHER): Payer: Medicare Other | Admitting: Family Medicine

## 2017-12-20 VITALS — BP 122/60 | HR 72 | Temp 98.0°F | Resp 16

## 2017-12-20 DIAGNOSIS — Z780 Asymptomatic menopausal state: Secondary | ICD-10-CM

## 2017-12-20 DIAGNOSIS — N952 Postmenopausal atrophic vaginitis: Secondary | ICD-10-CM | POA: Diagnosis not present

## 2017-12-20 DIAGNOSIS — F419 Anxiety disorder, unspecified: Secondary | ICD-10-CM

## 2017-12-20 DIAGNOSIS — E119 Type 2 diabetes mellitus without complications: Secondary | ICD-10-CM

## 2017-12-20 DIAGNOSIS — I10 Essential (primary) hypertension: Secondary | ICD-10-CM

## 2017-12-20 NOTE — Progress Notes (Signed)
Patient: Anna Williams Female    DOB: 22-Oct-1945   72 y.o.   MRN: [id number] Visit Date: 12/20/2017  Today's Provider: Wilhemena Durie, MD   Chief Complaint  Patient presents with  . Diabetes  . Hypertension   Subjective:    HPI  Diabetes Mellitus Type II, Follow-up:   Lab Results  Component Value Date   HGBA1C 6.8 09/13/2017   HGBA1C 7.0 05/25/2017   HGBA1C 6.7 01/04/2017    Last seen for diabetes 4 months ago.  Management since then includes none. She reports good compliance with treatment. She is not having side effects.  Home blood sugar records: 120-150's  Episodes of hypoglycemia?none  Current exercise: walking  Pt has her eye exam next week.   Pertinent Labs:    Component Value Date/Time   CHOL 141 09/13/2016 0958   TRIG 97 09/13/2016 0958   HDL 56 09/13/2016 0958   LDLCALC 66 09/13/2016 0958   CREATININE 0.66 09/13/2016 0958    Wt Readings from Last 3 Encounters:  12/15/17 159 lb 12.8 oz (72.5 kg)  09/13/17 160 lb (72.6 kg)  05/25/17 151 lb (68.5 kg)   ------------------------------------------------------------------------   Hypertension, follow-up:  BP Readings from Last 3 Encounters:  12/20/17 122/60  12/15/17 124/62  09/13/17 128/70    She was last seen for hypertension 4 months ago.  BP at that visit was 128/70. Management since that visit includes none. She reports good compliance with treatment. She is not having side effects.  She is exercising. Patient denies chest pain, chest pressure/discomfort, claudication, dyspnea, exertional chest pressure/discomfort, fatigue, irregular heart beat, lower extremity edema, near-syncope, orthopnea, palpitations, paroxysmal nocturnal dyspnea, syncope and tachypnea.   Wt Readings from Last 3 Encounters:  12/15/17 159 lb 12.8 oz (72.5 kg)  09/13/17 160 lb (72.6 kg)  05/25/17 151 lb (68.5 kg)   ------------------------------------------------------------------------  Pt reports  that she has had vaginal irritation that she thinks may be caused by Jardiance or that she took antibiotics in February. The symptoms have been going on for a couple weeks. She has tried Monistat OTC and helped some, but did not completely take care of it. "calmed it down a little bit".         Allergies  Allergen Reactions  . Codeine   . Lmx 4 [Lidocaine]      Current Outpatient Medications:  .  Calcium Citrate-Vitamin D (CALCIUM + D PO), Take 1 tablet by mouth daily., Disp: , Rfl:  .  citalopram (CELEXA) 20 MG tablet, Take 1 tablet (20 mg total) by mouth daily. (Patient taking differently: Take 10 mg by mouth daily. ), Disp: 90 tablet, Rfl: 3 .  empagliflozin (JARDIANCE) 25 MG TABS tablet, Take 25 mg by mouth daily., Disp: 30 tablet, Rfl: 11 .  glucose blood (ONE TOUCH ULTRA TEST) test strip, CHECK SUGAR ONCE DAILY, Disp: 100 each, Rfl: 11 .  hydrOXYzine (ATARAX/VISTARIL) 10 MG tablet, Take 10 mg by mouth every 8 (eight) hours as needed. , Disp: , Rfl:  .  meloxicam (MOBIC) 15 MG tablet, Take 15 mg by mouth daily. , Disp: , Rfl:  .  metFORMIN (GLUCOPHAGE) 500 MG tablet, Take 1 tablet (500 mg total) daily with breakfast by mouth., Disp: 90 tablet, Rfl: 3 .  Multiple Vitamins-Minerals (PRESERVISION/LUTEIN PO), Take by mouth daily. , Disp: , Rfl:  .  omeprazole (PRILOSEC) 20 MG capsule, TAKE 1 CAPSULE (20 MG TOTAL) BY MOUTH DAILY., Disp: 30 capsule, Rfl: 12 .  ONETOUCH DELICA LANCETS FINE MISC, Check sugar once daily, Disp: 50 each, Rfl: 12  Review of Systems  Constitutional: Negative.   HENT: Negative.   Eyes: Negative.   Respiratory: Negative.   Cardiovascular: Negative.   Gastrointestinal: Negative.   Endocrine: Negative.   Genitourinary: Negative.        Itching and burning. ,Mild,external.  Musculoskeletal: Negative.   Skin: Negative.   Allergic/Immunologic: Negative.   Neurological: Negative.   Hematological: Negative.   Psychiatric/Behavioral: Negative.     Social  History   Tobacco Use  . Smoking status: Never Smoker  . Smokeless tobacco: Never Used  Substance Use Topics  . Alcohol use: No   Objective:   BP 122/60 (BP Location: Left Arm, Patient Position: Sitting, Cuff Size: Normal)   Pulse 72   Temp 98 F (36.7 C) (Oral)   Resp 16  Vitals:   12/20/17 1019  BP: 122/60  Pulse: 72  Resp: 16  Temp: 98 F (36.7 C)  TempSrc: Oral     Physical Exam  Constitutional: She is oriented to person, place, and time. She appears well-developed and well-nourished.  HENT:  Head: Normocephalic and atraumatic.  Right Ear: External ear normal.  Left Ear: External ear normal.  Nose: Nose normal.  Mouth/Throat: Oropharynx is clear and moist.  Eyes: Conjunctivae and EOM are normal. Pupils are equal, round, and reactive to light. No scleral icterus.  Neck: No thyromegaly present.  Cardiovascular: Normal rate, regular rhythm and normal heart sounds.  Pulmonary/Chest: Effort normal and breath sounds normal.  Abdominal: Soft. Bowel sounds are normal.  Musculoskeletal: She exhibits no edema.  Lymphadenopathy:    She has no cervical adenopathy.  Neurological: She is alert and oriented to person, place, and time.  Skin: Skin is warm and dry.  Psychiatric: She has a normal mood and affect. Her behavior is normal. Judgment and thought content normal.        Assessment & Plan:     1. Essential hypertension  - CBC with Differential/Platelet - TSH  2. Diabetes mellitus type 2 without retinopathy ([place])  - Hemoglobin A1c - Lipid panel - Comprehensive metabolic panel 3.Mild Vaginitis Pt declines wet prep. Most likely due to [place] which has helped DM a lot. She has exam with Gyn/Dr D later this summer. 4.Adjustment Reaction with family stressors Pt needs Tdap update.      Jumaane Weatherford Cranford Mon, MD  [facility]

## 2017-12-21 LAB — COMPREHENSIVE METABOLIC PANEL
ALT: 47 IU/L — ABNORMAL HIGH (ref 0–32)
AST: 38 IU/L (ref 0–40)
Albumin/Globulin Ratio: 1.6 (ref 1.2–2.2)
Albumin: 4.5 g/dL (ref 3.5–4.8)
Alkaline Phosphatase: 87 IU/L (ref 39–117)
BUN/Creatinine Ratio: 13 (ref 12–28)
BUN: 11 mg/dL (ref 8–27)
Bilirubin Total: 0.8 mg/dL (ref 0.0–1.2)
CO2: 24 mmol/L (ref 20–29)
Calcium: 9.9 mg/dL (ref 8.7–10.3)
Chloride: 102 mmol/L (ref 96–106)
Creatinine, Ser: 0.86 mg/dL (ref 0.57–1.00)
GFR calc Af Amer: 78 mL/min/{1.73_m2} (ref >59)
GFR calc non Af Amer: 68 mL/min/{1.73_m2} (ref >59)
Globulin, Total: 2.8 g/dL (ref 1.5–4.5)
Glucose: 170 mg/dL — ABNORMAL HIGH (ref 65–99)
Potassium: 4.1 mmol/L (ref 3.5–5.2)
Sodium: 143 mmol/L (ref 134–144)
Total Protein: 7.3 g/dL (ref 6.0–8.5)

## 2017-12-21 LAB — CBC WITH DIFFERENTIAL/PLATELET
Basophils Absolute: 0 10*3/uL (ref 0.0–0.2)
Basos: 1 %
EOS (ABSOLUTE): 0.2 10*3/uL (ref 0.0–0.4)
Eos: 3 %
Hematocrit: 46.8 % — ABNORMAL HIGH (ref 34.0–46.6)
Hemoglobin: 15.9 g/dL (ref 11.1–15.9)
Immature Grans (Abs): 0 10*3/uL (ref 0.0–0.1)
Immature Granulocytes: 0 %
Lymphocytes Absolute: 1.9 10*3/uL (ref 0.7–3.1)
Lymphs: 35 %
MCH: 32.1 pg (ref 26.6–33.0)
MCHC: 34 g/dL (ref 31.5–35.7)
MCV: 95 fL (ref 79–97)
Monocytes Absolute: 0.4 10*3/uL (ref 0.1–0.9)
Monocytes: 8 %
Neutrophils Absolute: 2.9 10*3/uL (ref 1.4–7.0)
Neutrophils: 53 %
Platelets: 183 10*3/uL (ref 150–379)
RBC: 4.95 x10E6/uL (ref 3.77–5.28)
RDW: 13.4 % (ref 12.3–15.4)
WBC: 5.3 10*3/uL (ref 3.4–10.8)

## 2017-12-21 LAB — LIPID PANEL
Chol/HDL Ratio: 3.3 ratio (ref 0.0–4.4)
Cholesterol, Total: 170 mg/dL (ref 100–199)
HDL: 51 mg/dL (ref >39)
LDL Calculated: 91 mg/dL (ref 0–99)
Triglycerides: 141 mg/dL (ref 0–149)
VLDL Cholesterol Cal: 28 mg/dL (ref 5–40)

## 2017-12-21 LAB — HEMOGLOBIN A1C
Est. average glucose Bld gHb Est-mCnc: 169 mg/dL
Hgb A1c MFr Bld: 7.5 % — ABNORMAL HIGH (ref 4.8–5.6)

## 2017-12-21 LAB — TSH: TSH: 1.97 u[IU]/mL (ref 0.450–4.500)

## 2017-12-23 ENCOUNTER — Telehealth: Payer: Self-pay

## 2017-12-23 NOTE — Telephone Encounter (Signed)
-----   Message from Jerrol Banana., MD sent at 12/22/2017  6:38 PM EDT ----- Labs stable.

## 2017-12-23 NOTE — Telephone Encounter (Signed)
Patient advised.KW 

## 2017-12-28 DIAGNOSIS — H02051 Trichiasis without entropian right upper eyelid: Secondary | ICD-10-CM | POA: Diagnosis not present

## 2018-01-12 NOTE — Telephone Encounter (Signed)
complete

## 2018-01-19 ENCOUNTER — Other Ambulatory Visit: Payer: No Typology Code available for payment source

## 2018-01-31 DIAGNOSIS — L570 Actinic keratosis: Secondary | ICD-10-CM | POA: Diagnosis not present

## 2018-01-31 DIAGNOSIS — L821 Other seborrheic keratosis: Secondary | ICD-10-CM | POA: Diagnosis not present

## 2018-02-15 ENCOUNTER — Other Ambulatory Visit: Payer: No Typology Code available for payment source

## 2018-02-21 DIAGNOSIS — L821 Other seborrheic keratosis: Secondary | ICD-10-CM | POA: Diagnosis not present

## 2018-02-21 DIAGNOSIS — L570 Actinic keratosis: Secondary | ICD-10-CM | POA: Diagnosis not present

## 2018-03-02 ENCOUNTER — Encounter (INDEPENDENT_AMBULATORY_CARE_PROVIDER_SITE_OTHER): Payer: Medicare Other | Admitting: Ophthalmology

## 2018-03-02 DIAGNOSIS — H338 Other retinal detachments: Secondary | ICD-10-CM

## 2018-03-02 DIAGNOSIS — H35341 Macular cyst, hole, or pseudohole, right eye: Secondary | ICD-10-CM

## 2018-03-02 DIAGNOSIS — H2513 Age-related nuclear cataract, bilateral: Secondary | ICD-10-CM | POA: Diagnosis not present

## 2018-03-02 DIAGNOSIS — H43813 Vitreous degeneration, bilateral: Secondary | ICD-10-CM | POA: Diagnosis not present

## 2018-03-02 DIAGNOSIS — H59031 Cystoid macular edema following cataract surgery, right eye: Secondary | ICD-10-CM | POA: Diagnosis not present

## 2018-03-02 LAB — HM DIABETES EYE EXAM

## 2018-04-24 ENCOUNTER — Encounter: Payer: Self-pay | Admitting: Family Medicine

## 2018-04-24 ENCOUNTER — Ambulatory Visit (INDEPENDENT_AMBULATORY_CARE_PROVIDER_SITE_OTHER): Payer: Medicare Other | Admitting: Family Medicine

## 2018-04-24 VITALS — BP 124/70 | HR 68 | Temp 98.6°F | Resp 16 | Ht 67.0 in | Wt 154.0 lb

## 2018-04-24 DIAGNOSIS — E119 Type 2 diabetes mellitus without complications: Secondary | ICD-10-CM | POA: Diagnosis not present

## 2018-04-24 DIAGNOSIS — N760 Acute vaginitis: Secondary | ICD-10-CM | POA: Diagnosis not present

## 2018-04-24 DIAGNOSIS — I10 Essential (primary) hypertension: Secondary | ICD-10-CM | POA: Diagnosis not present

## 2018-04-24 DIAGNOSIS — K521 Toxic gastroenteritis and colitis: Secondary | ICD-10-CM

## 2018-04-24 DIAGNOSIS — N952 Postmenopausal atrophic vaginitis: Secondary | ICD-10-CM | POA: Diagnosis not present

## 2018-04-24 LAB — POCT GLYCOSYLATED HEMOGLOBIN (HGB A1C): Hemoglobin A1C: 7.9 % — AB (ref 4.0–5.6)

## 2018-04-24 MED ORDER — TERCONAZOLE 0.4 % VA CREA: 1.0000 | TOPICAL_CREAM | Freq: Every day | VAGINAL | 0 refills | Status: DC

## 2018-04-24 MED ORDER — METFORMIN HCL ER 500 MG PO TB24: 500.0000 mg | ORAL_TABLET | Freq: Every day | ORAL | 3 refills | Status: DC

## 2018-04-24 NOTE — Progress Notes (Signed)
Patient: Anna Williams Female    DOB: Oct 06, 1946   72 y.o.   MRN: [id number] Visit Date: 04/24/2018  Today's Provider: Wilhemena Durie, MD   Chief Complaint  Patient presents with  . Diabetes  . Hypertension  . Vaginitis   Subjective:    HPI    Diabetes Mellitus Type II, Follow-up:   Lab Results  Component Value Date   HGBA1C 7.9 (A) 04/24/2018   HGBA1C 7.5 (H) 12/20/2017   HGBA1C 6.8 09/13/2017    Last seen for diabetes 4 months ago.  Management since then includes no changes. She reports good compliance with treatment. She is not having side effects.  Current symptoms include none and have been stable. Home blood sugar records: trend: stable  Episodes of hypoglycemia? no   Current Insulin Regimen: none Most Recent Eye Exam: up to date Weight trend: stable Prior visit with dietician: no Current diet: well balanced Current exercise: no regular exercise  Pertinent Labs:    Component Value Date/Time   CHOL 170 12/20/2017 1132   TRIG 141 12/20/2017 1132   HDL 51 12/20/2017 1132   LDLCALC 91 12/20/2017 1132   CREATININE 0.86 12/20/2017 1132    Wt Readings from Last 3 Encounters:  04/24/18 154 lb (69.9 kg)  12/15/17 159 lb 12.8 oz (72.5 kg)  09/13/17 160 lb (72.6 kg)    Hypertension, follow-up:  BP Readings from Last 3 Encounters:  04/24/18 124/70  12/20/17 122/60  12/15/17 124/62    She was last seen for hypertension 4 months ago.  BP at that visit was 122/60. Management since that visit includes no changes. She reports good compliance with treatment. She is not having side effects.  She is not exercising. She is adherent to low salt diet.   Outside blood pressures are checked occasionally. She is experiencing none.  Patient denies exertional chest pressure/discomfort, lower extremity edema and palpitations.   Cardiovascular risk factors include diabetes mellitus.    Patient also c/o vaginitis. She reports that she is still  having vaginal itching and light vaginal discharge. She has used Vagisil with no relief.   Allergies  Allergen Reactions  . Codeine   . Lmx 4 [Lidocaine]      Current Outpatient Medications:  .  Calcium Citrate-Vitamin D (CALCIUM + D PO), Take 1 tablet by mouth daily., Disp: , Rfl:  .  citalopram (CELEXA) 20 MG tablet, Take 1 tablet (20 mg total) by mouth daily. (Patient taking differently: Take 10 mg by mouth daily. ), Disp: 90 tablet, Rfl: 3 .  empagliflozin (JARDIANCE) 25 MG TABS tablet, Take 25 mg by mouth daily., Disp: 30 tablet, Rfl: 11 .  glucose blood (ONE TOUCH ULTRA TEST) test strip, CHECK SUGAR ONCE DAILY, Disp: 100 each, Rfl: 11 .  hydrOXYzine (ATARAX/VISTARIL) 10 MG tablet, Take 10 mg by mouth every 8 (eight) hours as needed. , Disp: , Rfl:  .  meloxicam (MOBIC) 15 MG tablet, Take 15 mg by mouth daily. , Disp: , Rfl:  .  metFORMIN (GLUCOPHAGE) 500 MG tablet, Take 1 tablet (500 mg total) daily with breakfast by mouth., Disp: 90 tablet, Rfl: 3 .  Multiple Vitamins-Minerals (PRESERVISION/LUTEIN PO), Take by mouth daily. , Disp: , Rfl:  .  omeprazole (PRILOSEC) 20 MG capsule, TAKE 1 CAPSULE (20 MG TOTAL) BY MOUTH DAILY., Disp: 30 capsule, Rfl: 12 .  ONETOUCH DELICA LANCETS FINE MISC, Check sugar once daily, Disp: 50 each, Rfl: 12  Review of Systems  Constitutional: Negative for activity change, appetite change, chills, diaphoresis, fatigue and fever.  HENT: Negative.   Eyes: Negative.   Respiratory: Negative for cough and shortness of breath.   Gastrointestinal: Positive for diarrhea.  Endocrine: Negative for cold intolerance, heat intolerance, polydipsia and polyuria.  Genitourinary: Positive for frequency and vaginal discharge. Negative for decreased urine volume, difficulty urinating, genital sores, pelvic pain, urgency, vaginal bleeding and vaginal pain.  Musculoskeletal: Negative.   Skin: Negative.  Negative for color change and rash.  Allergic/Immunologic: Negative for  environmental allergies.  Neurological: Negative for dizziness, weakness, light-headedness, numbness and headaches.  Psychiatric/Behavioral: Negative.     Social History   Tobacco Use  . Smoking status: Never Smoker  . Smokeless tobacco: Never Used  Substance Use Topics  . Alcohol use: No   Objective:   BP 124/70 (BP Location: Left Arm, Patient Position: Sitting, Cuff Size: Normal)   Pulse 68   Temp 98.6 F (37 C)   Resp 16   Ht 5\' 7"  (1.702 m)   Wt 154 lb (69.9 kg)   BMI 24.12 kg/m  Vitals:   04/24/18 1001  BP: 124/70  Pulse: 68  Resp: 16  Temp: 98.6 F (37 C)  Weight: 154 lb (69.9 kg)  Height: 5\' 7"  (1.702 m)     Physical Exam  Constitutional: She is oriented to person, place, and time. She appears well-developed and well-nourished.  HENT:  Head: Normocephalic and atraumatic.  Right Ear: External ear normal.  Left Ear: External ear normal.  Nose: Nose normal.  Eyes: Conjunctivae are normal. No scleral icterus.  Neck: No thyromegaly present.  Cardiovascular: Normal rate, regular rhythm, normal heart sounds and intact distal pulses.  Pulmonary/Chest: Effort normal and breath sounds normal.  Abdominal: Soft.  Musculoskeletal: She exhibits no edema.  Neurological: She is alert and oriented to person, place, and time.  Skin: Skin is warm and dry.  Psychiatric: She has a normal mood and affect. Her behavior is normal. Judgment and thought content normal.  Wet Prep positive for yeast.      Assessment & Plan:     1. Diabetes mellitus type 2 without retinopathy ([place])  - POCT glycosylated hemoglobin (Hb A1C) - metFORMIN (GLUCOPHAGE-XR) 500 MG 24 hr tablet; Take 1 tablet (500 mg total) by mouth daily with breakfast.  Dispense: 90 tablet; Refill: 3  2. Essential hypertension   3. Vaginal atrophy   4. Acute vaginitis/yeast F/u with Gyn. - terconazole (TERAZOL 7) 0.4 % vaginal cream; Place 1 applicator vaginally at bedtime.  Dispense: 45 g; Refill:  0 5.Diarrhea  Probably due to metformin--try XR daily.  I have done the exam and reviewed the chart and it is accurate to the best of my knowledge. Development worker, community has been used and  any errors in dictation or transcription are unintentional. Miguel Aschoff M.D. [place], MD  [facility]

## 2018-05-10 ENCOUNTER — Other Ambulatory Visit: Payer: Self-pay | Admitting: Obstetrics and Gynecology

## 2018-05-19 NOTE — Progress Notes (Signed)
ANNUAL PREVENTATIVE CARE GYN  ENCOUNTER NOTE  Subjective:       Anna Williams is a 72 y.o. G60P2002 female here for a routine annual gynecologic exam.  Current complaints:  1. none.  Treated recently for vaginitis; asymptomatic at this time.  No postmenopausal bleeding.  No vaginal discharge.    Bowel function is normal- loose stool d/t metformin. Bladder function is normal Patient is not sexually active. 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   Gynecologic History No LMP recorded. Patient is postmenopausal. Contraception: post menopausal status Last Pap: 2016 neg. Results were: normal Last mammogram: 06/2017 birad 1. Results were: normal  Obstetric History OB History  Gravida Para Term Preterm AB Living  2 2 2     2   SAB TAB Ectopic Multiple Live Births          2    # Outcome Date GA Lbr Len/2nd Weight Sex Delivery Anes PTL Lv  2 Term 1978    M Vag-Spont   LIV  1 Term 1964    M Vag-Spont   LIV    Past Medical History:  Diagnosis Date  . Anxiety and depression   . ASCUS with positive high risk HPV 09/2013  . Diabetes mellitus without complication ([place])    type 2  . Elevated blood sugar   . Family history of breast cancer in mother   . Fibroid uterus   . History of pulmonary embolus ([place])   . Hypertension   . Menopause   . Mild dysplasia of cervix 09/2013   repeat pap done -ecc neg, cerival bx cin 1  . Retinal detachment     Past Surgical History:  Procedure Laterality Date  . anterior neck fusion    . BREAST CYST ASPIRATION Left yrs ago  . CHOLECYSTECTOMY  2010  . DILATION AND CURETTAGE OF UTERUS    . HYSTEROSCOPY    . mole removed  2014    Current Outpatient Medications on File Prior to Visit  Medication Sig Dispense Refill  . Calcium Citrate-Vitamin D (CALCIUM + D PO) Take 1 tablet by mouth daily.    . citalopram (CELEXA) 20 MG tablet Take 1 tablet (20 mg total) by mouth daily. (Patient taking  differently: Take 10 mg by mouth daily. ) 90 tablet 3  . empagliflozin (JARDIANCE) 25 MG TABS tablet Take 25 mg by mouth daily. 30 tablet 11  . glucose blood (ONE TOUCH ULTRA TEST) test strip CHECK SUGAR ONCE DAILY 100 each 11  . hydrOXYzine (ATARAX/VISTARIL) 10 MG tablet Take 10 mg by mouth every 8 (eight) hours as needed.     . meloxicam (MOBIC) 15 MG tablet Take 15 mg by mouth daily.     . metFORMIN (GLUCOPHAGE-XR) 500 MG 24 hr tablet Take 1 tablet (500 mg total) by mouth daily with breakfast. 90 tablet 3  . Multiple Vitamins-Minerals (PRESERVISION/LUTEIN PO) Take by mouth daily.     Marland Kitchen omeprazole (PRILOSEC) 20 MG capsule TAKE 1 CAPSULE (20 MG TOTAL) BY MOUTH DAILY. 30 capsule 12  . ONETOUCH DELICA LANCETS FINE MISC Check sugar once daily 50 each 12  . terconazole (TERAZOL 7) 0.4 % vaginal cream Place 1 applicator vaginally at bedtime. 45 g 0   No current facility-administered medications on file prior to visit.     Allergies  Allergen Reactions  . Codeine   . Lmx 4 [Lidocaine]     Social History   Socioeconomic  History  . Marital status: Married    Spouse name: Not on file  . Number of children: 2  . Years of education: Not on file  . Highest education level: Associate degree: occupational, Hotel manager, or vocational program  Occupational History  . Occupation: retired  Scientific laboratory technician  . Financial resource strain: Not hard at all  . Food insecurity:    Worry: Never true    Inability: Never true  . Transportation needs:    Medical: No    Non-medical: No  Tobacco Use  . Smoking status: Never Smoker  . Smokeless tobacco: Never Used  Substance and Sexual Activity  . Alcohol use: No  . Drug use: No  . Sexual activity: Never    Birth control/protection: Post-menopausal  Lifestyle  . Physical activity:    Days per week: Not on file    Minutes per session: Not on file  . Stress: To some extent  Relationships  . Social connections:    Talks on phone: Not on file    Gets  together: Not on file    Attends religious service: Not on file    Active member of club or organization: Not on file    Attends meetings of clubs or organizations: Not on file    Relationship status: Not on file  . Intimate partner violence:    Fear of current or ex partner: Not on file    Emotionally abused: Not on file    Physically abused: Not on file    Forced sexual activity: Not on file  Other Topics Concern  . Not on file  Social History Narrative  . Not on file    Family History  Problem Relation Age of Onset  . Breast cancer Mother 46  . Heart disease Father   . Throat cancer Brother   . Heart disease Brother   . Lung cancer Brother   . Breast cancer Maternal Aunt   . Colon cancer Neg Hx   . Ovarian cancer Neg Hx     The following portions of the patient's history were reviewed and updated as appropriate: allergies, current medications, past family history, past medical history, past social history, past surgical history and problem list.  Review of Systems Review of Systems  Constitutional: Negative.   HENT: Negative.   Eyes: Negative.   Respiratory: Negative.   Cardiovascular: Negative.   Gastrointestinal: Negative.   Genitourinary: Positive for frequency.       Metformin side effect  Musculoskeletal: Negative.   Skin: Negative.   Neurological: Negative.   Endo/Heme/Allergies: Negative.   Psychiatric/Behavioral: Negative.        Objective:   BP 134/74   Pulse 75   Ht 5\' 7"  (1.702 m)   Wt 154 lb 1.6 oz (69.9 kg)   BMI 24.14 kg/m  CONSTITUTIONAL: Well-developed, well-nourished female in no acute distress.  PSYCHIATRIC: Normal mood and affect. Normal behavior. Normal judgment and thought content. [place]: Alert and oriented to person, place, and time. Normal muscle tone coordination. No cranial nerve deficit noted. HENT:  Normocephalic, atraumatic, External right and left ear normal.  EYES: Conjunctivae and EOM are normal. No scleral icterus.   NECK: Normal range of motion, supple, no masses.  Normal thyroid.  SKIN: Skin is warm and dry. No rash noted. Not diaphoretic. No erythema. No pallor. CARDIOVASCULAR: Normal heart rate noted, regular rhythm, no murmur. RESPIRATORY: Clear to auscultation bilaterally. Effort and breath sounds normal, no problems with respiration noted. BREASTS: Symmetric in size. No  masses, skin changes, nipple drainage, or lymphadenopathy. ABDOMEN: Soft, normal bowel sounds, no distention noted.  No tenderness, rebound or guarding.  BLADDER: Normal PELVIC:  External Genitalia: Atrophic changes without rash, without epithelial skin breakdown  BUS: Normal  Vagina: Moderate atrophy; no discharge; single digit exam  Cervix: Normal; no lesions  Uterus: Normal; midplane, normal size and shape, nontender  Adnexa: Normal; nonpalpable nontender  RV: External Exam NormaI, No Rectal Masses and Normal Sphincter tone  MUSCULOSKELETAL: Normal range of motion. No tenderness.  No cyanosis, clubbing, or edema.  2+ distal pulses. LYMPHATIC: No Axillary, Supraclavicular, or Inguinal Adenopathy.    Assessment:   Annual gynecologic examination 72 y.o. Contraception: post menopausal status Normal BMI Vaginal atrophy, asymptomatic Vulvar atrophy, asymptomatic  Plan:  Pap: Not needed Mammogram: Ordered DEXA scan ordered Stool Guaiac Testing:  Ordered Labs: thru pcp Routine preventative health maintenance measures emphasized: Exercise/Diet/Weight control, Tobacco Warnings and Alcohol/Substance use risks Return to [place], CMA  Brayton Mars, MD   Note: This dictation was prepared with Dragon dictation along with smaller phrase technology. Any transcriptional errors that result from this process are unintentional.

## 2018-05-31 ENCOUNTER — Encounter: Payer: Self-pay | Admitting: Obstetrics and Gynecology

## 2018-05-31 ENCOUNTER — Ambulatory Visit (INDEPENDENT_AMBULATORY_CARE_PROVIDER_SITE_OTHER): Payer: Medicare Other | Admitting: Obstetrics and Gynecology

## 2018-05-31 VITALS — BP 134/74 | HR 75 | Ht 67.0 in | Wt 154.1 lb

## 2018-05-31 DIAGNOSIS — N952 Postmenopausal atrophic vaginitis: Secondary | ICD-10-CM | POA: Diagnosis not present

## 2018-05-31 DIAGNOSIS — Z1231 Encounter for screening mammogram for malignant neoplasm of breast: Secondary | ICD-10-CM | POA: Diagnosis not present

## 2018-05-31 DIAGNOSIS — Z780 Asymptomatic menopausal state: Secondary | ICD-10-CM | POA: Diagnosis not present

## 2018-05-31 DIAGNOSIS — Z1211 Encounter for screening for malignant neoplasm of colon: Secondary | ICD-10-CM | POA: Diagnosis not present

## 2018-05-31 DIAGNOSIS — F419 Anxiety disorder, unspecified: Secondary | ICD-10-CM

## 2018-05-31 NOTE — Patient Instructions (Signed)
1.  Pap smear is not done.  No further Paps are needed. 2.  Mammogram is ordered. 3.  Stool guaiac cards are given for colon cancer screening 4.  Screening labs are through primary care 5.  Continue with healthy eating and exercise 6.  Continue with calcium and vitamin D supplementation daily 7.  DEXA scan is ordered 8.  Return in 1 year for annual exam   Health Maintenance for Postmenopausal Women Menopause is a normal process in which your reproductive ability comes to an end. This process happens gradually over a span of months to years, usually between the ages of 81 and 25. Menopause is complete when you have missed 12 consecutive menstrual periods. It is important to talk with your health care provider about some of the most common conditions that affect postmenopausal women, such as heart disease, cancer, and bone loss (osteoporosis). Adopting a healthy lifestyle and getting preventive care can help to promote your health and wellness. Those actions can also lower your chances of developing some of these common conditions. What should I know about menopause? During menopause, you may experience a number of symptoms, such as:  Moderate-to-severe hot flashes.  Night sweats.  Decrease in sex drive.  Mood swings.  Headaches.  Tiredness.  Irritability.  Memory problems.  Insomnia.  Choosing to treat or not to treat menopausal changes is an individual decision that you make with your health care provider. What should I know about hormone replacement therapy and supplements? Hormone therapy products are effective for treating symptoms that are associated with menopause, such as hot flashes and night sweats. Hormone replacement carries certain risks, especially as you become older. If you are thinking about using estrogen or estrogen with progestin treatments, discuss the benefits and risks with your health care provider. What should I know about heart disease and stroke? Heart  disease, heart attack, and stroke become more likely as you age. This may be due, in part, to the hormonal changes that your body experiences during menopause. These can affect how your body processes dietary fats, triglycerides, and cholesterol. Heart attack and stroke are both medical emergencies. There are many things that you can do to help prevent heart disease and stroke:  Have your blood pressure checked at least every 1-2 years. High blood pressure causes heart disease and increases the risk of stroke.  If you are 31-47 years old, ask your health care provider if you should take aspirin to prevent a heart attack or a stroke.  Do not use any tobacco products, including cigarettes, chewing tobacco, or electronic cigarettes. If you need help quitting, ask your health care provider.  It is important to eat a healthy diet and maintain a healthy weight. ? Be sure to include plenty of vegetables, fruits, low-fat dairy products, and lean protein. ? Avoid eating foods that are high in solid fats, added sugars, or salt (sodium).  Get regular exercise. This is one of the most important things that you can do for your health. ? Try to exercise for at least 150 minutes each week. The type of exercise that you do should increase your heart rate and make you sweat. This is known as moderate-intensity exercise. ? Try to do strengthening exercises at least twice each week. Do these in addition to the moderate-intensity exercise.  Know your numbers.Ask your health care provider to check your cholesterol and your blood glucose. Continue to have your blood tested as directed by your health care provider.  What should  I know about cancer screening? There are several types of cancer. Take the following steps to reduce your risk and to catch any cancer development as early as possible. Breast Cancer  Practice breast self-awareness. ? This means understanding how your breasts normally appear and feel. ? It  also means doing regular breast self-exams. Let your health care provider know about any changes, no matter how small.  If you are 28 or older, have a clinician do a breast exam (clinical breast exam or CBE) every year. Depending on your age, family history, and medical history, it may be recommended that you also have a yearly breast X-ray (mammogram).  If you have a family history of breast cancer, talk with your health care provider about genetic screening.  If you are at high risk for breast cancer, talk with your health care provider about having an MRI and a mammogram every year.  Breast cancer (BRCA) gene test is recommended for women who have family members with BRCA-related cancers. Results of the assessment will determine the need for genetic counseling and BRCA1 and for BRCA2 testing. BRCA-related cancers include these types: ? Breast. This occurs in males or females. ? Ovarian. ? Tubal. This may also be called fallopian tube cancer. ? Cancer of the abdominal or pelvic lining (peritoneal cancer). ? Prostate. ? Pancreatic.  Cervical, Uterine, and Ovarian Cancer Your health care provider may recommend that you be screened regularly for cancer of the pelvic organs. These include your ovaries, uterus, and vagina. This screening involves a pelvic exam, which includes checking for microscopic changes to the surface of your cervix (Pap test).  For women ages 21-65, health care providers may recommend a pelvic exam and a Pap test every three years. For women ages 24-65, they may recommend the Pap test and pelvic exam, combined with testing for human papilloma virus (HPV), every five years. Some types of HPV increase your risk of cervical cancer. Testing for HPV may also be done on women of any age who have unclear Pap test results.  Other health care providers may not recommend any screening for nonpregnant women who are considered low risk for pelvic cancer and have no symptoms. Ask your  health care provider if a screening pelvic exam is right for you.  If you have had past treatment for cervical cancer or a condition that could lead to cancer, you need Pap tests and screening for cancer for at least 20 years after your treatment. If Pap tests have been discontinued for you, your risk factors (such as having a new sexual partner) need to be reassessed to determine if you should start having screenings again. Some women have medical problems that increase the chance of getting cervical cancer. In these cases, your health care provider may recommend that you have screening and Pap tests more often.  If you have a family history of uterine cancer or ovarian cancer, talk with your health care provider about genetic screening.  If you have vaginal bleeding after reaching menopause, tell your health care provider.  There are currently no reliable tests available to screen for ovarian cancer.  Lung Cancer Lung cancer screening is recommended for adults 48-64 years old who are at high risk for lung cancer because of a history of smoking. A yearly low-dose CT scan of the lungs is recommended if you:  Currently smoke.  Have a history of at least 30 pack-years of smoking and you currently smoke or have quit within the past 15 years.  A pack-year is smoking an average of one pack of cigarettes per day for one year.  Yearly screening should:  Continue until it has been 15 years since you quit.  Stop if you develop a health problem that would prevent you from having lung cancer treatment.  Colorectal Cancer  This type of cancer can be detected and can often be prevented.  Routine colorectal cancer screening usually begins at age 51 and continues through age 24.  If you have risk factors for colon cancer, your health care provider may recommend that you be screened at an earlier age.  If you have a family history of colorectal cancer, talk with your health care provider about genetic  screening.  Your health care provider may also recommend using home test kits to check for hidden blood in your stool.  A small camera at the end of a tube can be used to examine your colon directly (sigmoidoscopy or colonoscopy). This is done to check for the earliest forms of colorectal cancer.  Direct examination of the colon should be repeated every 5-10 years until age 15. However, if early forms of precancerous polyps or small growths are found or if you have a family history or genetic risk for colorectal cancer, you may need to be screened more often.  Skin Cancer  Check your skin from head to toe regularly.  Monitor any moles. Be sure to tell your health care provider: ? About any new moles or changes in moles, especially if there is a change in a mole's shape or color. ? If you have a mole that is larger than the size of a pencil eraser.  If any of your family members has a history of skin cancer, especially at a young age, talk with your health care provider about genetic screening.  Always use sunscreen. Apply sunscreen liberally and repeatedly throughout the day.  Whenever you are outside, protect yourself by wearing long sleeves, pants, a wide-brimmed hat, and sunglasses.  What should I know about osteoporosis? Osteoporosis is a condition in which bone destruction happens more quickly than new bone creation. After menopause, you may be at an increased risk for osteoporosis. To help prevent osteoporosis or the bone fractures that can happen because of osteoporosis, the following is recommended:  If you are 53-80 years old, get at least 1,000 mg of calcium and at least 600 mg of vitamin D per day.  If you are older than age 9 but younger than age 62, get at least 1,200 mg of calcium and at least 600 mg of vitamin D per day.  If you are older than age 7, get at least 1,200 mg of calcium and at least 800 mg of vitamin D per day.  Smoking and excessive alcohol intake  increase the risk of osteoporosis. Eat foods that are rich in calcium and vitamin D, and do weight-bearing exercises several times each week as directed by your health care provider. What should I know about how menopause affects my mental health? Depression may occur at any age, but it is more common as you become older. Common symptoms of depression include:  Low or sad mood.  Changes in sleep patterns.  Changes in appetite or eating patterns.  Feeling an overall lack of motivation or enjoyment of activities that you previously enjoyed.  Frequent crying spells.  Talk with your health care provider if you think that you are experiencing depression. What should I know about immunizations? It is important that you  get and maintain your immunizations. These include:  Tetanus, diphtheria, and pertussis (Tdap) booster vaccine.  Influenza every year before the flu season begins.  Pneumonia vaccine.  Shingles vaccine.  Your health care provider may also recommend other immunizations. This information is not intended to replace advice given to you by your health care provider. Make sure you discuss any questions you have with your health care provider. Document Released: 11/19/2005 Document Revised: 04/16/2016 Document Reviewed: 07/01/2015 Elsevier Interactive Patient Education  2018 [employer].

## 2018-06-28 ENCOUNTER — Other Ambulatory Visit: Payer: No Typology Code available for payment source

## 2018-06-30 ENCOUNTER — Telehealth: Payer: Self-pay | Admitting: Family Medicine

## 2018-06-30 NOTE — Telephone Encounter (Signed)
Pt wanting a call back from [facility] regarding her Rx -  empagliflozin (JARDIANCE) 25 MG TABS tablet  Pt only wanting to speak to [place] regarding medication.  Thanks, [employer]

## 2018-07-03 MED ORDER — MELOXICAM 15 MG PO TABS: 15.0000 mg | ORAL_TABLET | Freq: Every day | ORAL | 3 refills | Status: DC

## 2018-07-03 NOTE — Telephone Encounter (Signed)
Patient in donut hole, requested some samples.  Samples given

## 2018-07-07 DIAGNOSIS — X32XXXS Exposure to sunlight, sequela: Secondary | ICD-10-CM | POA: Diagnosis not present

## 2018-07-07 DIAGNOSIS — D225 Melanocytic nevi of trunk: Secondary | ICD-10-CM | POA: Diagnosis not present

## 2018-07-07 DIAGNOSIS — L821 Other seborrheic keratosis: Secondary | ICD-10-CM | POA: Diagnosis not present

## 2018-07-07 DIAGNOSIS — L578 Other skin changes due to chronic exposure to nonionizing radiation: Secondary | ICD-10-CM | POA: Diagnosis not present

## 2018-07-18 ENCOUNTER — Ambulatory Visit
Admission: RE | Admit: 2018-07-18 | Discharge: 2018-07-18 | Disposition: A | Discharge status: Home / Self Care | Payer: Medicare Other | Source: Ambulatory Visit | Attending: Obstetrics and Gynecology | Admitting: Obstetrics and Gynecology

## 2018-07-18 DIAGNOSIS — Z780 Asymptomatic menopausal state: Secondary | ICD-10-CM | POA: Insufficient documentation

## 2018-07-18 DIAGNOSIS — Z1231 Encounter for screening mammogram for malignant neoplasm of breast: Secondary | ICD-10-CM | POA: Insufficient documentation

## 2018-08-28 ENCOUNTER — Ambulatory Visit (INDEPENDENT_AMBULATORY_CARE_PROVIDER_SITE_OTHER): Payer: Medicare Other | Admitting: Family Medicine

## 2018-08-28 VITALS — BP 134/78 | HR 70 | Temp 97.7°F | Resp 16 | Wt 151.0 lb

## 2018-08-28 DIAGNOSIS — I10 Essential (primary) hypertension: Secondary | ICD-10-CM | POA: Diagnosis not present

## 2018-08-28 DIAGNOSIS — Z23 Encounter for immunization: Secondary | ICD-10-CM

## 2018-08-28 DIAGNOSIS — F419 Anxiety disorder, unspecified: Secondary | ICD-10-CM

## 2018-08-28 DIAGNOSIS — Z86711 Personal history of pulmonary embolism: Secondary | ICD-10-CM

## 2018-08-28 DIAGNOSIS — E119 Type 2 diabetes mellitus without complications: Secondary | ICD-10-CM

## 2018-08-28 LAB — POCT GLYCOSYLATED HEMOGLOBIN (HGB A1C): Hemoglobin A1C: 7.5 % — AB (ref 4.0–5.6)

## 2018-08-28 NOTE — Progress Notes (Signed)
RACINE ERBY  MRN: [id number] DOB: [birth year]  Subjective:  HPI  The patient is a 72 year old female who presents for follow up of diabetes.  She was last seen on 04/24/18.  Her A1C on that day was 7.9.  She reports her home readings run between 125-150. She is doing well but having a lot of family stress.  She has a brother that is dying of cancer.  Her second husband is very supportive of her trying to help take care of her first husband who is the father of her son.  Her first husband is an alcoholic who has had several strokes and requires people to check on him all the time.  Overall she has been doing very well.  Patient Active Problem List   Diagnosis Date Noted  . Vaginal atrophy 04/29/2016  . Fibroid, uterine 04/29/2016  . Pelvic pain in female 04/29/2016  . Hypertension 10/16/2015  . Diabetes mellitus type 2 without retinopathy ([place]) 05/27/2015  . Menopause 04/29/2015  . Fibroids 04/29/2015  . Anxiety 04/29/2015  . Cervical dysplasia 04/29/2015  . Prediabetes 04/29/2015  . History of pulmonary embolus (PE) 04/29/2015  . Family history of breast cancer in mother 04/29/2015  . Retinal detachment 04/29/2015    Past Medical History:  Diagnosis Date  . Anxiety and depression   . ASCUS with positive high risk HPV 09/2013  . Diabetes mellitus without complication ([place])    type 2  . Elevated blood sugar   . Family history of breast cancer in mother   . Fibroid uterus   . History of pulmonary embolus (PE)   . Hypertension   . Menopause   . Mild dysplasia of cervix 09/2013   repeat pap done -ecc neg, cerival bx cin 1  . Retinal detachment     Social History   Socioeconomic History  . Marital status: Married    Spouse name: Not on file  . Number of children: 2  . Years of education: Not on file  . Highest education level: Associate degree: occupational, Hotel manager, or vocational program  Occupational History  . Occupation: retired  Scientific laboratory technician  . Financial  resource strain: Not hard at all  . Food insecurity:    Worry: Never true    Inability: Never true  . Transportation needs:    Medical: No    Non-medical: No  Tobacco Use  . Smoking status: Never Smoker  . Smokeless tobacco: Never Used  Substance and Sexual Activity  . Alcohol use: No  . Drug use: No  . Sexual activity: Not Currently    Birth control/protection: Post-menopausal  Lifestyle  . Physical activity:    Days per week: 2 days    Minutes per session: 30 min  . Stress: To some extent  Relationships  . Social connections:    Talks on phone: Not on file    Gets together: Not on file    Attends religious service: Not on file    Active member of club or organization: Not on file    Attends meetings of clubs or organizations: Not on file    Relationship status: Not on file  . Intimate partner violence:    Fear of current or ex partner: Not on file    Emotionally abused: Not on file    Physically abused: Not on file    Forced sexual activity: Not on file  Other Topics Concern  . Not on file  Social History Narrative  . Not  on file    Outpatient Encounter Medications as of 08/28/2018  Medication Sig  . Calcium Citrate-Vitamin D (CALCIUM + D PO) Take 1 tablet by mouth daily.  . citalopram (CELEXA) 20 MG tablet Take 1 tablet (20 mg total) by mouth daily. (Patient taking differently: Take 10 mg by mouth daily. )  . empagliflozin (JARDIANCE) 25 MG TABS tablet Take 25 mg by mouth daily.  Marland Kitchen glucose blood (ONE TOUCH ULTRA TEST) test strip CHECK SUGAR ONCE DAILY  . meloxicam (MOBIC) 15 MG tablet Take 1 tablet (15 mg total) by mouth daily.  . metFORMIN (GLUCOPHAGE-XR) 500 MG 24 hr tablet Take 1 tablet (500 mg total) by mouth daily with breakfast.  . Multiple Vitamins-Minerals (PRESERVISION/LUTEIN PO) Take by mouth daily.   Marland Kitchen omeprazole (PRILOSEC) 20 MG capsule TAKE 1 CAPSULE (20 MG TOTAL) BY MOUTH DAILY.  Marland Kitchen ONETOUCH DELICA LANCETS FINE MISC Check sugar once daily   No  facility-administered encounter medications on file as of 08/28/2018.     Allergies  Allergen Reactions  . Codeine   . Lmx 4 [Lidocaine]     Review of Systems  Constitutional: Negative for fever and malaise/fatigue.  Eyes: Negative.   Respiratory: Negative for cough, shortness of breath and wheezing.   Cardiovascular: Negative for chest pain, palpitations, orthopnea, claudication and leg swelling.  Gastrointestinal: Negative.   Skin: Negative.   Endo/Heme/Allergies: Negative.   Psychiatric/Behavioral: Negative.     Objective:  BP 134/78 (BP Location: Right Arm, Patient Position: Sitting, Cuff Size: Normal)   Pulse 70   Temp 97.7 F (36.5 C) (Oral)   Resp 16   Wt 151 lb (68.5 kg)   SpO2 98%   BMI 23.65 kg/m   Physical Exam  Constitutional: She is oriented to person, place, and time and well-developed, well-nourished, and in no distress.  HENT:  Head: Normocephalic and atraumatic.  Eyes: Conjunctivae are normal. No scleral icterus.  Neck: No thyromegaly present.  Cardiovascular: Normal rate, regular rhythm and normal heart sounds.  Pulmonary/Chest: Effort normal and breath sounds normal.  Abdominal: Soft.  Musculoskeletal: She exhibits no edema.  Neurological: She is alert and oriented to person, place, and time. Gait normal. GCS score is 15.  Skin: Skin is warm and dry.  Psychiatric: Mood, memory, affect and judgment normal.    Assessment and Plan :  1. Diabetes mellitus type 2 without retinopathy ([place]) [place] much better controlled this year.  Continue to work on habits.  Turn to clinic 4 months - POCT glycosylated hemoglobin (Hb A1C)--7.5 today Lipids on next visit.  Last lipids were good. 2. Essential hypertension  Good control. 3. Need for influenza vaccination  - Flu vaccine HIGH DOSE PF (Fluzone High dose)  4. Anxiety He is coping well presently.  Family issues as mentioned above.  5. History of pulmonary embolus (PE)  I have done the exam and  reviewed the chart and it is accurate to the best of my knowledge. Development worker, community has been used and  any errors in dictation or transcription are unintentional. Miguel Aschoff M.D. [facility]

## 2018-09-13 ENCOUNTER — Encounter (INDEPENDENT_AMBULATORY_CARE_PROVIDER_SITE_OTHER): Payer: Medicare Other | Admitting: Ophthalmology

## 2018-09-26 DIAGNOSIS — J0140 Acute pansinusitis, unspecified: Secondary | ICD-10-CM | POA: Diagnosis not present

## 2018-09-26 DIAGNOSIS — T3695XA Adverse effect of unspecified systemic antibiotic, initial encounter: Secondary | ICD-10-CM | POA: Diagnosis not present

## 2018-09-26 DIAGNOSIS — B379 Candidiasis, unspecified: Secondary | ICD-10-CM | POA: Diagnosis not present

## 2018-09-26 DIAGNOSIS — E119 Type 2 diabetes mellitus without complications: Secondary | ICD-10-CM | POA: Diagnosis not present

## 2018-09-30 ENCOUNTER — Other Ambulatory Visit: Payer: Self-pay | Admitting: Family Medicine

## 2018-09-30 DIAGNOSIS — K219 Gastro-esophageal reflux disease without esophagitis: Secondary | ICD-10-CM

## 2018-10-02 ENCOUNTER — Other Ambulatory Visit: Payer: Self-pay | Admitting: Family Medicine

## 2018-10-02 DIAGNOSIS — E119 Type 2 diabetes mellitus without complications: Secondary | ICD-10-CM

## 2018-10-16 ENCOUNTER — Encounter (INDEPENDENT_AMBULATORY_CARE_PROVIDER_SITE_OTHER): Payer: Medicare Other | Admitting: Ophthalmology

## 2018-10-16 DIAGNOSIS — J0110 Acute frontal sinusitis, unspecified: Secondary | ICD-10-CM | POA: Diagnosis not present

## 2018-11-02 ENCOUNTER — Ambulatory Visit (INDEPENDENT_AMBULATORY_CARE_PROVIDER_SITE_OTHER): Payer: Medicare Other | Admitting: Family Medicine

## 2018-11-02 ENCOUNTER — Encounter: Payer: Self-pay | Admitting: Family Medicine

## 2018-11-02 VITALS — BP 132/66 | HR 82 | Temp 98.0°F | Wt 150.2 lb

## 2018-11-02 DIAGNOSIS — I10 Essential (primary) hypertension: Secondary | ICD-10-CM | POA: Diagnosis not present

## 2018-11-02 DIAGNOSIS — E119 Type 2 diabetes mellitus without complications: Secondary | ICD-10-CM

## 2018-11-02 MED ORDER — AMLODIPINE BESYLATE 2.5 MG PO TABS: 2.5000 mg | ORAL_TABLET | Freq: Every day | ORAL | 3 refills | Status: DC

## 2018-11-02 NOTE — Progress Notes (Signed)
am      Patient: Anna Williams Female    DOB: [birth year]   73 y.o.   MRN: [id number] Visit Date: 11/02/2018  Today's Provider: Wilhemena Durie, MD   No chief complaint on file.  Subjective:     HPI  Patient comes in today to discuss BP issues. She reports that she was having a cosmetic surgery done 2 days ago, and they had to stop the procedure due to her BP being to high. She denies any symptoms. She does mention that she took Amoxil, Doxy, and Mucinex-DM a few days before.   Blood pressure evidently spiked to about 170/100 and they discontinued the surgery.  She is completely asymptomatic and has been.  Chest pain, orthopnea, PND, shortness of breath.   No syncope Or presyncope.  Allergies  Allergen Reactions  . Codeine   . Lmx 4 [Lidocaine]      Current Outpatient Medications:  .  Calcium Citrate-Vitamin D (CALCIUM + D PO), Take 1 tablet by mouth daily., Disp: , Rfl:  .  citalopram (CELEXA) 20 MG tablet, Take 1 tablet (20 mg total) by mouth daily. (Patient taking differently: Take 10 mg by mouth daily. ), Disp: 90 tablet, Rfl: 3 .  glucose blood (ONE TOUCH ULTRA TEST) test strip, CHECK SUGAR ONCE DAILY, Disp: 100 each, Rfl: 11 .  JARDIANCE 25 MG TABS tablet, TAKE 1 TABLET BY MOUTH DAILY, Disp: 30 tablet, Rfl: 11 .  meloxicam (MOBIC) 15 MG tablet, Take 1 tablet (15 mg total) by mouth daily., Disp: 30 tablet, Rfl: 3 .  metFORMIN (GLUCOPHAGE-XR) 500 MG 24 hr tablet, Take 1 tablet (500 mg total) by mouth daily with breakfast., Disp: 90 tablet, Rfl: 3 .  Multiple Vitamins-Minerals (PRESERVISION/LUTEIN PO), Take by mouth daily. , Disp: , Rfl:  .  omeprazole (PRILOSEC) 20 MG capsule, TAKE 1 CAPSULE BY MOUTH EVERY DAY, Disp: 90 capsule, Rfl: 4 .  ONETOUCH DELICA LANCETS FINE MISC, Check sugar once daily, Disp: 50 each, Rfl: 12  Review of Systems  Constitutional: Negative for activity change and fatigue.  HENT: Negative.   Eyes: Negative.   Respiratory: Negative for cough and  shortness of breath.   Cardiovascular: Negative for chest pain, palpitations and leg swelling.  Gastrointestinal: Negative.   Allergic/Immunologic: Negative.   Neurological: Negative for dizziness, light-headedness and headaches.  Psychiatric/Behavioral: Negative.     Social History   Tobacco Use  . Smoking status: Never Smoker  . Smokeless tobacco: Never Used  Substance Use Topics  . Alcohol use: No      Objective:   BP 132/66 (BP Location: Left Arm, Patient Position: Sitting, Cuff Size: Normal)   Pulse 82   Temp 98 F (36.7 C)   Wt 150 lb 3.2 oz (68.1 kg)   SpO2 98%   BMI 23.52 kg/m  Vitals:   11/02/18 1617  BP: 132/66  Pulse: 82  Temp: 98 F (36.7 C)  SpO2: 98%  Weight: 150 lb 3.2 oz (68.1 kg)     Physical Exam Vitals signs reviewed.  Constitutional:      Appearance: She is well-developed.  HENT:     Right Ear: External ear normal.     Left Ear: External ear normal.     Mouth/Throat:     Pharynx: Oropharynx is clear.  Eyes:     Conjunctiva/sclera: Conjunctivae normal.     Pupils: Pupils are equal, round, and reactive to light.  Neck:     Musculoskeletal: Normal range of motion and  neck supple.  Cardiovascular:     Rate and Rhythm: Normal rate and regular rhythm.     Heart sounds: Normal heart sounds.  Pulmonary:     Effort: Pulmonary effort is normal.     Breath sounds: Normal breath sounds.  Abdominal:     Palpations: Abdomen is soft.  Musculoskeletal: Normal range of motion.  Skin:    General: Skin is warm and dry.  Neurological:     General: No focal deficit present.     Mental Status: She is alert and oriented to person, place, and time.     Deep Tendon Reflexes: Reflexes are normal and symmetric.  Psychiatric:        Mood and Affect: Mood normal.        Behavior: Behavior normal.        Thought Content: Thought content normal.        Judgment: Judgment normal.    ECG reveals normal sinus rhythm with no ischemic changes.       Assessment & Plan       1. Essential hypertension Good blood pressure today.  On amlodipine 2.5 mg daily until her next visit so she can have the her surgery that she wishes to have. - amLODipine (NORVASC) 2.5 MG tablet; Take 1 tablet (2.5 mg total) by mouth daily.  Dispense: 30 tablet; Refill: 3 - EKG 12-Lead  2. Diabetes mellitus type 2 without retinopathy ([place])   I have done the exam and reviewed the above chart and it is accurate to the best of my knowledge. Development worker, community has been used in this note in any air is in the dictation or transcription are unintentional.  Wilhemena Durie, MD  [place]

## 2018-11-07 ENCOUNTER — Telehealth: Payer: Self-pay | Admitting: Family Medicine

## 2018-11-07 NOTE — Telephone Encounter (Signed)
Advised  ED 

## 2018-11-07 NOTE — Telephone Encounter (Signed)
Pt calling to discuss and check on status of a letter Dr. Rosanna Randy is writing for her.  Please advise.  Thanks, [employer]

## 2018-11-07 NOTE — Telephone Encounter (Signed)
Patient returning Elena's call.

## 2018-11-07 NOTE — Telephone Encounter (Signed)
Have you done note for her to have clearance for cosmetic surgery

## 2018-11-28 DIAGNOSIS — L2089 Other atopic dermatitis: Secondary | ICD-10-CM | POA: Diagnosis not present

## 2018-11-28 DIAGNOSIS — L570 Actinic keratosis: Secondary | ICD-10-CM | POA: Diagnosis not present

## 2018-11-28 DIAGNOSIS — L821 Other seborrheic keratosis: Secondary | ICD-10-CM | POA: Diagnosis not present

## 2018-12-04 ENCOUNTER — Ambulatory Visit: Payer: Self-pay | Admitting: Family Medicine

## 2018-12-05 IMAGING — MG MM DIGITAL SCREENING BILAT W/ TOMO W/ CAD
8 series · 8 of 24 positions shown · non-contrast
Comparison: Previous exam(s).

CLINICAL DATA: Screening.

EXAM:
DIGITAL SCREENING BILATERAL MAMMOGRAM WITH TOMO AND CAD

[L CC synth-2D]
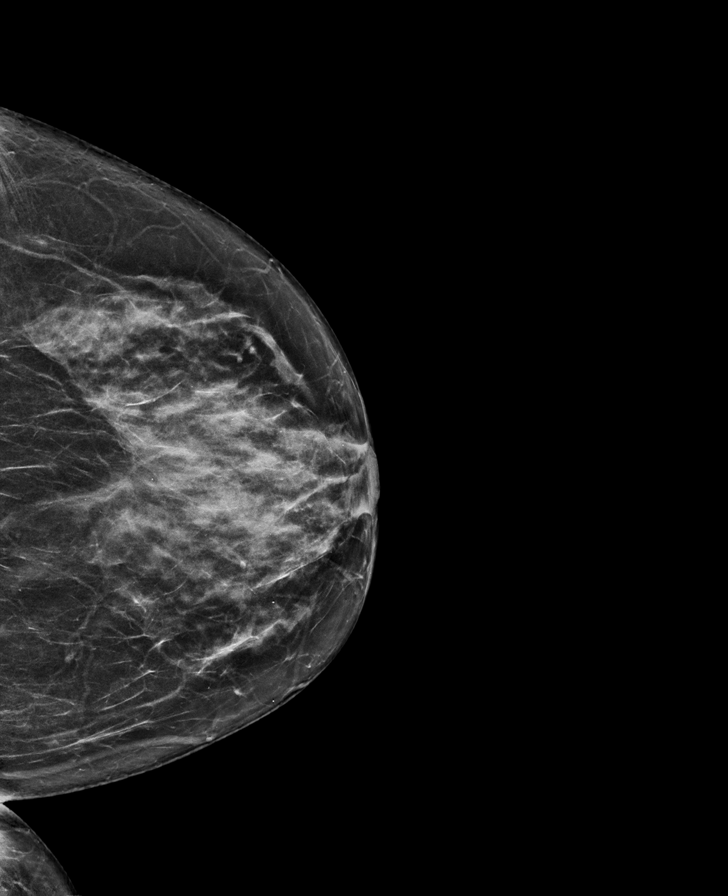

[R MLO synth-2D]
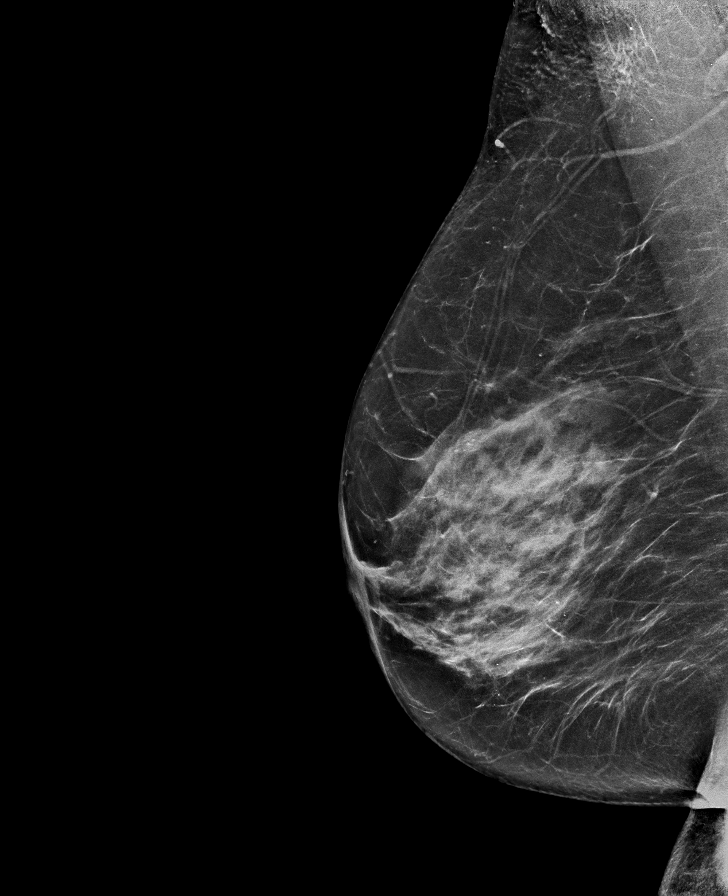

[L MLO synth-2D]
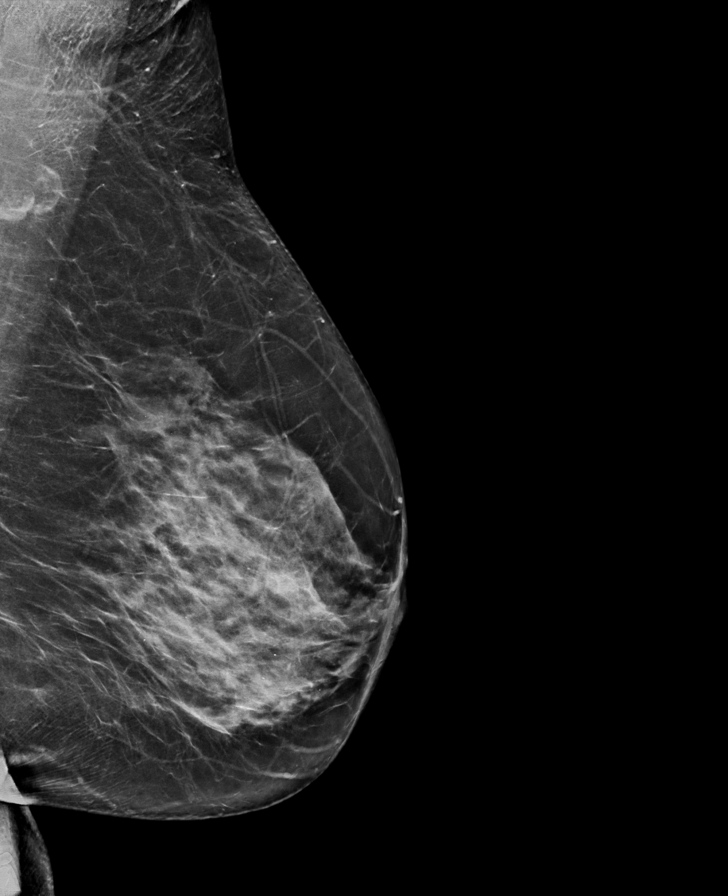

[R CC synth-2D]
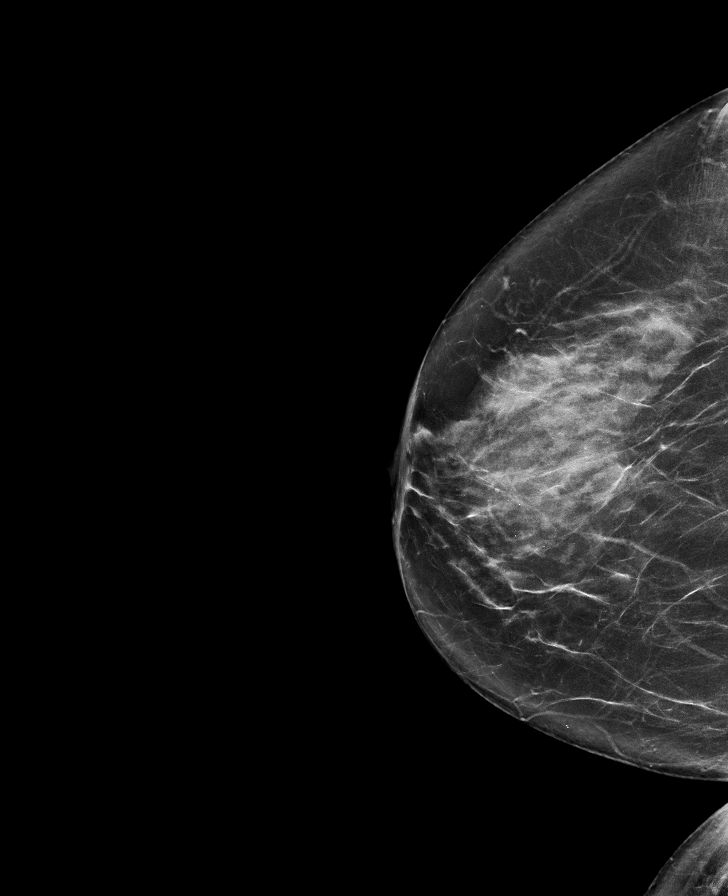

[R MLO tomo · tomo slice 35/69.0]
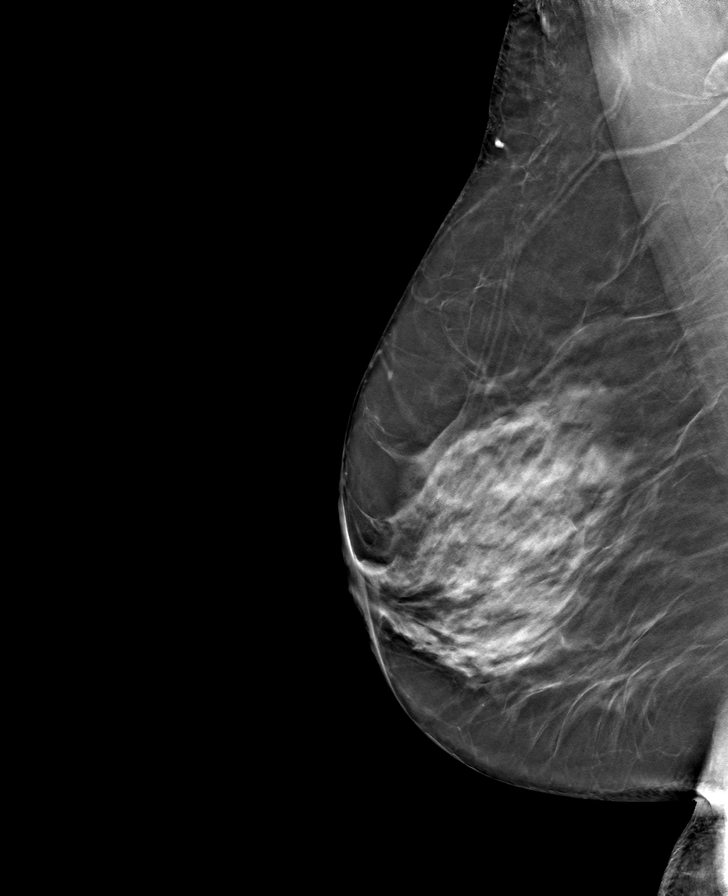

[L MLO tomo · tomo slice 37/72.0]
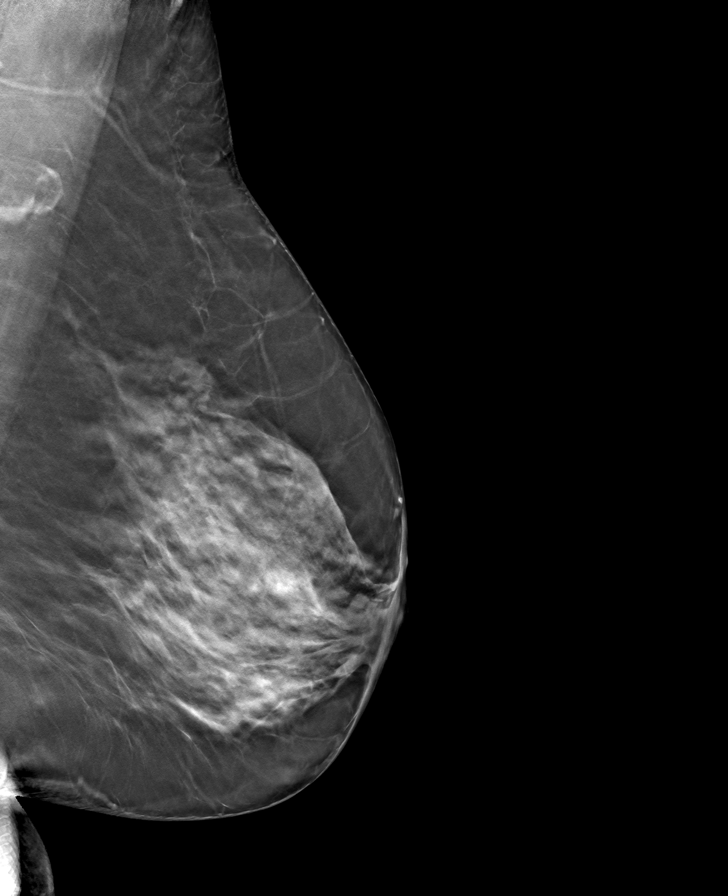

[R CC tomo · tomo slice 37/74.0]
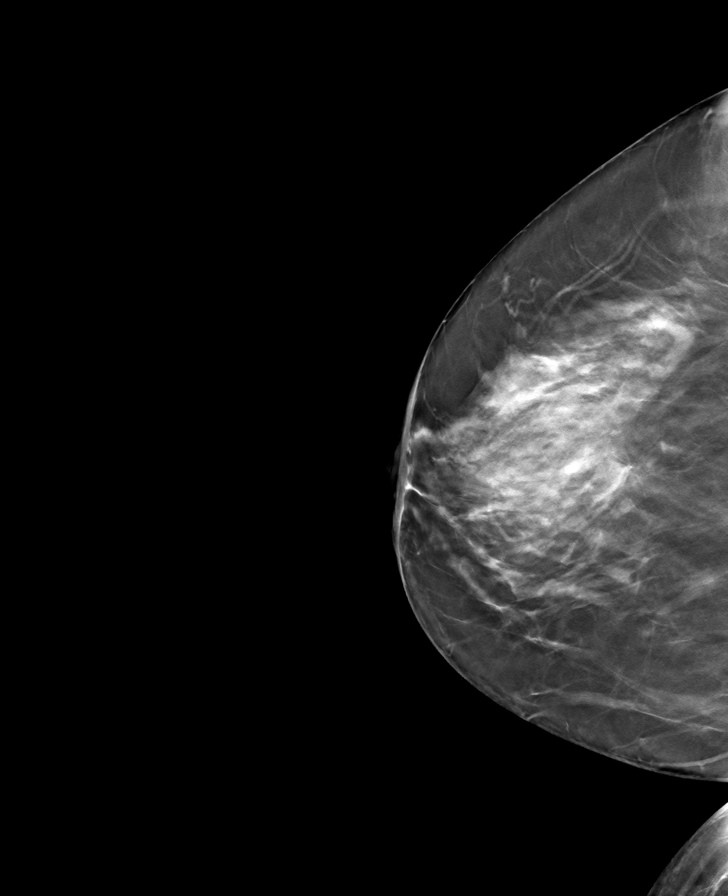

[L CC tomo · tomo slice 33/66.0]
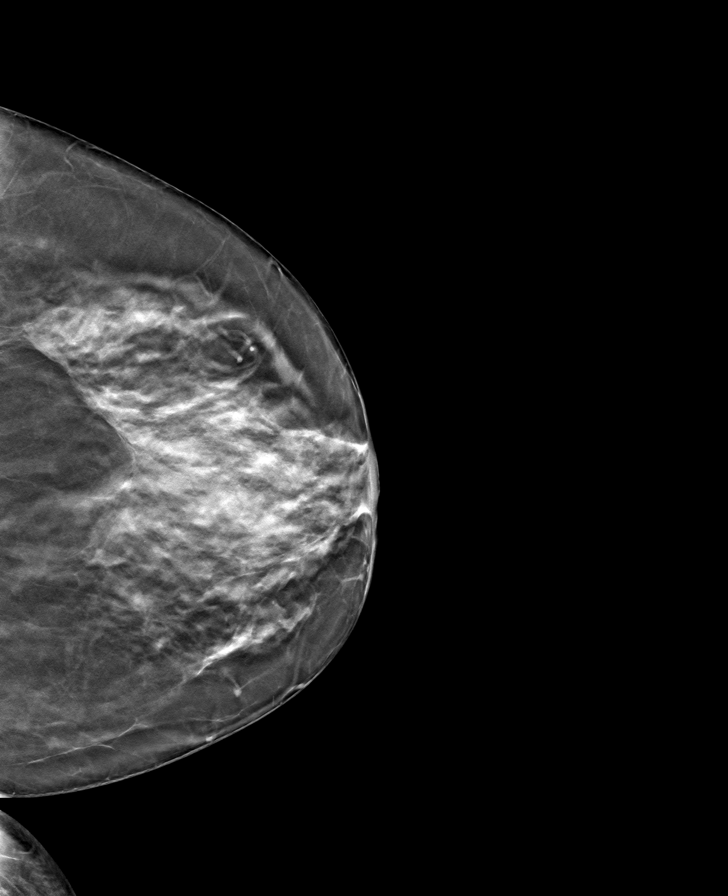

[8 of 24 positions shown; findings below may reference images not displayed]

ACR Breast Density Category c: The breast tissue is heterogeneously
dense, which may obscure small masses.
FINDINGS: There are no findings suspicious for malignancy. Images were
processed with CAD.
IMPRESSION: No mammographic evidence of malignancy. A result letter of this
screening mammogram will be mailed directly to the patient.

RECOMMENDATION:
Screening mammogram in one year. (Code:[id number]-U-LHB)

BI-RADS CATEGORY  1: Negative.

## 2018-12-08 ENCOUNTER — Other Ambulatory Visit: Payer: Self-pay | Admitting: Family Medicine

## 2018-12-08 DIAGNOSIS — F419 Anxiety disorder, unspecified: Secondary | ICD-10-CM

## 2018-12-08 NOTE — Telephone Encounter (Signed)
[facility] faxed refill request for the following medications: ? ? ?citalopram (CELEXA) 20 MG tablet  ? ?Please advise. ? ?

## 2018-12-11 ENCOUNTER — Ambulatory Visit (INDEPENDENT_AMBULATORY_CARE_PROVIDER_SITE_OTHER): Payer: Medicare Other | Admitting: Family Medicine

## 2018-12-11 ENCOUNTER — Other Ambulatory Visit: Payer: Self-pay

## 2018-12-11 ENCOUNTER — Encounter: Payer: Self-pay | Admitting: Family Medicine

## 2018-12-11 VITALS — BP 126/64 | HR 82 | Temp 98.1°F | Ht 67.0 in | Wt 149.0 lb

## 2018-12-11 DIAGNOSIS — I10 Essential (primary) hypertension: Secondary | ICD-10-CM

## 2018-12-11 DIAGNOSIS — E119 Type 2 diabetes mellitus without complications: Secondary | ICD-10-CM

## 2018-12-11 MED ORDER — CITALOPRAM HYDROBROMIDE 20 MG PO TABS: 10.0000 mg | ORAL_TABLET | Freq: Every day | ORAL | 11 refills | Status: DC

## 2018-12-11 NOTE — Progress Notes (Signed)
Patient: Anna Williams Female    DOB: 1946/01/08   73 y.o.   MRN: [id number] Visit Date: 12/11/2018  Today's Provider: Wilhemena Durie, MD   Chief Complaint  Patient presents with  . Hypertension    1 month fup   Subjective:     HPI   Follow up for hypertension  The patient was last seen for this 1 months ago. Changes made at last visit include amlodipine 2.5 mg until next visit so pt could have surgey she wished to have..  She reports excellent compliance with treatment. She feels that condition is Improved. She is not having side effects.   ------------------------------------------------------------------------------------ She is still having a lot of family stress sensor brother has what sounds like metastatic lung cancer.  Her ex-husband is also in poor health.  He is involved in his care.  She and her husband are getting ready to go to [place].  She is worried about travel due to the coronavirus.  Allergies  Allergen Reactions  . Codeine   . Lmx 4 [Lidocaine]      Current Outpatient Medications:  .  amLODipine (NORVASC) 2.5 MG tablet, Take 1 tablet (2.5 mg total) by mouth daily., Disp: 30 tablet, Rfl: 3 .  Calcium Citrate-Vitamin D (CALCIUM + D PO), Take 1 tablet by mouth daily., Disp: , Rfl:  .  citalopram (CELEXA) 20 MG tablet, Take 0.5 tablets (10 mg total) by mouth daily., Disp: 30 tablet, Rfl: 11 .  glucose blood (ONE TOUCH ULTRA TEST) test strip, CHECK SUGAR ONCE DAILY, Disp: 100 each, Rfl: 11 .  JARDIANCE 25 MG TABS tablet, TAKE 1 TABLET BY MOUTH DAILY, Disp: 30 tablet, Rfl: 11 .  meloxicam (MOBIC) 15 MG tablet, Take 1 tablet (15 mg total) by mouth daily., Disp: 30 tablet, Rfl: 3 .  metFORMIN (GLUCOPHAGE-XR) 500 MG 24 hr tablet, Take 1 tablet (500 mg total) by mouth daily with breakfast., Disp: 90 tablet, Rfl: 3 .  Multiple Vitamins-Minerals (PRESERVISION/LUTEIN PO), Take by mouth daily. , Disp: , Rfl:  .  omeprazole (PRILOSEC) 20 MG  capsule, TAKE 1 CAPSULE BY MOUTH EVERY DAY, Disp: 90 capsule, Rfl: 4 .  ONETOUCH DELICA LANCETS FINE MISC, Check sugar once daily, Disp: 50 each, Rfl: 12  Review of Systems  Constitutional: Negative.   HENT: Negative.   Eyes: Negative.   Respiratory: Negative.   Cardiovascular: Negative.   Gastrointestinal: Negative.   Endocrine: Negative.   Genitourinary: Negative.   Musculoskeletal: Negative.   Skin: Negative.   Allergic/Immunologic: Negative.   Neurological: Negative.   Hematological: Negative.   Psychiatric/Behavioral: Negative.     Social History   Tobacco Use  . Smoking status: Never Smoker  . Smokeless tobacco: Never Used  Substance Use Topics  . Alcohol use: No      Objective:   BP 126/64 (BP Location: Right Arm, Patient Position: Sitting, Cuff Size: Normal)   Pulse 82   Temp 98.1 F (36.7 C) (Oral)   Ht 5\' 7"  (1.702 m)   Wt 149 lb (67.6 kg)   SpO2 96%   BMI 23.34 kg/m  Vitals:   12/11/18 1452  BP: 126/64  Pulse: 82  Temp: 98.1 F (36.7 C)  TempSrc: Oral  SpO2: 96%  Weight: 149 lb (67.6 kg)  Height: 5\' 7"  (1.702 m)     Physical Exam Vitals signs reviewed.  Constitutional:      Appearance: She is well-developed.  HENT:     Right  Ear: External ear normal.     Left Ear: External ear normal.     Mouth/Throat:     Pharynx: Oropharynx is clear.  Eyes:     Conjunctiva/sclera: Conjunctivae normal.     Pupils: Pupils are equal, round, and reactive to light.  Neck:     Musculoskeletal: Normal range of motion and neck supple.  Cardiovascular:     Rate and Rhythm: Normal rate and regular rhythm.     Heart sounds: Normal heart sounds.  Pulmonary:     Effort: Pulmonary effort is normal.     Breath sounds: Normal breath sounds.  Abdominal:     Palpations: Abdomen is soft.  Musculoskeletal: Normal range of motion.  Skin:    General: Skin is warm and dry.  Neurological:     General: No focal deficit present.     Mental Status: She is alert and  oriented to person, place, and time.     Deep Tendon Reflexes: Reflexes are normal and symmetric.  Psychiatric:        Mood and Affect: Mood normal.        Behavior: Behavior normal.        Thought Content: Thought content normal.        Judgment: Judgment normal.         Assessment & Plan    1. Essential hypertension Improved control on low-dose amlodipine.  She is cleared for surgery.  2. Diabetes mellitus type 2 without retinopathy ([place]) She has follow-up in about a month for this.    I have done the exam and reviewed the above chart and it is accurate to the best of my knowledge. Development worker, community has been used in this note in any air is in the dictation or transcription are unintentional.  Wilhemena Durie, MD  [place]

## 2018-12-14 ENCOUNTER — Telehealth: Payer: Self-pay

## 2018-12-14 NOTE — Telephone Encounter (Signed)
Called pt to schedule her AWV for this year and she stated she was getting ready to go out of town for a couple weeks and would like a CB around the last week of March to schedule AWV. Note made. -MM

## 2018-12-27 ENCOUNTER — Ambulatory Visit: Payer: Self-pay | Admitting: Family Medicine

## 2019-01-02 ENCOUNTER — Ambulatory Visit: Payer: Self-pay | Admitting: Family Medicine

## 2019-01-04 NOTE — Telephone Encounter (Signed)
LM advising pt we are offering AWV to be completed over the phone for the next couple week. Requested a CB to schedule and verify # to call. Direct # left to CB.  -MM

## 2019-01-30 NOTE — Telephone Encounter (Signed)
Scheduled telephonic AWV for tomorrow, 01/31/19 @ 2:00 PM.  -MM

## 2019-01-31 ENCOUNTER — Other Ambulatory Visit: Payer: Self-pay

## 2019-01-31 NOTE — Progress Notes (Signed)
This encounter was created in error - please disregard.

## 2019-02-01 ENCOUNTER — Other Ambulatory Visit: Payer: Self-pay | Admitting: Family Medicine

## 2019-02-01 DIAGNOSIS — I10 Essential (primary) hypertension: Secondary | ICD-10-CM

## 2019-04-01 ENCOUNTER — Other Ambulatory Visit: Payer: Self-pay | Admitting: Family Medicine

## 2019-04-01 DIAGNOSIS — F419 Anxiety disorder, unspecified: Secondary | ICD-10-CM

## 2019-04-25 ENCOUNTER — Ambulatory Visit (INDEPENDENT_AMBULATORY_CARE_PROVIDER_SITE_OTHER): Payer: Medicare Other | Admitting: Family Medicine

## 2019-04-25 ENCOUNTER — Other Ambulatory Visit: Payer: Self-pay

## 2019-04-25 VITALS — BP 108/68 | HR 60 | Temp 98.1°F | Resp 16 | Wt 145.0 lb

## 2019-04-25 DIAGNOSIS — E119 Type 2 diabetes mellitus without complications: Secondary | ICD-10-CM

## 2019-04-25 DIAGNOSIS — I10 Essential (primary) hypertension: Secondary | ICD-10-CM

## 2019-04-25 LAB — POCT GLYCOSYLATED HEMOGLOBIN (HGB A1C): Hemoglobin A1C: 7.9 % — AB (ref 4.0–5.6)

## 2019-04-25 MED ORDER — PIOGLITAZONE HCL 15 MG PO TABS: 15.0000 mg | ORAL_TABLET | Freq: Every day | ORAL | 1 refills | Status: DC

## 2019-04-25 NOTE — Progress Notes (Signed)
Patient: Anna Williams Female    DOB: 06/25/1946   73 y.o.   MRN: [id number] Visit Date: 04/25/2019  Today's Provider: Wilhemena Durie, MD   Chief Complaint  Patient presents with  . Diabetes  . Hypertension   Subjective:   HPI    Diabetes Mellitus Type II, Follow-up:   Lab Results  Component Value Date   HGBA1C 7.9 (A) 04/25/2019   HGBA1C 7.5 (A) 08/28/2018   HGBA1C 7.9 (A) 04/24/2018    Last seen for diabetes 6 months ago.  Management since then includes no changes. She reports good compliance with treatment. She is not having side effects.  Current symptoms include none and have been stable. Home blood sugar records: trend: stable  Episodes of hypoglycemia? no   Current insulin regiment: Is not on insulin Most Recent Eye Exam: up to date Weight trend: stable Prior visit with dietician: No Current exercise: no regular exercise Current diet habits: well balanced  Pertinent Labs:    Component Value Date/Time   CHOL 170 12/20/2017 1132   TRIG 141 12/20/2017 1132   HDL 51 12/20/2017 1132   LDLCALC 91 12/20/2017 1132   CREATININE 0.86 12/20/2017 1132    Wt Readings from Last 3 Encounters:  04/25/19 145 lb (65.8 kg)  12/11/18 149 lb (67.6 kg)  11/02/18 150 lb 3.2 oz (68.1 kg)    Hypertension, follow-up:  BP Readings from Last 3 Encounters:  04/25/19 108/68  12/11/18 126/64  11/02/18 132/66    She was last seen for hypertension 6 months ago.  BP at that visit was 134/78. Management since that visit includes no changes. She reports good compliance with treatment. She is not having side effects.    Allergies  Allergen Reactions  . Codeine   . Lmx 4 [Lidocaine]      Current Outpatient Medications:  .  amLODipine (NORVASC) 2.5 MG tablet, TAKE 1 TABLET BY MOUTH EVERY DAY, Disp: 90 tablet, Rfl: 1 .  Calcium Citrate-Vitamin D (CALCIUM + D PO), Take 1 tablet by mouth daily., Disp: , Rfl:  .  citalopram (CELEXA) 20 MG tablet, TAKE 1/2  TABLET BY MOUTH DAILY, Disp: 45 tablet, Rfl: 8 .  glucose blood (ONE TOUCH ULTRA TEST) test strip, CHECK SUGAR ONCE DAILY, Disp: 100 each, Rfl: 11 .  JARDIANCE 25 MG TABS tablet, TAKE 1 TABLET BY MOUTH DAILY, Disp: 30 tablet, Rfl: 11 .  meloxicam (MOBIC) 15 MG tablet, Take 1 tablet (15 mg total) by mouth daily., Disp: 30 tablet, Rfl: 3 .  metFORMIN (GLUCOPHAGE-XR) 500 MG 24 hr tablet, Take 1 tablet (500 mg total) by mouth daily with breakfast., Disp: 90 tablet, Rfl: 3 .  Multiple Vitamins-Minerals (PRESERVISION/LUTEIN PO), Take by mouth daily. , Disp: , Rfl:  .  omeprazole (PRILOSEC) 20 MG capsule, TAKE 1 CAPSULE BY MOUTH EVERY DAY, Disp: 90 capsule, Rfl: 4 .  ONETOUCH DELICA LANCETS FINE MISC, Check sugar once daily, Disp: 50 each, Rfl: 12  Review of Systems  Constitutional: Negative.   HENT: Negative.   Eyes: Negative.   Respiratory: Negative.   Cardiovascular: Negative.   Gastrointestinal: Negative.   Endocrine: Negative.   Genitourinary: Negative.   Musculoskeletal: Negative.   Skin: Negative.   Allergic/Immunologic: Negative.   Neurological: Negative.   Hematological: Negative.   Psychiatric/Behavioral: Negative.     Social History   Tobacco Use  . Smoking status: Never Smoker  . Smokeless tobacco: Never Used  Substance Use Topics  . Alcohol  use: No      Objective:   BP 108/68   Pulse 60   Temp 98.1 F (36.7 C)   Resp 16   Wt 145 lb (65.8 kg)   SpO2 99%   BMI 22.71 kg/m  Vitals:   04/25/19 1526  BP: 108/68  Pulse: 60  Resp: 16  Temp: 98.1 F (36.7 C)  SpO2: 99%  Weight: 145 lb (65.8 kg)     Physical Exam Vitals signs reviewed.  Constitutional:      Appearance: She is well-developed.  HENT:     Head: Normocephalic and atraumatic.     Right Ear: External ear normal.     Left Ear: External ear normal.     Nose: Nose normal.  Eyes:     General: No scleral icterus.    Conjunctiva/sclera: Conjunctivae normal.  Neck:     Thyroid: No thyromegaly.   Cardiovascular:     Rate and Rhythm: Normal rate and regular rhythm.     Heart sounds: Normal heart sounds.  Pulmonary:     Effort: Pulmonary effort is normal.     Breath sounds: Normal breath sounds.  Abdominal:     Palpations: Abdomen is soft.  Musculoskeletal:     Right lower leg: No edema.     Left lower leg: No edema.  Skin:    General: Skin is warm and dry.  Neurological:     General: No focal deficit present.     Mental Status: She is alert and oriented to person, place, and time.  Psychiatric:        Mood and Affect: Mood normal.        Behavior: Behavior normal.        Thought Content: Thought content normal.        Judgment: Judgment normal.      Results for orders placed or performed in visit on 04/25/19  POCT glycosylated hemoglobin (Hb A1C)  Result Value Ref Range   Hemoglobin A1C 7.9 (A) 4.0 - 5.6 %   HbA1c POC (<> result, manual entry)     HbA1c, POC (prediabetic range)     HbA1c, POC (controlled diabetic range)         Assessment & Plan    1. Diabetes mellitus type 2 without retinopathy ([place]) Due to cost will stop Jardiance and try Actos 15 mg daily-RTC 3 weeks.  Consider statin as LDL 90. - POCT glycosylated hemoglobin (Hb A1C)--7.9 today - pioglitazone (ACTOS) 15 MG tablet; Take 1 tablet (15 mg total) by mouth daily.  Dispense: 90 tablet; Refill: 1  2. Essential hypertension Stop Amlodipine.      Cranford Mon, MD  [facility]

## 2019-04-25 NOTE — Patient Instructions (Signed)
Stop amlodipine.   [employer].

## 2019-05-29 ENCOUNTER — Other Ambulatory Visit: Payer: Self-pay | Admitting: Family Medicine

## 2019-05-29 DIAGNOSIS — E119 Type 2 diabetes mellitus without complications: Secondary | ICD-10-CM

## 2019-06-05 ENCOUNTER — Encounter: Payer: Medicare Other | Admitting: Obstetrics and Gynecology

## 2019-06-05 ENCOUNTER — Encounter: Payer: Self-pay | Admitting: Obstetrics and Gynecology

## 2019-06-05 ENCOUNTER — Ambulatory Visit (INDEPENDENT_AMBULATORY_CARE_PROVIDER_SITE_OTHER): Payer: Medicare Other | Admitting: Obstetrics and Gynecology

## 2019-06-05 ENCOUNTER — Other Ambulatory Visit: Payer: Self-pay

## 2019-06-05 VITALS — BP 123/70 | HR 72 | Ht 67.0 in | Wt 152.5 lb

## 2019-06-05 DIAGNOSIS — R399 Unspecified symptoms and signs involving the genitourinary system: Secondary | ICD-10-CM | POA: Diagnosis not present

## 2019-06-05 DIAGNOSIS — Z1231 Encounter for screening mammogram for malignant neoplasm of breast: Secondary | ICD-10-CM

## 2019-06-05 DIAGNOSIS — Z809 Family history of malignant neoplasm, unspecified: Secondary | ICD-10-CM

## 2019-06-05 DIAGNOSIS — I10 Essential (primary) hypertension: Secondary | ICD-10-CM

## 2019-06-05 DIAGNOSIS — H6192 Disorder of left external ear, unspecified: Secondary | ICD-10-CM

## 2019-06-05 DIAGNOSIS — N952 Postmenopausal atrophic vaginitis: Secondary | ICD-10-CM

## 2019-06-05 DIAGNOSIS — Z01419 Encounter for gynecological examination (general) (routine) without abnormal findings: Secondary | ICD-10-CM | POA: Diagnosis not present

## 2019-06-05 DIAGNOSIS — E119 Type 2 diabetes mellitus without complications: Secondary | ICD-10-CM

## 2019-06-05 DIAGNOSIS — Z780 Asymptomatic menopausal state: Secondary | ICD-10-CM

## 2019-06-05 DIAGNOSIS — N898 Other specified noninflammatory disorders of vagina: Secondary | ICD-10-CM

## 2019-06-05 DIAGNOSIS — Z1211 Encounter for screening for malignant neoplasm of colon: Secondary | ICD-10-CM | POA: Diagnosis not present

## 2019-06-05 LAB — POCT URINALYSIS DIPSTICK
Bilirubin, UA: NEGATIVE
Blood, UA: NEGATIVE
Glucose, UA: POSITIVE — AB
Ketones, UA: NEGATIVE
Leukocytes, UA: NEGATIVE
Nitrite, UA: NEGATIVE
Protein, UA: NEGATIVE
Spec Grav, UA: 1.01 (ref 1.010–1.025)
Urobilinogen, UA: 0.2 E.U./dL
pH, UA: 6 (ref 5.0–8.0)

## 2019-06-05 NOTE — Progress Notes (Signed)
GYNECOLOGY ANNUAL PHYSICAL EXAM PROGRESS NOTE  Subjective:    Anna Williams is a 73 y.o. G33P2002 married female who presents for an annual exam. She is transitioning care from Dr. Enzo Bi as he has retired.  The patient is not currently sexually active. Anna Williams reports that she has not had any postmenopausal bleeding, and denies the use of hormone replacement therapy. The patient wears seatbelts: yes. The patient participates in regular exercise: no. Has the patient ever been transfused or tattooed?: no. The patient reports that there is not domestic violence in her life.   The patient has the following complaints today:  1. Reports an area on her left ear that has begun crusting near where she wars her earring for several weeks. Has attempted to use peroxide and neosporin once or twice.  Notes that her pharmacist told her to use Vaseline, which she has been applying daily. Sometimes attempts to go without wearing earrings.  2. Vaginal irritation since starting her new diabetes medicine.  Notes that she was recently changed from [place] due to cost, now on Metformin and Pioglitazone.  Denies vaginal discharge, but does also note some discomfort with urination and pressure.  3. Patient notes that she has been feeling a little more tired and down lately.  Is currently taking care of her husband who has prostate cancer.  Also cares for her ex-husband who has had 3 strokes.  In addition, is helping her brother who has newly diagnosed brain metastases from recurring lung cancer (he has also had mets to bladder and throat in the past).  Notes that she usually has the support of her church, however has been more difficult during COVID-19 pandemic.    Gynecologic History  Menarche age: patient cannot recall, possibly 7 or 23.  No LMP recorded. Patient is postmenopausal. Contraception: post menopausal status History of STI's: Denies Last Pap: 04/29/2015. Results were: normal.  Denies h/o  abnormal pap smears. Last mammogram: 07/28/2018. Results were: normal Last colonoscopy: 06/25/2014.  Results were: normal. Is currently doing yearly FOBT. Last Dexa Scan: 07/2018.  Results were: normal   OB History  Gravida Para Term Preterm AB Living  2 2 2  0 0 2  SAB TAB Ectopic Multiple Live Births  0 0 0 0 2    # Outcome Date GA Lbr Len/2nd Weight Sex Delivery Anes PTL Lv  2 Term 1978    M Vag-Spont   LIV  1 Term 1964    M Vag-Spont   LIV    Past Medical History:  Diagnosis Date  . Anxiety and depression   . ASCUS with positive high risk HPV 09/2013  . Diabetes mellitus without complication ([place])    type 2  . Elevated blood sugar   . Family history of breast cancer in mother   . Fibroid uterus   . History of pulmonary embolus (PE)   . Hypertension   . Menopause   . Mild dysplasia of cervix 09/2013   repeat pap done -ecc neg, cerival bx cin 1  . Retinal detachment     Past Surgical History:  Procedure Laterality Date  . anterior neck fusion    . BREAST CYST ASPIRATION Left yrs ago  . CHOLECYSTECTOMY  2010  . DILATION AND CURETTAGE OF UTERUS    . HYSTEROSCOPY    . mole removed  2014    Family History  Problem Relation Age of Onset  . Breast cancer Mother 43       reccurence  age 36  . Heart disease Father   . Throat cancer Brother   . Heart disease Brother   . Lung cancer Brother        with mets to brain  . Breast cancer Maternal Aunt   . Colon cancer Neg Hx   . Ovarian cancer Neg Hx     Social History   Socioeconomic History  . Marital status: Married    Spouse name: Not on file  . Number of children: 2  . Years of education: Not on file  . Highest education level: Associate degree: occupational, Hotel manager, or vocational program  Occupational History  . Occupation: retired  Scientific laboratory technician  . Financial resource strain: Not hard at all  . Food insecurity    Worry: Never true    Inability: Never true  . Transportation needs    Medical: No     Non-medical: No  Tobacco Use  . Smoking status: Never Smoker  . Smokeless tobacco: Never Used  Substance and Sexual Activity  . Alcohol use: No  . Drug use: No  . Sexual activity: Not Currently    Birth control/protection: Post-menopausal  Lifestyle  . Physical activity    Days per week: 2 days    Minutes per session: 30 min  . Stress: To some extent  Relationships  . Social Herbalist on phone: Not on file    Gets together: Not on file    Attends religious service: Not on file    Active member of club or organization: Not on file    Attends meetings of clubs or organizations: Not on file    Relationship status: Not on file  . Intimate partner violence    Fear of current or ex partner: Not on file    Emotionally abused: Not on file    Physically abused: Not on file    Forced sexual activity: Not on file  Other Topics Concern  . Not on file  Social History Narrative  . Not on file    Current Outpatient Medications on File Prior to Visit  Medication Sig Dispense Refill  . Calcium Citrate-Vitamin D (CALCIUM + D PO) Take 1 tablet by mouth daily.    . citalopram (CELEXA) 20 MG tablet TAKE 1/2 TABLET BY MOUTH DAILY 45 tablet 8  . glucose blood (ONE TOUCH ULTRA TEST) test strip CHECK SUGAR ONCE DAILY 100 each 11  . meloxicam (MOBIC) 15 MG tablet Take 1 tablet (15 mg total) by mouth daily. 30 tablet 3  . metFORMIN (GLUCOPHAGE-XR) 500 MG 24 hr tablet TAKE 1 TABLET BY MOUTH EVERY DAY WITH BREAKFAST 90 tablet 3  . Multiple Vitamins-Minerals (PRESERVISION/LUTEIN PO) Take by mouth daily.     Marland Kitchen omeprazole (PRILOSEC) 20 MG capsule TAKE 1 CAPSULE BY MOUTH EVERY DAY 90 capsule 4  . ONETOUCH DELICA LANCETS FINE MISC Check sugar once daily 50 each 12  . pioglitazone (ACTOS) 15 MG tablet Take 1 tablet (15 mg total) by mouth daily. 90 tablet 1  . amLODipine (NORVASC) 2.5 MG tablet TAKE 1 TABLET BY MOUTH EVERY DAY (Patient not taking: Reported on 06/05/2019) 90 tablet 1  . JARDIANCE  25 MG TABS tablet TAKE 1 TABLET BY MOUTH DAILY (Patient not taking: Reported on 06/05/2019) 30 tablet 11   No current facility-administered medications on file prior to visit.     Allergies  Allergen Reactions  . Codeine   . Lmx 4 [Lidocaine]       Review of  Systems Constitutional: negative for chills, fatigue, fevers and sweats Eyes: negative for irritation, redness and visual disturbance Ears, nose, mouth, throat, and face: negative for hearing loss, nasal congestion, snoring and tinnitus Respiratory: negative for asthma, cough, sputum Cardiovascular: negative for chest pain, dyspnea, exertional chest pressure/discomfort, irregular heart beat, palpitations and syncope Gastrointestinal: negative for abdominal pain, change in bowel habits, nausea and vomiting Genitourinary: negative for abnormal menstrual periods, genital lesions, sexual problems and vaginal discharge, dysuria and urinary incontinence. Positive for vaginal irritation and urinary pressure.  Integument/breast: negative for breast lump, breast tenderness and nipple discharge Hematologic/lymphatic: negative for bleeding and easy bruising Musculoskeletal:negative for back pain and muscle weakness Neurological: negative for dizziness, headaches, vertigo and weakness Endocrine: negative for diabetic symptoms including polydipsia, polyuria and skin dryness Allergic/Immunologic: negative for hay fever and urticaria        Objective:  Blood pressure 123/70, pulse 72, height 5\' 7"  (1.702 m), weight 152 lb 8 oz (69.2 kg). Body mass index is 23.88 kg/m.  General Appearance:    Alert, cooperative, no distress, appears stated age  Head:    Normocephalic, without obvious abnormality, atraumatic  Eyes:    PERRL, conjunctiva/corneas clear, EOM's intact, both eyes  Ears:    Normal external ear canals, both ears  Nose:   Nares normal, septum midline, mucosa normal, no drainage or sinus tenderness  Throat:   Lips, mucosa, and tongue  normal; teeth and gums normal  Neck:   Supple, symmetrical, trachea midline, no adenopathy; thyroid: no enlargement/tenderness/nodules; no carotid bruit or JVD  Back:     Symmetric, no curvature, ROM normal, no CVA tenderness  Lungs:     Clear to auscultation bilaterally, respirations unlabored  Chest Wall:    No tenderness or deformity   Heart:    Regular rate and rhythm, S1 and S2 normal, no murmur, rub or gallop  Breast Exam:    No tenderness, masses, or nipple abnormality  Abdomen:     Soft, non-tender, bowel sounds active all four quadrants, no masses, no organomegaly.    Genitalia:    Pelvic:external genitalia normal, vagina without lesions, discharge, or tenderness, mildly atrophic. Rectovaginal septum  normal. Cervix normal in appearance, no cervical motion tenderness, no adnexal masses or tenderness.  Uterus normal size, shape, mobile, regular contours, nontender.  Rectal:    Normal external sphincter.  No hemorrhoids appreciated. Internal exam not done.   Extremities:   Extremities normal, atraumatic, no cyanosis or edema  Pulses:   2+ and symmetric all extremities  Skin:   Skin color, texture, turgor normal, no rashes or lesions  Lymph nodes:   Cervical, supraclavicular, and axillary nodes normal  Neurologic:   CNII-XII intact, normal strength, sensation and reflexes throughout   .  Labs:  Lab Results  Component Value Date   WBC 5.3 12/20/2017   HGB 15.9 12/20/2017   HCT 46.8 (H) 12/20/2017   MCV 95 12/20/2017   PLT 183 12/20/2017    Lab Results  Component Value Date   CREATININE 0.86 12/20/2017   BUN 11 12/20/2017   NA 143 12/20/2017   K 4.1 12/20/2017   CL 102 12/20/2017   CO2 24 12/20/2017    Lab Results  Component Value Date   ALT 47 (H) 12/20/2017   AST 38 12/20/2017   ALKPHOS 87 12/20/2017   BILITOT 0.8 12/20/2017    Lab Results  Component Value Date   TSH 1.970 12/20/2017     Results for orders placed or performed in visit  on 06/05/19  POCT  urinalysis dipstick  Result Value Ref Range   Color, UA yellow    Clarity, UA clear    Glucose, UA Positive (A) Negative   Bilirubin, UA neg    Ketones, UA neg    Spec Grav, UA 1.010 1.010 - 1.025   Blood, UA neg    pH, UA 6.0 5.0 - 8.0   Protein, UA Negative Negative   Urobilinogen, UA 0.2 0.2 or 1.0 E.U./dL   Nitrite, UA neg    Leukocytes, UA Negative Negative   Appearance yellow    Odor      Assessment:   1. Encounter for well woman exam with routine gynecological exam   2. UTI symptoms   3. Colon cancer screening   4. Family history of cancer   5. Vaginal irritation   6. Menopause   7. Encounter for screening mammogram for breast cancer   8. Vaginal atrophy   9. Essential hypertension   10. Diabetes mellitus type 2 without retinopathy ([place])   11. Lesion of left earlobe     Plan:    Labs: performed by PCP.   Breast self exam technique reviewed and patient encouraged to perform self-exam monthly. Contraception: post menopausal status. Discussed healthy lifestyle modifications. Continued to encourage self-care as she is providing assistance and care to others.  Mammogram up to date. Due in October 2020. Colon cancer screening: Up to date colonoscopy. Patient also doing FOBT testing yearly. Stool card given.  Pap smears no longer needed.   Bladder symptoms present with no evidence of UTI on UA. Encouraged increasing hydration, given samples of Uribel as needed.  Vaginal irritation, no evidence of vaginal infection or discharge today. May be due to medications, or mild vaginal atrophy. Patient declines any hormone use due to history of PE, as well as family history of breast cancer. Discussed options of coconut oil or OTC vaginal moisturizers (given samples).  Menopausal, otherwise stable, no vasomotor symptoms.  Family history of cancer, discussed that she may qualify for hereditary cancer screening.  Discussed risks and benefits. Given handout to review, can have testing  done if desired.  HTN and DM manaed by PCP.  Ear lobe crusting, no signs of ulceration. Given samples of Bacitracin ointment, strongly encouraged for patient to refrain from earring use for at least 1 full week. To f/u if no improvement.  Follow up in 1 year for annual exam.      Rubie Maid, MD  [facility]

## 2019-06-05 NOTE — Patient Instructions (Signed)
Health Maintenance for Postmenopausal Women Menopause is a normal process in which your ability to get pregnant comes to an end. This process happens slowly over many months or years, usually between the ages of 48 and 55. Menopause is complete when you have missed your menstrual periods for 12 months. It is important to talk with your health care provider about some of the most common conditions that affect women after menopause (postmenopausal women). These include heart disease, cancer, and bone loss (osteoporosis). Adopting a healthy lifestyle and getting preventive care can help to promote your health and wellness. The actions you take can also lower your chances of developing some of these common conditions. What should I know about menopause? During menopause, you may get a number of symptoms, such as:  Hot flashes. These can be moderate or severe.  Night sweats.  Decrease in sex drive.  Mood swings.  Headaches.  Tiredness.  Irritability.  Memory problems.  Insomnia. Choosing to treat or not to treat these symptoms is a decision that you make with your health care provider. Do I need hormone replacement therapy?  Hormone replacement therapy is effective in treating symptoms that are caused by menopause, such as hot flashes and night sweats.  Hormone replacement carries certain risks, especially as you become older. If you are thinking about using estrogen or estrogen with progestin, discuss the benefits and risks with your health care provider. What is my risk for heart disease and stroke? The risk of heart disease, heart attack, and stroke increases as you age. One of the causes may be a change in the body's hormones during menopause. This can affect how your body uses dietary fats, triglycerides, and cholesterol. Heart attack and stroke are medical emergencies. There are many things that you can do to help prevent heart disease and stroke. Watch your blood pressure  High  blood pressure causes heart disease and increases the risk of stroke. This is more likely to develop in people who have high blood pressure readings, are of African descent, or are overweight.  Have your blood pressure checked: ? Every 3-5 years if you are 18-39 years of age. ? Every year if you are 40 years old or older. Eat a healthy diet   Eat a diet that includes plenty of vegetables, fruits, low-fat dairy products, and lean protein.  Do not eat a lot of foods that are high in solid fats, added sugars, or sodium. Get regular exercise Get regular exercise. This is one of the most important things you can do for your health. Most adults should:  Try to exercise for at least 150 minutes each week. The exercise should increase your heart rate and make you sweat (moderate-intensity exercise).  Try to do strengthening exercises at least twice each week. Do these in addition to the moderate-intensity exercise.  Spend less time sitting. Even light physical activity can be beneficial. Other tips  Work with your health care provider to achieve or maintain a healthy weight.  Do not use any products that contain nicotine or tobacco, such as cigarettes, e-cigarettes, and chewing tobacco. If you need help quitting, ask your health care provider.  Know your numbers. Ask your health care provider to check your cholesterol and your blood sugar (glucose). Continue to have your blood tested as directed by your health care provider. Do I need screening for cancer? Depending on your health history and family history, you may need to have cancer screening at different stages of your life. This   may include screening for:  Breast cancer.  Cervical cancer.  Lung cancer.  Colorectal cancer. What is my risk for osteoporosis? After menopause, you may be at increased risk for osteoporosis. Osteoporosis is a condition in which bone destruction happens more quickly than new bone creation. To help prevent  osteoporosis or the bone fractures that can happen because of osteoporosis, you may take the following actions:  If you are 56-57 years old, get at least 1,000 mg of calcium and at least 600 mg of vitamin D per day.  If you are older than age 84 but younger than age 59, get at least 1,200 mg of calcium and at least 600 mg of vitamin D per day.  If you are older than age 35, get at least 1,200 mg of calcium and at least 800 mg of vitamin D per day. Smoking and drinking excessive alcohol increase the risk of osteoporosis. Eat foods that are rich in calcium and vitamin D, and do weight-bearing exercises several times each week as directed by your health care provider. How does menopause affect my mental health? Depression may occur at any age, but it is more common as you become older. Common symptoms of depression include:  Low or sad mood.  Changes in sleep patterns.  Changes in appetite or eating patterns.  Feeling an overall lack of motivation or enjoyment of activities that you previously enjoyed.  Frequent crying spells. Talk with your health care provider if you think that you are experiencing depression. General instructions See your health care provider for regular wellness exams and vaccines. This may include:  Scheduling regular health, dental, and eye exams.  Getting and maintaining your vaccines. These include: ? Influenza vaccine. Get this vaccine each year before the flu season begins. ? Pneumonia vaccine. ? Shingles vaccine. ? Tetanus, diphtheria, and pertussis (Tdap) booster vaccine. Your health care provider may also recommend other immunizations. Tell your health care provider if you have ever been abused or do not feel safe at home. Summary  Menopause is a normal process in which your ability to get pregnant comes to an end.  This condition causes hot flashes, night sweats, decreased interest in sex, mood swings, headaches, or lack of sleep.  Treatment for this  condition may include hormone replacement therapy.  Take actions to keep yourself healthy, including exercising regularly, eating a healthy diet, watching your weight, and checking your blood pressure and blood sugar levels.  Get screened for cancer and depression. Make sure that you are up to date with all your vaccines. This information is not intended to replace advice given to you by your health care provider. Make sure you discuss any questions you have with your health care provider. Document Released: 11/19/2005 Document Revised: 09/20/2018 Document Reviewed: 09/20/2018 Elsevier Patient Education  2020 [employer].     Atrophic Vaginitis Atrophic vaginitis is a condition in which the tissues that line the vagina become dry and thin. This condition occurs in women who have stopped having their period. It is caused by a drop in a female hormone (estrogen). This hormone helps:  To keep the vagina moist.  To make a clear fluid. This clear fluid helps: ? To make the vagina ready for sex. ? To protect the vagina from infection. If the lining of the vagina is dry and thin, it may cause irritation, burning, or itchiness. It may also:  Make sex painful.  Make an exam of your vagina painful.  Cause bleeding.  Make you  lose interest in sex.  Cause a burning feeling when you pee (urinate).  Cause a brown or yellow fluid to come from your vagina. Some women do not have symptoms. Follow these instructions at home: Medicines  Take over-the-counter and prescription medicines only as told by your doctor.  Do not use herbs or other medicines unless your doctor says it is okay.  Use medicines for for dryness. These include: ? Oils to make the vagina soft. ? Creams. ? Moisturizers. General instructions  Do not douche.  Do not use products that can make your vagina dry. These include: ? Scented sprays. ? Scented tampons. ? Scented soaps.  Sex can help increase blood flow and  soften the tissue in the vagina. If it hurts to have sex: ? Tell your partner. ? Use products to make sex more comfortable. Use these only as told by your doctor. Contact a doctor if you:  Have discharge from the vagina that is different than usual.  Have a bad smell coming from your vagina.  Have new symptoms.  Do not get better.  Get worse. Summary  Atrophic vaginitis is a condition in which the lining of the vagina becomes dry and thin.  This condition affects women who have stopped having their periods.  Treatment may include using products that help make the vagina soft.  Call a doctor if do not get better with treatment. This information is not intended to replace advice given to you by your health care provider. Make sure you discuss any questions you have with your health care provider. Document Released: 03/15/2008 Document Revised: 10/10/2017 Document Reviewed: 10/10/2017 Elsevier Patient Education  2020 [employer].

## 2019-06-05 NOTE — Progress Notes (Signed)
Pt is present today for annual. Pt stated having urinating issues and urinalysis was completed.

## 2019-06-06 ENCOUNTER — Encounter: Payer: Self-pay | Admitting: Obstetrics and Gynecology

## 2019-06-11 ENCOUNTER — Other Ambulatory Visit: Payer: Self-pay | Admitting: Obstetrics and Gynecology

## 2019-06-11 DIAGNOSIS — Z1231 Encounter for screening mammogram for malignant neoplasm of breast: Secondary | ICD-10-CM

## 2019-07-05 NOTE — Progress Notes (Signed)
Subjective:   Anna Williams is a 73 y.o. female who presents for Medicare Annual (Subsequent) preventive examination.    This visit is being conducted through telemedicine due to the COVID-19 pandemic. This patient has given me verbal consent via doximity to conduct this visit, patient states they are participating from their home address. Some vital signs may be absent or patient reported.    Patient identification: identified by name, DOB, and current address  Review of Systems:  N/A  Cardiac Risk Factors include: advanced age (>67men, >72 women);diabetes mellitus;hypertension     Objective:     Vitals: There were no vitals taken for this visit.  There is no height or weight on file to calculate BMI. Unable to obtain vitals due to visit being conducted via telephonically.   Advanced Directives 07/09/2019 12/15/2017 09/13/2016 05/19/2015  Does Patient Have a Medical Advance Directive? No Yes No No  Type of Advance Directive - Living will - -  Would patient like information on creating a medical advance directive? Yes (MAU/Ambulatory/Procedural Areas - Information given) - - No - patient declined information    Tobacco Social History   Tobacco Use  Smoking Status Never Smoker  Smokeless Tobacco Never Used     Counseling given: Not Answered   Clinical Intake:  Pre-visit preparation completed: Yes  Pain : No/denies pain Pain Score: 0-No pain     Nutritional Risks: Nausea/ vomitting/ diarrhea(diarrhea often due to Metformin.) Diabetes: Yes  How often do you need to have someone help you when you read instructions, pamphlets, or other written materials from your doctor or pharmacy?: 1 - Never   Diabetes:  Is the patient diabetic?  Yes type 2 If diabetic, was a CBG obtained today?  No  Did the patient bring in their glucometer from home?  No  How often do you monitor your CBG's? Once every other day..   Financial Strains and Diabetes Management:  Are you having  any financial strains with the device, your supplies or your medication? No .  Does the patient want to be seen by Chronic Care Management for management of their diabetes?  No  Would the patient like to be referred to a Nutritionist or for Diabetic Management?  No   Diabetic Exams:  Diabetic Eye Exam: Completed 03/02/18. Overdue for diabetic eye exam. Pt has been advised about the importance in completing this exam.   Diabetic Foot Exam: Completed 05/25/17. Pt has been advised about the importance in completing this exam. Note made to follow up on this at next in office visit.     Interpreter Needed?: No  Information entered by :: [facility], LPN  Past Medical History:  Diagnosis Date  . Anxiety and depression   . ASCUS with positive high risk HPV 09/2013  . Diabetes mellitus without complication ([place])    type 2  . Elevated blood sugar   . Family history of breast cancer in mother   . Fibroid uterus   . History of pulmonary embolus (PE)   . Hypertension   . Menopause   . Mild dysplasia of cervix 09/2013   repeat pap done -ecc neg, cerival bx cin 1  . Retinal detachment    Past Surgical History:  Procedure Laterality Date  . anterior neck fusion    . BREAST CYST ASPIRATION Left yrs ago  . CHOLECYSTECTOMY  2010  . DILATION AND CURETTAGE OF UTERUS    . HYSTEROSCOPY    . mole removed  2014  Family History  Problem Relation Age of Onset  . Breast cancer Mother 58       reccurence age 58  . Heart disease Father   . Throat cancer Brother   . Heart disease Brother   . Lung cancer Brother        with mets to brain  . Breast cancer Maternal Aunt   . Colon cancer Neg Hx   . Ovarian cancer Neg Hx    Social History   Socioeconomic History  . Marital status: Married    Spouse name: Not on file  . Number of children: 2  . Years of education: Not on file  . Highest education level: Associate degree: occupational, Hotel manager, or vocational program  Occupational History  .  Occupation: retired  Scientific laboratory technician  . Financial resource strain: Not hard at all  . Food insecurity    Worry: Never true    Inability: Never true  . Transportation needs    Medical: No    Non-medical: No  Tobacco Use  . Smoking status: Never Smoker  . Smokeless tobacco: Never Used  Substance and Sexual Activity  . Alcohol use: No  . Drug use: No  . Sexual activity: Not Currently    Birth control/protection: Post-menopausal  Lifestyle  . Physical activity    Days per week: 0 days    Minutes per session: 0 min  . Stress: Rather much  Relationships  . Social Herbalist on phone: Patient refused    Gets together: Patient refused    Attends religious service: Patient refused    Active member of club or organization: Patient refused    Attends meetings of clubs or organizations: Patient refused    Relationship status: Patient refused  Other Topics Concern  . Not on file  Social History Narrative  . Not on file    Outpatient Encounter Medications as of 07/09/2019  Medication Sig  . Calcium Citrate-Vitamin D (CALCIUM + D PO) Take 1 tablet by mouth daily.  . citalopram (CELEXA) 20 MG tablet TAKE 1/2 TABLET BY MOUTH DAILY  . glucose blood (ONE TOUCH ULTRA TEST) test strip CHECK SUGAR ONCE DAILY  . meloxicam (MOBIC) 15 MG tablet Take 1 tablet (15 mg total) by mouth daily. (Patient taking differently: Take 15 mg by mouth daily. As needed)  . metFORMIN (GLUCOPHAGE-XR) 500 MG 24 hr tablet TAKE 1 TABLET BY MOUTH EVERY DAY WITH BREAKFAST  . Multiple Vitamins-Minerals (PRESERVISION/LUTEIN PO) Take by mouth daily.   Marland Kitchen omeprazole (PRILOSEC) 20 MG capsule TAKE 1 CAPSULE BY MOUTH EVERY DAY  . ONETOUCH DELICA LANCETS FINE MISC Check sugar once daily  . pioglitazone (ACTOS) 15 MG tablet Take 1 tablet (15 mg total) by mouth daily.  Marland Kitchen amLODipine (NORVASC) 2.5 MG tablet TAKE 1 TABLET BY MOUTH EVERY DAY (Patient not taking: Reported on 06/05/2019)  . JARDIANCE 25 MG TABS tablet TAKE 1  TABLET BY MOUTH DAILY (Patient not taking: Reported on 06/05/2019)   No facility-administered encounter medications on file as of 07/09/2019.     Activities of Daily Living In your present state of health, do you have any difficulty performing the following activities: 07/09/2019  Hearing? N  Vision? N  Difficulty concentrating or making decisions? N  Walking or climbing stairs? N  Dressing or bathing? N  Doing errands, shopping? N  Preparing Food and eating ? N  Using the Toilet? N  In the past six months, have you accidently leaked urine? N  Do you have problems with loss of bowel control? N  Managing your Medications? N  Managing your Finances? N  Housekeeping or managing your Housekeeping? N  Some recent data might be hidden    Patient Care Team: Jerrol Banana., MD as PCP - General (Family Medicine) Lorelee Cover., MD as Consulting Physician (Ophthalmology) Hayden Pedro, MD as Consulting Physician (Ophthalmology) Rubie Maid, MD as Referring Physician (Obstetrics and Gynecology)    Assessment:   This is a routine wellness examination for Anna Williams.  Exercise Activities and Dietary recommendations Current Exercise Habits: The patient does not participate in regular exercise at present, Exercise limited by: None identified  Goals    . DIET - INCREASE WATER INTAKE     Recommend to continue drinking 6-8 glasses of water a day.        Fall Risk: Fall Risk  07/09/2019 12/11/2018 12/15/2017 09/13/2017 09/13/2016  Falls in the past year? 0 0 No No No  Number falls in past yr: 0 - - - -  Injury with Fall? 0 - - - -    FALL RISK PREVENTION PERTAINING TO THE HOME:  Any stairs in or around the home? Yes  If so, are there any without handrails? No   Home free of loose throw rugs in walkways, pet beds, electrical cords, etc? Yes  Adequate lighting in your home to reduce risk of falls? Yes   ASSISTIVE DEVICES UTILIZED TO PREVENT FALLS:  Life alert? No  Use of a  cane, walker or w/c? No  Grab bars in the bathroom? Yes  Shower chair or bench in shower? Yes  Elevated toilet seat or a handicapped toilet? Yes   TIMED UP AND GO:  Was the test performed? No .    Depression Screen PHQ 2/9 Scores 07/09/2019 12/11/2018 12/15/2017 09/13/2017  PHQ - 2 Score 1 0 2 0  PHQ- 9 Score - - 12 -     Cognitive Function: Declined today.      6CIT Screen 09/13/2016  What Year? 0 points  What month? 0 points  What time? 0 points  Count back from 20 0 points  Months in reverse 0 points  Repeat phrase 0 points  Total Score 0    Immunization History  Administered Date(s) Administered  . Influenza, High Dose Seasonal PF 09/03/2015, 06/29/2016, 09/13/2017, 08/28/2018  . Pneumococcal Conjugate-13 09/03/2015  . Pneumococcal Polysaccharide-23 09/13/2017    Qualifies for Shingles Vaccine? Yes . Due for Shingrix. Education has been provided regarding the importance of this vaccine. Pt has been advised to call insurance company to determine out of pocket expense. Advised may also receive vaccine at local pharmacy or Health Dept. Verbalized acceptance and understanding.  Tdap: Although this vaccine is not a covered service during a Wellness Exam, does the patient still wish to receive this vaccine today?  No .   Flu Vaccine: Due for Flu vaccine. Does the patient want to receive this vaccine today?  No .   Pneumococcal Vaccine: Completed series  Screening Tests Health Maintenance  Topic Date Due  . Hepatitis C Screening  December 04, 1945  . FOOT EXAM  05/25/2018  . URINE MICROALBUMIN  05/25/2018  . OPHTHALMOLOGY EXAM  03/03/2019  . TETANUS/TDAP  07/08/2020 (Originally 06/03/1964)  . HEMOGLOBIN A1C  10/26/2019  . MAMMOGRAM  07/18/2020  . COLONOSCOPY  06/25/2024  . DEXA SCAN  07/18/2028  . INFLUENZA VACCINE  Completed  . PNA vac Low Risk Adult  Completed    Cancer  Screenings:  Colorectal Screening: Completed 06/25/14. Repeat every 10 years.  Mammogram: Completed  07/18/18. Scheduled for 07/25/19.  Bone Density: Completed 07/18/18. Results reflect NORMAL. No repeat needed unless advised by a physician.  Lung Cancer Screening: (Low Dose CT Chest recommended if Age 70-80 years, 30 pack-year currently smoking OR have quit w/in 15years.) does not qualify.   Additional Screening:  Hepatitis C Screening: does qualify; however declines a future order.  Dental Screening: Recommended annual dental exams for proper oral hygiene   Community Resource Referral:  CRR required this visit?  No       Plan:  I have personally reviewed and addressed the Medicare Annual Wellness questionnaire and have noted the following in the patient's chart:  A. Medical and social history B. Use of alcohol, tobacco or illicit drugs  C. Current medications and supplements D. Functional ability and status E.  Nutritional status F.  Physical activity G. Advance directives H. List of other physicians I.  Hospitalizations, surgeries, and ER visits in previous 12 months J.  Fellows such as hearing and vision if needed, cognitive and depression L. Referrals and appointments   In addition, I have reviewed and discussed with patient certain preventive protocols, quality metrics, and best practice recommendations. A written personalized care plan for preventive services as well as general preventive health recommendations were provided to patient.   Glendora Score, [place]  [phone] Nurse Health Advisor   Nurse Notes: Pt needs a diabetic foot exam and urine check at next in office visit. Declined a future Hep C lab order or scheduling an eye exam right now. Next apt moved up to 07/16/19 due to pt wanting to discuss weight gain from Pioglitazone.

## 2019-07-09 ENCOUNTER — Other Ambulatory Visit: Payer: Self-pay

## 2019-07-09 ENCOUNTER — Ambulatory Visit (INDEPENDENT_AMBULATORY_CARE_PROVIDER_SITE_OTHER): Payer: Medicare Other

## 2019-07-09 DIAGNOSIS — Z0000 Encounter for general adult medical examination without abnormal findings: Secondary | ICD-10-CM | POA: Diagnosis not present

## 2019-07-09 NOTE — Patient Instructions (Addendum)
[name] , Thank you for taking time to come for your Medicare Wellness Visit. I appreciate your ongoing commitment to your health goals. Please review the following plan we discussed and let me know if I can assist you in the future.   Screening recommendations/referrals: Colonoscopy: Up to date, 06/2024 Mammogram: Up to date, ordered 07/25/19 Bone Density: Up to date, previous DEXA was normal. No repeat needed unless advised by a physician. Recommended yearly ophthalmology/optometry visit for glaucoma screening and checkup Recommended yearly dental visit for hygiene and checkup  Vaccinations: Influenza vaccine: Currently due Pneumococcal vaccine: Completed series Tdap vaccine: Pt declines today.  Shingles vaccine: Pt declines today.     Advanced directives: Advance directive discussed with you today. I have provided a copy for you to complete at home and have notarized. Once this is complete please bring a copy in to our office so we can scan it into your chart.  Conditions/risks identified: Continue to increase water intake to 6-8 8 oz glasses a day.   Next appointment: 07/16/19 @ 3:40 PM with Dr Anna Williams.    Preventive Care 2 Years and Older, Female Preventive care refers to lifestyle choices and visits with your health care provider that can promote health and wellness. What does preventive care include?  A yearly physical exam. This is also called an annual well check.  Dental exams once or twice a year.  Routine eye exams. Ask your health care provider how often you should have your eyes checked.  Personal lifestyle choices, including:  Daily care of your teeth and gums.  Regular physical activity.  Eating a healthy diet.  Avoiding tobacco and drug use.  Limiting alcohol use.  Practicing safe sex.  Taking low-dose aspirin every day.  Taking vitamin and mineral supplements as recommended by your health care provider. What happens during an annual well check?  The services and screenings done by your health care provider during your annual well check will depend on your age, overall health, lifestyle risk factors, and family history of disease. Counseling  Your health care provider may ask you questions about your:  Alcohol use.  Tobacco use.  Drug use.  Emotional well-being.  Home and relationship well-being.  Sexual activity.  Eating habits.  History of falls.  Memory and ability to understand (cognition).  Work and work Statistician.  Reproductive health. Screening  You may have the following tests or measurements:  Height, weight, and BMI.  Blood pressure.  Lipid and cholesterol levels. These may be checked every 5 years, or more frequently if you are over 66 years old.  Skin check.  Lung cancer screening. You may have this screening every year starting at age 3 if you have a 30-pack-year history of smoking and currently smoke or have quit within the past 15 years.  Fecal occult blood test (FOBT) of the stool. You may have this test every year starting at age 2.  Flexible sigmoidoscopy or colonoscopy. You may have a sigmoidoscopy every 5 years or a colonoscopy every 10 years starting at age 47.  Hepatitis C blood test.  Hepatitis B blood test.  Sexually transmitted disease (STD) testing.  Diabetes screening. This is done by checking your blood sugar (glucose) after you have not eaten for a while (fasting). You may have this done every 1-3 years.  Bone density scan. This is done to screen for osteoporosis. You may have this done starting at age 74.  Mammogram. This may be done every 1-2 years. Talk to  your health care provider about how often you should have regular mammograms. Talk with your health care provider about your test results, treatment options, and if necessary, the need for more tests. Vaccines  Your health care provider may recommend certain vaccines, such as:  Influenza vaccine. This is  recommended every year.  Tetanus, diphtheria, and acellular pertussis (Tdap, Td) vaccine. You may need a Td booster every 10 years.  Zoster vaccine. You may need this after age 62.  Pneumococcal 13-valent conjugate (PCV13) vaccine. One dose is recommended after age 72.  Pneumococcal polysaccharide (PPSV23) vaccine. One dose is recommended after age 74. Talk to your health care provider about which screenings and vaccines you need and how often you need them. This information is not intended to replace advice given to you by your health care provider. Make sure you discuss any questions you have with your health care provider. Document Released: 10/24/2015 Document Revised: 06/16/2016 Document Reviewed: 07/29/2015 Elsevier Interactive Patient Education  2017 [place] Prevention in the Home Falls can cause injuries. They can happen to people of all ages. There are many things you can do to make your home safe and to help prevent falls. What can I do on the outside of my home?  Regularly fix the edges of walkways and driveways and fix any cracks.  Remove anything that might make you trip as you walk through a door, such as a raised step or threshold.  Trim any bushes or trees on the path to your home.  Use bright outdoor lighting.  Clear any walking paths of anything that might make someone trip, such as rocks or tools.  Regularly check to see if handrails are loose or broken. Make sure that both sides of any steps have handrails.  Any raised decks and porches should have guardrails on the edges.  Have any leaves, snow, or ice cleared regularly.  Use sand or salt on walking paths during winter.  Clean up any spills in your garage right away. This includes oil or grease spills. What can I do in the bathroom?  Use night lights.  Install grab bars by the toilet and in the tub and shower. Do not use towel bars as grab bars.  Use non-skid mats or decals in the tub or  shower.  If you need to sit down in the shower, use a plastic, non-slip stool.  Keep the floor dry. Clean up any water that spills on the floor as soon as it happens.  Remove soap buildup in the tub or shower regularly.  Attach bath mats securely with double-sided non-slip rug tape.  Do not have throw rugs and other things on the floor that can make you trip. What can I do in the bedroom?  Use night lights.  Make sure that you have a light by your bed that is easy to reach.  Do not use any sheets or blankets that are too big for your bed. They should not hang down onto the floor.  Have a firm chair that has side arms. You can use this for support while you get dressed.  Do not have throw rugs and other things on the floor that can make you trip. What can I do in the kitchen?  Clean up any spills right away.  Avoid walking on wet floors.  Keep items that you use a lot in easy-to-reach places.  If you need to reach something above you, use a strong step stool that has  a grab bar.  Keep electrical cords out of the way.  Do not use floor polish or wax that makes floors slippery. If you must use wax, use non-skid floor wax.  Do not have throw rugs and other things on the floor that can make you trip. What can I do with my stairs?  Do not leave any items on the stairs.  Make sure that there are handrails on both sides of the stairs and use them. Fix handrails that are broken or loose. Make sure that handrails are as long as the stairways.  Check any carpeting to make sure that it is firmly attached to the stairs. Fix any carpet that is loose or worn.  Avoid having throw rugs at the top or bottom of the stairs. If you do have throw rugs, attach them to the floor with carpet tape.  Make sure that you have a light switch at the top of the stairs and the bottom of the stairs. If you do not have them, ask someone to add them for you. What else can I do to help prevent falls?   Wear shoes that:  Do not have high heels.  Have rubber bottoms.  Are comfortable and fit you well.  Are closed at the toe. Do not wear sandals.  If you use a stepladder:  Make sure that it is fully opened. Do not climb a closed stepladder.  Make sure that both sides of the stepladder are locked into place.  Ask someone to hold it for you, if possible.  Clearly mark and make sure that you can see:  Any grab bars or handrails.  First and last steps.  Where the edge of each step is.  Use tools that help you move around (mobility aids) if they are needed. These include:  Canes.  Walkers.  Scooters.  Crutches.  Turn on the lights when you go into a dark area. Replace any light bulbs as soon as they burn out.  Set up your furniture so you have a clear path. Avoid moving your furniture around.  If any of your floors are uneven, fix them.  If there are any pets around you, be aware of where they are.  Review your medicines with your doctor. Some medicines can make you feel dizzy. This can increase your chance of falling. Ask your doctor what other things that you can do to help prevent falls. This information is not intended to replace advice given to you by your health care provider. Make sure you discuss any questions you have with your health care provider. Document Released: 07/24/2009 Document Revised: 03/04/2016 Document Reviewed: 11/01/2014 Elsevier Interactive Patient Education  2017 [employer].

## 2019-07-16 ENCOUNTER — Ambulatory Visit: Payer: Medicare Other | Admitting: Family Medicine

## 2019-07-19 ENCOUNTER — Encounter: Payer: Self-pay | Admitting: Family Medicine

## 2019-07-19 ENCOUNTER — Other Ambulatory Visit: Payer: Self-pay

## 2019-07-19 ENCOUNTER — Ambulatory Visit (INDEPENDENT_AMBULATORY_CARE_PROVIDER_SITE_OTHER): Payer: Medicare Other | Admitting: Family Medicine

## 2019-07-19 VITALS — BP 128/72 | HR 77 | Temp 98.2°F | Resp 16 | Wt 155.0 lb

## 2019-07-19 DIAGNOSIS — Z780 Asymptomatic menopausal state: Secondary | ICD-10-CM | POA: Diagnosis not present

## 2019-07-19 DIAGNOSIS — E119 Type 2 diabetes mellitus without complications: Secondary | ICD-10-CM | POA: Diagnosis not present

## 2019-07-19 LAB — POCT GLYCOSYLATED HEMOGLOBIN (HGB A1C): Hemoglobin A1C: 8.4 % — AB (ref 4.0–5.6)

## 2019-07-19 NOTE — Progress Notes (Signed)
Patient: Anna Williams Female    DOB: 1946/05/23   73 y.o.   MRN: [id number] Visit Date: 07/19/2019  Today's Provider: Wilhemena Durie, MD   Chief Complaint  Patient presents with  . Diabetes   Subjective:   HPI  Diabetes Mellitus Type II, Follow-up:   Lab Results  Component Value Date   HGBA1C 7.9 (A) 04/25/2019   HGBA1C 7.5 (A) 08/28/2018   HGBA1C 7.9 (A) 04/24/2018    Last seen for diabetes 4 months ago.  Management since then includes starting Actos 15mg  daily. She reports good compliance with treatment. She is not having side effects.  Current symptoms include none and have been stable. Home blood sugar records: trend: stable  Episodes of hypoglycemia? no   Current insulin regiment: Is not on insulin Most Recent Eye Exam: up to date Weight trend: stable Prior visit with dietician: No Current exercise: no regular exercise Current diet habits: well balanced  Pertinent Labs:    Component Value Date/Time   CHOL 170 12/20/2017 1132   TRIG 141 12/20/2017 1132   HDL 51 12/20/2017 1132   LDLCALC 91 12/20/2017 1132   CREATININE 0.86 12/20/2017 1132    Wt Readings from Last 3 Encounters:  07/19/19 155 lb (70.3 kg)  06/05/19 152 lb 8 oz (69.2 kg)  04/25/19 145 lb (65.8 kg)    Allergies  Allergen Reactions  . Codeine Other (See Comments)    Pt went to sleep and had trouble waking up. Very sensitive to medication.  . Lmx 4 [Lidocaine]      Current Outpatient Medications:  .  Calcium Citrate-Vitamin D (CALCIUM + D PO), Take 1 tablet by mouth daily., Disp: , Rfl:  .  citalopram (CELEXA) 20 MG tablet, TAKE 1/2 TABLET BY MOUTH DAILY, Disp: 45 tablet, Rfl: 8 .  glucose blood (ONE TOUCH ULTRA TEST) test strip, CHECK SUGAR ONCE DAILY, Disp: 100 each, Rfl: 11 .  meloxicam (MOBIC) 15 MG tablet, Take 1 tablet (15 mg total) by mouth daily. (Patient taking differently: Take 15 mg by mouth daily. As needed), Disp: 30 tablet, Rfl: 3 .  metFORMIN  (GLUCOPHAGE-XR) 500 MG 24 hr tablet, TAKE 1 TABLET BY MOUTH EVERY DAY WITH BREAKFAST, Disp: 90 tablet, Rfl: 3 .  Multiple Vitamins-Minerals (PRESERVISION/LUTEIN PO), Take by mouth daily. , Disp: , Rfl:  .  omeprazole (PRILOSEC) 20 MG capsule, TAKE 1 CAPSULE BY MOUTH EVERY DAY, Disp: 90 capsule, Rfl: 4 .  ONETOUCH DELICA LANCETS FINE MISC, Check sugar once daily, Disp: 50 each, Rfl: 12 .  pioglitazone (ACTOS) 15 MG tablet, Take 1 tablet (15 mg total) by mouth daily., Disp: 90 tablet, Rfl: 1 .  amLODipine (NORVASC) 2.5 MG tablet, TAKE 1 TABLET BY MOUTH EVERY DAY (Patient not taking: Reported on 06/05/2019), Disp: 90 tablet, Rfl: 1 .  JARDIANCE 25 MG TABS tablet, TAKE 1 TABLET BY MOUTH DAILY (Patient not taking: Reported on 06/05/2019), Disp: 30 tablet, Rfl: 11  Review of Systems  Constitutional: Negative for activity change.  Respiratory: Negative for cough and shortness of breath.   Cardiovascular: Negative for chest pain, palpitations and leg swelling.  Endocrine: Negative for cold intolerance, heat intolerance, polydipsia, polyphagia and polyuria.  Musculoskeletal: Negative for arthralgias.  Neurological: Negative for dizziness, facial asymmetry, light-headedness and headaches.  Psychiatric/Behavioral: Negative for agitation, self-injury, sleep disturbance and suicidal ideas. The patient is not nervous/anxious.     Social History   Tobacco Use  . Smoking status: Never Smoker  .  Smokeless tobacco: Never Used  Substance Use Topics  . Alcohol use: No      Objective:   BP 128/72   Pulse 77   Temp 98.2 F (36.8 C)   Resp 16   Wt 155 lb (70.3 kg)   SpO2 97%   BMI 24.28 kg/m  Vitals:   07/19/19 1625  BP: 128/72  Pulse: 77  Resp: 16  Temp: 98.2 F (36.8 C)  SpO2: 97%  Weight: 155 lb (70.3 kg)  Body mass index is 24.28 kg/m.   Physical Exam Vitals signs reviewed.  Constitutional:      Appearance: She is well-developed.  HENT:     Head: Normocephalic and atraumatic.      Right Ear: External ear normal.     Left Ear: External ear normal.     Nose: Nose normal.  Eyes:     General: No scleral icterus.    Conjunctiva/sclera: Conjunctivae normal.  Neck:     Thyroid: No thyromegaly.  Cardiovascular:     Rate and Rhythm: Normal rate and regular rhythm.     Heart sounds: Normal heart sounds.  Pulmonary:     Effort: Pulmonary effort is normal.     Breath sounds: Normal breath sounds.  Abdominal:     Palpations: Abdomen is soft.  Musculoskeletal:     Right lower leg: No edema.     Left lower leg: No edema.  Skin:    General: Skin is warm and dry.  Neurological:     General: No focal deficit present.     Mental Status: She is alert and oriented to person, place, and time.     Comments: Foot exam normal.  Psychiatric:        Mood and Affect: Mood normal.        Behavior: Behavior normal.        Thought Content: Thought content normal.        Judgment: Judgment normal.      No results found for any visits on 07/19/19.     Assessment & Plan    1. Diabetes mellitus type 2 without retinopathy ([place]) LDL is 91 but due to diabetes will start rosuvastatin 5 mg daily. - POCT glycosylated hemoglobin (Hb A1C)8.4  Patient wishes to stay on present medications.  She has gained a little weight on Actos..  Consider Ozempic or Trulicity on next visit. 1 month on statin. Normal foot exam today  2. Menopause     Wilhemena Durie, MD  [facility]

## 2019-07-25 ENCOUNTER — Ambulatory Visit
Admission: RE | Admit: 2019-07-25 | Discharge: 2019-07-25 | Disposition: A | Discharge status: Home / Self Care | Payer: Medicare Other | Source: Ambulatory Visit | Attending: Obstetrics and Gynecology | Admitting: Obstetrics and Gynecology

## 2019-07-25 DIAGNOSIS — Z1231 Encounter for screening mammogram for malignant neoplasm of breast: Secondary | ICD-10-CM | POA: Diagnosis not present

## 2019-08-07 ENCOUNTER — Ambulatory Visit: Payer: Self-pay | Admitting: Family Medicine

## 2019-08-22 NOTE — Progress Notes (Deleted)
   {  Method of visit:23308}  Patient: Anna Williams Female    DOB: Nov 15, 1945   73 y.o.   MRN: [id number] Visit Date: 08/22/2019  Today's Provider: Wilhemena Durie, MD   No chief complaint on file.  Subjective:     HPI   Diabetes mellitus type 2 without retinopathy ([place]) FROM 07/19/2019-LDL is 91 but due to diabetes will start rosuvastatin 5 mg daily. Hb A1C 8.4. Patient wishes to stay on present medications.  She has gained a little weight on Actos. Consider Ozempic or Trulicity on next visit. 1 month on statin. Normal foot exam today   Allergies  Allergen Reactions  . Codeine Other (See Comments)    Pt went to sleep and had trouble waking up. Very sensitive to medication.  . Lmx 4 [Lidocaine]      Current Outpatient Medications:  .  amLODipine (NORVASC) 2.5 MG tablet, TAKE 1 TABLET BY MOUTH EVERY DAY (Patient not taking: Reported on 06/05/2019), Disp: 90 tablet, Rfl: 1 .  Calcium Citrate-Vitamin D (CALCIUM + D PO), Take 1 tablet by mouth daily., Disp: , Rfl:  .  citalopram (CELEXA) 20 MG tablet, TAKE 1/2 TABLET BY MOUTH DAILY, Disp: 45 tablet, Rfl: 8 .  glucose blood (ONE TOUCH ULTRA TEST) test strip, CHECK SUGAR ONCE DAILY, Disp: 100 each, Rfl: 11 .  JARDIANCE 25 MG TABS tablet, TAKE 1 TABLET BY MOUTH DAILY (Patient not taking: Reported on 06/05/2019), Disp: 30 tablet, Rfl: 11 .  meloxicam (MOBIC) 15 MG tablet, Take 1 tablet (15 mg total) by mouth daily. (Patient taking differently: Take 15 mg by mouth daily. As needed), Disp: 30 tablet, Rfl: 3 .  metFORMIN (GLUCOPHAGE-XR) 500 MG 24 hr tablet, TAKE 1 TABLET BY MOUTH EVERY DAY WITH BREAKFAST, Disp: 90 tablet, Rfl: 3 .  Multiple Vitamins-Minerals (PRESERVISION/LUTEIN PO), Take by mouth daily. , Disp: , Rfl:  .  omeprazole (PRILOSEC) 20 MG capsule, TAKE 1 CAPSULE BY MOUTH EVERY DAY, Disp: 90 capsule, Rfl: 4 .  ONETOUCH DELICA LANCETS FINE MISC, Check sugar once daily, Disp: 50 each, Rfl: 12 .  pioglitazone (ACTOS) 15 MG  tablet, Take 1 tablet (15 mg total) by mouth daily., Disp: 90 tablet, Rfl: 1  Review of Systems  Constitutional: Negative for appetite change, chills, fatigue and fever.  Respiratory: Negative for chest tightness and shortness of breath.   Cardiovascular: Negative for chest pain and palpitations.  Gastrointestinal: Negative for abdominal pain, nausea and vomiting.  Neurological: Negative for dizziness and weakness.    Social History   Tobacco Use  . Smoking status: Never Smoker  . Smokeless tobacco: Never Used  Substance Use Topics  . Alcohol use: No      Objective:   There were no vitals taken for this visit. There were no vitals filed for this visit.There is no height or weight on file to calculate BMI.   Physical Exam   No results found for any visits on 08/23/19.     Assessment & Plan        Wilhemena Durie, MD  [facility]

## 2019-08-23 ENCOUNTER — Ambulatory Visit: Payer: Self-pay | Admitting: Family Medicine

## 2019-11-07 DIAGNOSIS — I781 Nevus, non-neoplastic: Secondary | ICD-10-CM | POA: Diagnosis not present

## 2019-11-07 DIAGNOSIS — L821 Other seborrheic keratosis: Secondary | ICD-10-CM | POA: Diagnosis not present

## 2019-11-08 ENCOUNTER — Other Ambulatory Visit: Payer: Self-pay | Admitting: Family Medicine

## 2019-11-08 DIAGNOSIS — E119 Type 2 diabetes mellitus without complications: Secondary | ICD-10-CM

## 2019-12-02 ENCOUNTER — Other Ambulatory Visit: Payer: Self-pay | Admitting: Family Medicine

## 2019-12-03 NOTE — Telephone Encounter (Signed)
Please advise 

## 2019-12-25 ENCOUNTER — Other Ambulatory Visit: Payer: Self-pay | Admitting: Family Medicine

## 2019-12-25 DIAGNOSIS — K219 Gastro-esophageal reflux disease without esophagitis: Secondary | ICD-10-CM

## 2019-12-25 NOTE — Telephone Encounter (Signed)
Requested Prescriptions  Pending Prescriptions Disp Refills  . omeprazole (PRILOSEC) 20 MG capsule [Pharmacy Med Name: OMEPRAZOLE DR 20 MG CAPSULE] 90 capsule 1    Sig: TAKE 1 CAPSULE BY MOUTH EVERY DAY     Gastroenterology: Proton Pump Inhibitors Passed - 12/25/2019  1:30 AM      Passed - Valid encounter within last 12 months    Recent Outpatient Visits          5 months ago Diabetes mellitus type 2 without retinopathy [facility])   [facility] Jerrol Banana., MD   8 months ago Diabetes mellitus type 2 without retinopathy [facility])   [facility] Jerrol Banana., MD   1 year ago Essential hypertension   [facility] Jerrol Banana., MD   1 year ago Essential hypertension   [facility] Jerrol Banana., MD   1 year ago Diabetes mellitus type 2 without retinopathy [facility])   [facility] Jerrol Banana., MD

## 2020-01-02 ENCOUNTER — Telehealth: Payer: Self-pay

## 2020-01-02 NOTE — Telephone Encounter (Signed)
Copied from [place] [id number]. Topic: General - Other >> Jan 02, 2020  8:47 AM Rainey Pines A wrote: [facility] called in regards to their records showing the patient has diabetes but isnt on a particular medication and will be faxing over documentation for Dr.Gilbert. Best contact [phone]

## 2020-01-25 ENCOUNTER — Other Ambulatory Visit: Payer: Self-pay | Admitting: Family Medicine

## 2020-01-31 DIAGNOSIS — I8392 Asymptomatic varicose veins of left lower extremity: Secondary | ICD-10-CM | POA: Diagnosis not present

## 2020-01-31 DIAGNOSIS — I781 Nevus, non-neoplastic: Secondary | ICD-10-CM | POA: Diagnosis not present

## 2020-01-31 DIAGNOSIS — I8391 Asymptomatic varicose veins of right lower extremity: Secondary | ICD-10-CM | POA: Diagnosis not present

## 2020-02-06 ENCOUNTER — Other Ambulatory Visit: Payer: Self-pay | Admitting: Family Medicine

## 2020-02-06 DIAGNOSIS — E119 Type 2 diabetes mellitus without complications: Secondary | ICD-10-CM

## 2020-02-06 NOTE — Telephone Encounter (Signed)
Requested Prescriptions  Pending Prescriptions Disp Refills  . pioglitazone (ACTOS) 15 MG tablet [Pharmacy Med Name: PIOGLITAZONE HCL 15 MG TABLET] 90 tablet 0    Sig: TAKE 1 TABLET BY MOUTH EVERY DAY     Endocrinology:  Diabetes - Glitazones - pioglitazone Failed - 02/06/2020  2:22 AM      Failed - HBA1C is between 0 and 7.9 and within 180 days    Hemoglobin A1C  Date Value Ref Range Status  07/19/2019 8.4 (A) 4.0 - 5.6 % Final   Hgb A1c MFr Bld  Date Value Ref Range Status  12/20/2017 7.5 (H) 4.8 - 5.6 % Final    Comment:             Prediabetes: 5.7 - 6.4          Diabetes: >6.4          Glycemic control for adults with diabetes: <7.0          Failed - Valid encounter within last 6 months    Recent Outpatient Visits          6 months ago Diabetes mellitus type 2 without retinopathy [facility])   [facility] Jerrol Banana., MD   9 months ago Diabetes mellitus type 2 without retinopathy [facility])   [facility] Jerrol Banana[facility]   1 year ago Essential hypertension   [facility] Jerrol Banana., MD   1 year ago Essential hypertension   [facility] Jerrol Banana., MD   1 year ago Diabetes mellitus type 2 without retinopathy [facility])   [facility] Jerrol Banana., MD

## 2020-03-05 NOTE — Progress Notes (Signed)
[name],acting as a scribe for Wilhemena Durie, MD.,have documented all relevant documentation on the behalf of Wilhemena Durie, MD,as directed by  Wilhemena Durie, MD while in the presence of Wilhemena Durie, MD.  Established patient visit   Patient: Anna Williams   DOB: 03-10-1946   74 y.o. Female  MRN: [id number] Visit Date: 03/06/2020  Today's healthcare provider: Wilhemena Durie, MD   Chief Complaint  Patient presents with  . Memory Issues   Subjective    HPI  Patient presents today for issues that she has been having with memory and speech for about 2 weeks. Her spouse says that she hit her head on the bed rail prior to.  Evidently the patient fell about a month ago ago and hit the back of her head.  In the past 2 weeks she has had some mental status changes with some memory loss and some headaches and trouble concentrating.  She is having a hard time with finding words at times but otherwise has not been due to bad decisions according to the husband.  This is a definite change from her baseline.      Medications: Outpatient Medications Prior to Visit  Medication Sig  . amLODipine (NORVASC) 2.5 MG tablet TAKE 1 TABLET BY MOUTH EVERY DAY (Patient not taking: Reported on 06/05/2019)  . Calcium Citrate-Vitamin D (CALCIUM + D PO) Take 1 tablet by mouth daily.  . citalopram (CELEXA) 20 MG tablet TAKE 1/2 TABLET BY MOUTH DAILY  . glucose blood (ONE TOUCH ULTRA TEST) test strip CHECK SUGAR ONCE DAILY  . JARDIANCE 25 MG TABS tablet TAKE 1 TABLET BY MOUTH DAILY (Patient not taking: Reported on 06/05/2019)  . meloxicam (MOBIC) 15 MG tablet TAKE 1 TABLET (15 MG TOTAL) BY MOUTH DAILY. AS NEEDED  . metFORMIN (GLUCOPHAGE-XR) 500 MG 24 hr tablet TAKE 1 TABLET BY MOUTH EVERY DAY WITH BREAKFAST  . Multiple Vitamins-Minerals (PRESERVISION/LUTEIN PO) Take by mouth daily.   Marland Kitchen omeprazole (PRILOSEC) 20 MG capsule TAKE 1 CAPSULE BY MOUTH EVERY DAY  . ONETOUCH DELICA  LANCETS FINE MISC Check sugar once daily  . pioglitazone (ACTOS) 15 MG tablet TAKE 1 TABLET BY MOUTH EVERY DAY   No facility-administered medications prior to visit.    Review of Systems  Constitutional: Negative for appetite change, chills, fatigue and fever.  HENT: Negative.   Eyes: Negative.   Respiratory: Negative for chest tightness and shortness of breath.   Cardiovascular: Negative for chest pain and palpitations.  Gastrointestinal: Negative for abdominal pain, nausea and vomiting.  Endocrine: Negative.   Musculoskeletal: Negative.   Allergic/Immunologic: Negative.   Neurological: Positive for speech difficulty and headaches. Negative for dizziness and weakness.       Mild mental status changes.  Psychiatric/Behavioral: Negative.        Objective    BP (!) 150/79 (BP Location: Right Arm, Patient Position: Sitting, Cuff Size: Normal)   Pulse 71   Temp (!) 97.1 F (36.2 C) (Temporal)   Ht 5\' 7"  (1.702 m)   Wt 161 lb 9.6 oz (73.3 kg)   BMI 25.31 kg/m  BP Readings from Last 3 Encounters:  03/06/20 (!) 150/79  07/19/19 128/72  06/05/19 123/70   Wt Readings from Last 3 Encounters:  03/06/20 161 lb 9.6 oz (73.3 kg)  07/19/19 155 lb (70.3 kg)  06/05/19 152 lb 8 oz (69.2 kg)      Physical Exam Vitals reviewed.  Constitutional:  Appearance: She is well-developed.  HENT:     Head: Normocephalic and atraumatic.     Right Ear: External ear normal.     Left Ear: External ear normal.     Nose: Nose normal.  Eyes:     General: No scleral icterus.    Conjunctiva/sclera: Conjunctivae normal.  Neck:     Thyroid: No thyromegaly.  Cardiovascular:     Rate and Rhythm: Normal rate and regular rhythm.     Heart sounds: Normal heart sounds.  Pulmonary:     Effort: Pulmonary effort is normal.     Breath sounds: Normal breath sounds.  Abdominal:     Palpations: Abdomen is soft.  Musculoskeletal:     Right lower leg: No edema.     Left lower leg: No edema.  Skin:     General: Skin is warm and dry.  Neurological:     General: No focal deficit present.     Mental Status: She is alert and oriented to person, place, and time.     Cranial Nerves: No cranial nerve deficit.     Sensory: No sensory deficit.     Coordination: Coordination normal.     Gait: Gait normal.  Psychiatric:        Mood and Affect: Mood normal.        Behavior: Behavior normal.        Thought Content: Thought content normal.        Judgment: Judgment normal.       No results found for any visits on 03/06/20.  Assessment & Plan     1. Traumatic injury of head, initial encounter Discussed possible imaging of the neck but she is completely nontender over C-spine.  She struck the back of her head against the head rail. - Ambulatory referral to Neurology - CT Head Wo Contrast  2. Concussion without loss of consciousness, initial encounter  - Ambulatory referral to Neurology - CT Head Wo Contrast  3. Altered mental status, unspecified altered mental status type At this time get CT of the head without contrast.  May need MRI if symptoms persist.  Refer to neurology.  Start fish oil daily. - Ambulatory referral to Neurology - CT Head Wo Contrast  4. Diabetes mellitus type 2 without retinopathy ([place]) Clinically stable.   No follow-ups on file.      I, Wilhemena Durie, MD, have reviewed all documentation for this visit. The documentation on 03/09/20 for the exam, diagnosis, procedures, and orders are all accurate and complete.    Dekisha Mesmer Cranford Mon, MD  [facility] [phone] (phone) [phone] (fax)  [place]

## 2020-03-06 ENCOUNTER — Ambulatory Visit (INDEPENDENT_AMBULATORY_CARE_PROVIDER_SITE_OTHER): Payer: Medicare Other | Admitting: Family Medicine

## 2020-03-06 ENCOUNTER — Encounter: Payer: Self-pay | Admitting: Family Medicine

## 2020-03-06 VITALS — BP 150/79 | HR 71 | Temp 97.1°F | Ht 67.0 in | Wt 161.6 lb

## 2020-03-06 DIAGNOSIS — R4182 Altered mental status, unspecified: Secondary | ICD-10-CM | POA: Diagnosis not present

## 2020-03-06 DIAGNOSIS — E119 Type 2 diabetes mellitus without complications: Secondary | ICD-10-CM | POA: Diagnosis not present

## 2020-03-06 DIAGNOSIS — S060X0A Concussion without loss of consciousness, initial encounter: Secondary | ICD-10-CM

## 2020-03-06 DIAGNOSIS — S0990XA Unspecified injury of head, initial encounter: Secondary | ICD-10-CM | POA: Diagnosis not present

## 2020-03-06 NOTE — Patient Instructions (Signed)
TAKE OTC FISH OIL DAILY!!!

## 2020-03-10 ENCOUNTER — Inpatient Hospital Stay: Payer: Medicare Other

## 2020-03-10 ENCOUNTER — Inpatient Hospital Stay
Admission: EM | Admit: 2020-03-10 | Discharge: 2020-03-12 | DRG: 066 | Disposition: A | Discharge status: Home / Self Care | Payer: Medicare Other | Attending: Internal Medicine | Admitting: Internal Medicine

## 2020-03-10 ENCOUNTER — Emergency Department: Payer: Medicare Other

## 2020-03-10 ENCOUNTER — Other Ambulatory Visit: Payer: Self-pay

## 2020-03-10 ENCOUNTER — Encounter: Payer: Self-pay | Admitting: Emergency Medicine

## 2020-03-10 DIAGNOSIS — Z7984 Long term (current) use of oral hypoglycemic drugs: Secondary | ICD-10-CM

## 2020-03-10 DIAGNOSIS — I639 Cerebral infarction, unspecified: Principal | ICD-10-CM | POA: Diagnosis present

## 2020-03-10 DIAGNOSIS — Z803 Family history of malignant neoplasm of breast: Secondary | ICD-10-CM

## 2020-03-10 DIAGNOSIS — Z79899 Other long term (current) drug therapy: Secondary | ICD-10-CM | POA: Diagnosis not present

## 2020-03-10 DIAGNOSIS — Z20822 Contact with and (suspected) exposure to covid-19: Secondary | ICD-10-CM | POA: Diagnosis not present

## 2020-03-10 DIAGNOSIS — I6389 Other cerebral infarction: Secondary | ICD-10-CM | POA: Diagnosis not present

## 2020-03-10 DIAGNOSIS — R29701 NIHSS score 1: Secondary | ICD-10-CM | POA: Diagnosis present

## 2020-03-10 DIAGNOSIS — Z833 Family history of diabetes mellitus: Secondary | ICD-10-CM | POA: Diagnosis not present

## 2020-03-10 DIAGNOSIS — F419 Anxiety disorder, unspecified: Secondary | ICD-10-CM

## 2020-03-10 DIAGNOSIS — Z808 Family history of malignant neoplasm of other organs or systems: Secondary | ICD-10-CM | POA: Diagnosis not present

## 2020-03-10 DIAGNOSIS — I7389 Other specified peripheral vascular diseases: Secondary | ICD-10-CM | POA: Diagnosis not present

## 2020-03-10 DIAGNOSIS — Z8673 Personal history of transient ischemic attack (TIA), and cerebral infarction without residual deficits: Secondary | ICD-10-CM | POA: Diagnosis not present

## 2020-03-10 DIAGNOSIS — Z9049 Acquired absence of other specified parts of digestive tract: Secondary | ICD-10-CM

## 2020-03-10 DIAGNOSIS — Z885 Allergy status to narcotic agent status: Secondary | ICD-10-CM | POA: Diagnosis not present

## 2020-03-10 DIAGNOSIS — R42 Dizziness and giddiness: Secondary | ICD-10-CM | POA: Diagnosis not present

## 2020-03-10 DIAGNOSIS — Z801 Family history of malignant neoplasm of trachea, bronchus and lung: Secondary | ICD-10-CM

## 2020-03-10 DIAGNOSIS — Z791 Long term (current) use of non-steroidal anti-inflammatories (NSAID): Secondary | ICD-10-CM | POA: Diagnosis not present

## 2020-03-10 DIAGNOSIS — R4701 Aphasia: Secondary | ICD-10-CM | POA: Diagnosis not present

## 2020-03-10 DIAGNOSIS — E785 Hyperlipidemia, unspecified: Secondary | ICD-10-CM | POA: Diagnosis not present

## 2020-03-10 DIAGNOSIS — Z981 Arthrodesis status: Secondary | ICD-10-CM

## 2020-03-10 DIAGNOSIS — Z86711 Personal history of pulmonary embolism: Secondary | ICD-10-CM

## 2020-03-10 DIAGNOSIS — I10 Essential (primary) hypertension: Secondary | ICD-10-CM | POA: Diagnosis not present

## 2020-03-10 DIAGNOSIS — Z8249 Family history of ischemic heart disease and other diseases of the circulatory system: Secondary | ICD-10-CM | POA: Diagnosis not present

## 2020-03-10 DIAGNOSIS — E119 Type 2 diabetes mellitus without complications: Secondary | ICD-10-CM | POA: Diagnosis not present

## 2020-03-10 DIAGNOSIS — R471 Dysarthria and anarthria: Secondary | ICD-10-CM | POA: Diagnosis not present

## 2020-03-10 DIAGNOSIS — Z03818 Encounter for observation for suspected exposure to other biological agents ruled out: Secondary | ICD-10-CM | POA: Diagnosis not present

## 2020-03-10 LAB — DIFFERENTIAL
Abs Immature Granulocytes: 0.01 10*3/uL (ref 0.00–0.07)
Basophils Absolute: 0 10*3/uL (ref 0.0–0.1)
Basophils Relative: 1 %
Eosinophils Absolute: 0.1 10*3/uL (ref 0.0–0.5)
Eosinophils Relative: 2 %
Immature Granulocytes: 0 %
Lymphocytes Relative: 33 %
Lymphs Abs: 1.9 10*3/uL (ref 0.7–4.0)
Monocytes Absolute: 0.4 10*3/uL (ref 0.1–1.0)
Monocytes Relative: 7 %
Neutro Abs: 3.4 10*3/uL (ref 1.7–7.7)
Neutrophils Relative %: 57 %

## 2020-03-10 LAB — CBC
HCT: 40.2 % (ref 36.0–46.0)
Hemoglobin: 14.3 g/dL (ref 12.0–15.0)
MCH: 32.4 pg (ref 26.0–34.0)
MCHC: 35.6 g/dL (ref 30.0–36.0)
MCV: 91.2 fL (ref 80.0–100.0)
Platelets: 155 10*3/uL (ref 150–400)
RBC: 4.41 MIL/uL (ref 3.87–5.11)
RDW: 12.5 % (ref 11.5–15.5)
WBC: 5.9 10*3/uL (ref 4.0–10.5)
nRBC: 0 % (ref 0.0–0.2)

## 2020-03-10 LAB — COMPREHENSIVE METABOLIC PANEL
ALT: 34 U/L (ref 0–44)
AST: 33 U/L (ref 15–41)
Albumin: 4.3 g/dL (ref 3.5–5.0)
Alkaline Phosphatase: 68 U/L (ref 38–126)
Anion gap: 10 (ref 5–15)
BUN: 9 mg/dL (ref 8–23)
CO2: 28 mmol/L (ref 22–32)
Calcium: 9.5 mg/dL (ref 8.9–10.3)
Chloride: 103 mmol/L (ref 98–111)
Creatinine, Ser: 1.03 mg/dL — ABNORMAL HIGH (ref 0.44–1.00)
GFR calc Af Amer: >60 mL/min (ref >60)
GFR calc non Af Amer: 54 mL/min — ABNORMAL LOW (ref >60)
Glucose, Bld: 160 mg/dL — ABNORMAL HIGH (ref 70–99)
Potassium: 3.7 mmol/L (ref 3.5–5.1)
Sodium: 141 mmol/L (ref 135–145)
Total Bilirubin: 0.9 mg/dL (ref 0.3–1.2)
Total Protein: 7.2 g/dL (ref 6.5–8.1)

## 2020-03-10 LAB — GLUCOSE, CAPILLARY
Glucose-Capillary: 152 mg/dL — ABNORMAL HIGH (ref 70–99)
Glucose-Capillary: 200 mg/dL — ABNORMAL HIGH (ref 70–99)

## 2020-03-10 LAB — PROTIME-INR
INR: 1 (ref 0.8–1.2)
Prothrombin Time: 13.2 seconds (ref 11.4–15.2)

## 2020-03-10 LAB — APTT: aPTT: 29 seconds (ref 24–36)

## 2020-03-10 MED ORDER — ASPIRIN 81 MG PO CHEW
324.0000 mg | CHEWABLE_TABLET | Freq: Once | ORAL | Status: AC
  Administered 2020-03-10: 324 mg via ORAL
  Filled 2020-03-10: qty 4

## 2020-03-10 MED ORDER — ENOXAPARIN SODIUM 40 MG/0.4ML ~~LOC~~ SOLN
40.0000 mg | SUBCUTANEOUS | Status: DC
  Administered 2020-03-11 (×2): 40 mg via SUBCUTANEOUS
  Filled 2020-03-10 (×2): qty 0.4

## 2020-03-10 MED ORDER — ACETAMINOPHEN 325 MG PO TABS: 650.0000 mg | ORAL_TABLET | ORAL | Status: DC | PRN

## 2020-03-10 MED ORDER — ASPIRIN 300 MG RE SUPP: 300.0000 mg | Freq: Every day | RECTAL | Status: DC

## 2020-03-10 MED ORDER — CLOPIDOGREL BISULFATE 75 MG PO TABS
75.0000 mg | ORAL_TABLET | Freq: Every day | ORAL | Status: DC
  Administered 2020-03-10 – 2020-03-12 (×3): 75 mg via ORAL
  Filled 2020-03-10 (×3): qty 1

## 2020-03-10 MED ORDER — ASPIRIN EC 325 MG PO TBEC
325.0000 mg | DELAYED_RELEASE_TABLET | Freq: Every day | ORAL | Status: DC
  Administered 2020-03-11 – 2020-03-12 (×2): 325 mg via ORAL
  Filled 2020-03-10 (×2): qty 1

## 2020-03-10 MED ORDER — STROKE: EARLY STAGES OF RECOVERY BOOK: Freq: Once | Status: AC

## 2020-03-10 MED ORDER — SENNOSIDES-DOCUSATE SODIUM 8.6-50 MG PO TABS: 1.0000 | ORAL_TABLET | Freq: Every evening | ORAL | Status: DC | PRN

## 2020-03-10 MED ORDER — SODIUM CHLORIDE 0.9 % IV SOLN: INTRAVENOUS | Status: DC

## 2020-03-10 MED ORDER — ACETAMINOPHEN 650 MG RE SUPP: 650.0000 mg | RECTAL | Status: DC | PRN

## 2020-03-10 MED ORDER — INSULIN ASPART 100 UNIT/ML ~~LOC~~ SOLN: 0.0000 [IU] | Freq: Every day | SUBCUTANEOUS | Status: DC

## 2020-03-10 MED ORDER — INSULIN ASPART 100 UNIT/ML ~~LOC~~ SOLN
0.0000 [IU] | Freq: Three times a day (TID) | SUBCUTANEOUS | Status: DC
  Administered 2020-03-11: 09:00:00 1 [IU] via SUBCUTANEOUS
  Administered 2020-03-11 – 2020-03-12 (×3): 2 [IU] via SUBCUTANEOUS
  Filled 2020-03-10 (×4): qty 1

## 2020-03-10 MED ORDER — SODIUM CHLORIDE 0.9% FLUSH
3.0000 mL | Freq: Once | INTRAVENOUS | Status: DC
Start: 2020-03-10 — End: 2020-03-12

## 2020-03-10 MED ORDER — ACETAMINOPHEN 160 MG/5ML PO SOLN
650.0000 mg | ORAL | Status: DC | PRN
  Filled 2020-03-10: qty 20.3

## 2020-03-10 NOTE — ED Notes (Signed)
Explained delay on in-patient room to husband/pt. Pt/husband understanding. Deny any needs currently. Bed locked low. Rail up. Call bell within reach.

## 2020-03-10 NOTE — ED Provider Notes (Signed)
[facility] Emergency Department Provider Note   ____________________________________________   First MD Initiated Contact with Patient 03/10/20 1747     (approximate)  I have reviewed the triage vital signs and the nursing notes.   HISTORY  Chief Complaint Altered Mental Status    HPI Anna Williams is a 74 y.o. female with past medical history of hypertension, diabetes, and PE who presents to the ED complaining of difficulty speaking.  Approximately 1 month ago, patient states she fell backwards off of to [employer] and struck her head, does not think she lost consciousness.  She seemed to be doing well afterwards but 2 weeks ago had an episode of severe headache with difficulty finishing sentences and reading menus.  She saw her PCP for this problem and was scheduled to get CT scan, but this has not yet happened.  Her symptoms seem to improve, but then today she woke up after a party with difficulty speaking.  Her husband reports that her words seemed jumbled and did not make sense and when symptoms persisted he decided to bring her to the ED.  Her last known well was around 315, patient reports that speech problems have improved but not resolved.  She has otherwise been well with no fevers, cough, chest pain, shortness of breath.  She denies any vision changes, numbness, or weakness.        Past Medical History:  Diagnosis Date  . Anxiety and depression   . ASCUS with positive high risk HPV 09/2013  . Diabetes mellitus without complication ([place])    type 2  . Elevated blood sugar   . Family history of breast cancer in mother   . Fibroid uterus   . History of pulmonary embolus (PE)   . Hypertension   . Menopause   . Mild dysplasia of cervix 09/2013   repeat pap done -ecc neg, cerival bx cin 1  . Retinal detachment     Patient Active Problem List   Diagnosis Date Noted  . CVA (cerebral vascular accident) ([place]) 03/10/2020  . Vaginal atrophy  04/29/2016  . Fibroid, uterine 04/29/2016  . Pelvic pain in female 04/29/2016  . Hypertension 10/16/2015  . Diabetes mellitus type 2 without retinopathy ([place]) 05/27/2015  . Menopause 04/29/2015  . Fibroids 04/29/2015  . Anxiety 04/29/2015  . Cervical dysplasia 04/29/2015  . History of pulmonary embolus (PE) 04/29/2015  . Family history of breast cancer in mother 04/29/2015  . Retinal detachment 04/29/2015    Past Surgical History:  Procedure Laterality Date  . anterior neck fusion    . BREAST CYST ASPIRATION Left yrs ago  . CHOLECYSTECTOMY  2010  . DILATION AND CURETTAGE OF UTERUS    . HYSTEROSCOPY    . mole removed  2014    Prior to Admission medications   Medication Sig Start Date End Date Taking? Authorizing Provider  amLODipine (NORVASC) 2.5 MG tablet TAKE 1 TABLET BY MOUTH EVERY DAY Patient not taking: Reported on 06/05/2019 02/01/19   Jerrol Banana., MD  Calcium Citrate-Vitamin D (CALCIUM + D PO) Take 1 tablet by mouth daily.    [provider]  citalopram (CELEXA) 20 MG tablet TAKE 1/2 TABLET BY MOUTH DAILY 04/02/19   Jerrol Banana., MD  glucose blood (ONE TOUCH ULTRA TEST) test strip CHECK SUGAR ONCE DAILY 11/30/17   Jerrol Banana., MD  JARDIANCE 25 MG TABS tablet TAKE 1 TABLET BY MOUTH DAILY Patient not taking: Reported on  06/05/2019 10/03/18   Jerrol Banana., MD  meloxicam (MOBIC) 15 MG tablet TAKE 1 TABLET (15 MG TOTAL) BY MOUTH DAILY. AS NEEDED 01/25/20   Jerrol Banana., MD  metFORMIN (GLUCOPHAGE-XR) 500 MG 24 hr tablet TAKE 1 TABLET BY MOUTH EVERY DAY WITH BREAKFAST 05/29/19   Jerrol Banana., MD  Multiple Vitamins-Minerals (PRESERVISION/LUTEIN PO) Take by mouth daily.     [provider]  omeprazole (PRILOSEC) 20 MG capsule TAKE 1 CAPSULE BY MOUTH EVERY DAY 12/25/19   Jerrol Banana., MD  [facility] LANCETS FINE MISC Check sugar once daily 05/13/15   Jerrol Banana., MD  pioglitazone (ACTOS)  15 MG tablet TAKE 1 TABLET BY MOUTH EVERY DAY 02/06/20   Jerrol Banana., MD    Allergies Codeine and Lmx 4 [lidocaine]  Family History  Problem Relation Age of Onset  . Breast cancer Mother 59       reccurence age 57  . Heart disease Father   . Throat cancer Brother   . Heart disease Brother   . Lung cancer Brother        with mets to brain  . Breast cancer Maternal Aunt   . Colon cancer Neg Hx   . Ovarian cancer Neg Hx     Social History Social History   Tobacco Use  . Smoking status: Never Smoker  . Smokeless tobacco: Never Used  Substance Use Topics  . Alcohol use: No  . Drug use: No    Review of Systems  Constitutional: No fever/chills Eyes: No visual changes. ENT: No sore throat. Cardiovascular: Denies chest pain. Respiratory: Denies shortness of breath. Gastrointestinal: No abdominal pain.  No nausea, no vomiting.  No diarrhea.  No constipation. Genitourinary: Negative for dysuria. Musculoskeletal: Negative for back pain. Skin: Negative for rash. Neurological: Negative for headaches, focal weakness or numbness.  Positive for speech difficulty.  ____________________________________________   PHYSICAL EXAM:  VITAL SIGNS: ED Triage Vitals  Enc Vitals Group     BP 03/10/20 1735 (!) 187/74     Pulse Rate 03/10/20 1735 81     Resp 03/10/20 1735 20     Temp 03/10/20 1735 98.4 F (36.9 C)     Temp Source 03/10/20 1735 Oral     SpO2 03/10/20 1735 97 %     Weight 03/10/20 1735 160 lb (72.6 kg)     Height 03/10/20 1735 5\' 7"  (1.702 m)     Head Circumference --      Peak Flow --      Pain Score 03/10/20 1734 0     Pain Loc --      Pain Edu? --      Excl. in [place]? --     Constitutional: Alert and oriented. Eyes: Conjunctivae are normal. Head: Atraumatic. Nose: No congestion/rhinnorhea. Mouth/Throat: Mucous membranes are moist. Neck: Normal ROM Cardiovascular: Normal rate, regular rhythm. Grossly normal heart sounds. Respiratory: Normal  respiratory effort.  No retractions. Lungs CTAB. Gastrointestinal: Soft and nontender. No distention. Genitourinary: deferred Musculoskeletal: No lower extremity tenderness nor edema. Neurologic: Slowed speech pattern, no word finding difficulties.  Able to name objects without difficulty. No gross focal neurologic deficits are appreciated. Skin:  Skin is warm, dry and intact. No rash noted. Psychiatric: Mood and affect are normal. Speech and behavior are normal.  ____________________________________________   LABS (all labs ordered are listed, but only abnormal results are displayed)  Labs Reviewed  GLUCOSE, CAPILLARY - Abnormal; Notable for  the following components:      Result Value   Glucose-Capillary 152 (*)    All other components within normal limits  COMPREHENSIVE METABOLIC PANEL - Abnormal; Notable for the following components:   Glucose, Bld 160 (*)    Creatinine, Ser 1.03 (*)    GFR calc non Af Amer 54 (*)    All other components within normal limits  SARS CORONAVIRUS 2 (TAT 6-24 HRS)  PROTIME-INR  APTT  CBC  DIFFERENTIAL  CBG MONITORING, ED   ____________________________________________  EKG  ED ECG REPORT I, Blake Divine, the attending physician, personally viewed and interpreted this ECG.   Date: 03/10/2020  EKG Time: 17:32  Rate: 79  Rhythm: normal sinus rhythm  Axis: Normal  Intervals:none  ST&T Change: None   PROCEDURES  Procedure(s) performed (including Critical Care):  Procedures   ____________________________________________   INITIAL IMPRESSION / ASSESSMENT AND PLAN / ED COURSE       74 year old female with past medical history of hypertension, diabetes, and PE who presents to the ED complaining of intermittent headaches and cognitive difficulties following a fall about 1 month ago, had another episode today where she woke up from a nap with difficulty finding words.  She has a slowed speech pattern on my evaluation, but is able to  name objects without difficulty and otherwise does not have any focal neurologic deficits.  Code stroke was called, although her symptoms seem to be improving.  Notified by radiology that CT head is positive for basal ganglia stroke and given her aphasia we will order CT angiogram of head and neck.  Patient currently being evaluated by teleneurology.  Patient evaluated by teleneurology and CT findings seem to represent subacute stroke given time course of symptoms.  We will hold off on CT angiogram given she is outside the window for any intervention.  Patient loaded with aspirin and case discussed with hospitalist for admission.      ____________________________________________   FINAL CLINICAL IMPRESSION(S) / ED DIAGNOSES  Final diagnoses:  Cerebrovascular accident (CVA), unspecified mechanism ([place])  Aphasia     ED Discharge Orders    None       Note:  This document was prepared using Dragon voice recognition software and may include unintentional dictation errors.   Blake Divine, MD 03/10/20 [phone]

## 2020-03-10 NOTE — ED Triage Notes (Addendum)
Pt presents to ED via POV with c/o AMS. Per pt's husband pt was seen at PCP on Thursday due to approx 1 month pt fell and bumped her head, then 2 weeks ago pt had severe HA, couldn't finish sentences, hard to read menus, slow to make decisions. [name] husband reports pt was supposed to have CT scan on Friday but did not have CT due to holiday. [name] husband reports today pt was taking a nap, when she woke up she was dizzy. [name] husband reports 2 mins after waking up pt's words became jumbled, pt was aphasic. [name] husband reports pt symptoms lasted approx 10 mins, reports symptoms started improving.   Pt with some aphasia, reports that a watch is a cell phone and some difficulty with regular speech, some word finding noted in triage.   Pt is alert and oriented to time, place, person, disoriented to situation. No slurred speech in triage. Equal grip strengths, no drift noted to patient's arms/legs in triage.   [name] husband reports [id number].

## 2020-03-10 NOTE — ED Notes (Signed)
Pt given cup of water. Husband has dinner at bedside for pt. EDP [place] verbal okay for pt to eat.

## 2020-03-10 NOTE — Consult Note (Signed)
[facility] Tele[facility]urology Consult Services   Date of Service:   03/10/2020 18:05:09  Impression:     .  I69.4 - Sequelae of stroke, not specified as haemorrhage or infarction  Comments/Sign-Out: Subacute left basal ganglia region stroke evident on CT head. Recommend admission for stroke workup including MRI brain, MRA head and neck, TTE. Initiate dual antiplatelet therapy.  Metrics: Last Known Well: Unknown [facility] Notification Time: 03/10/2020 18:05:09 Arrival Time: 03/10/2020 17:28:00 Stamp Time: 03/10/2020 18:05:09 Time First Login Attempt: 03/10/2020 18:05:55 Symptoms: speech changes NIHSS Start Assessment Time: 03/10/2020 18:12:12 Patient is not a candidate for Alteplase/Activase. Alteplase Medical Decision: 03/10/2020 18:11:31 Patient was not deemed candidate for Alteplase/Activase thrombolytics because of following reasons: Last Well Known Above 4.5 Hours.  CT head was reviewed and results were: subacute infarct left basal ganglia  Clinical Presentation is not Suggestive of Large Vessel Occlusive Disease  ED Physician notified of diagnostic impression and management plan on 03/10/2020 18:20:52  Our recommendations are outlined below.  Recommendations:     .  Activate Stroke Protocol Admission/Order Set     .  Stroke/Telemetry Floor     .  Neuro Checks     .  Bedside Swallow Eval     .  DVT Prophylaxis     .  IV Fluids, Normal Saline     .  Head of Bed 30 Degrees     .  Euglycemia and Avoid Hyperthermia (PRN Acetaminophen)     .  Initiate Aspirin 81 MG Daily     .  Initiate Plavix 75 MG Daily     .   Admission for further evaluation and workup is recommended, including MRI brain, Transthoracic Echocardiogram (TTE), fasting lipids, telemetry, blood pressure monitoring, swallow evaluation for dysphagia, and neurological followup.     .  Additionally dual antiplatelet therapy (ASA +plavix) x 21 days then single antiplatelet and high doses  statin are recommended for stroke/TIA prophylaxis.     Marland Kitchen  Permissive hypertension is recommended for 24 hours followed by a goal of normotension.     .  Stroke risk factor modification should include a goal of LDL <70, BP <130/80, a heart-healthy diet, evaluation for the possibility of sleep apnea, and at least 40 mins of cardiovascular exercise 5 days per week.  Routine Consultation with [facility] for Follow up Care  Sign Out:     .  Discussed with Emergency Department Provider    ------------------------------------------------------------------------------  History of Present Illness: Patient is a 74 year old Female.  Patient was brought by private transportation with symptoms of speech changes  The patient had a severe headache a few weeks ago and after that could not complete sentences. She has not been back to baseline since this started 2-3 weeks ago. Today she awoke from a nap and she was dizzy, could not complete what she wanted to say. This has since improved.   Past Medical History:     . Diabetes Mellitus  Anticoagulant use:  No  Antiplatelet use: No    Examination: BP(165/64), Pulse(81), Blood Glucose(152) 1A: Level of Consciousness - Alert; keenly responsive + 0 1B: Ask Month and Age - Both Questions Right + 0 1C: Blink Eyes & Squeeze Hands - Performs Both Tasks + 0 2: Test Horizontal Extraocular Movements - Normal + 0 3: Test Visual Fields - No Visual Loss + 0 4: Test Facial Palsy (Use Grimace if Obtunded) - Minor paralysis (flat nasolabial fold, smile asymmetry) + 1 5A: Test Left Arm  Motor Drift - No Drift for 10 Seconds + 0 5B: Test Right Arm Motor Drift - No Drift for 10 Seconds + 0 6A: Test Left Leg Motor Drift - No Drift for 5 Seconds + 0 6B: Test Right Leg Motor Drift - No Drift for 5 Seconds + 0 7: Test Limb Ataxia (FNF/Heel-Shin) - No Ataxia + 0 8: Test Sensation - Normal; No sensory loss + 0 9: Test Language/Aphasia - Normal; No aphasia +  0 10: Test Dysarthria - Normal + 0 11: Test Extinction/Inattention - No abnormality + 0  NIHSS Score: 1    Patient/Family was informed the Neurology Consult would occur via TeleHealth consult by way of interactive audio and video telecommunications and consented to receiving care in this manner.   Patient is being evaluated for possible acute neurologic impairment and high probability of imminent or life-threatening deterioration. I spent total of 18 minutes providing care to this patient, including time for face to face visit via telemedicine, review of medical records, imaging studies and discussion of findings with providers, the patient and/or family.   Dr Jeralene Huff   [facility] [phone]  Case [id number]

## 2020-03-10 NOTE — Progress Notes (Signed)
   03/10/20 1744  Clinical Encounter Type  Visited With Patient and family together  Visit Type Initial  Referral From Nurse  Consult/Referral To Chaplain  Spiritual Encounters  Spiritual Needs Prayer;Emotional  Marianna responded to Code Stroke at 1744. Pt was well dressed and lying on hospital bed with its head raised several inches. Pt was alert and awake. Was not too talkative but gave proper responses when asked. Husband was present. [place] provided pastoral care through completing initial spiritual care screening. Pt has strong support system. Prayed with patient and husband upon request. No further needs expressed at this time.

## 2020-03-10 NOTE — ED Notes (Signed)
EDP [place] at bedside 

## 2020-03-10 NOTE — H&P (Signed)
History and Physical   Anna Williams [id number] DOB: 1946/01/31 DOA: 03/10/2020  Referring MD/NP/PA: Dr. Charna Archer  PCP: Jerrol Banana., MD   Outpatient Specialists: None  Patient coming from: Home  Chief Complaint: Altered mental status and weakness  HPI: Anna Williams is a 74 y.o. female with medical history significant of anxiety disorder, diabetes, hypertension, hyperlipidemia, history of pulmonary embolism, who came to the ER with complaint of dysarthria today.  She had some spell about 2 weeks ago.  Reported feeling weak at the time but it passed.  Today however she had an episode where she felt weak all over but especially in the lower extremities and had difficulty speaking.  Denied any sick contacts.  No fever or chills.  Denied any dizziness.  Patient was seen by her PCP after hospital about 2 weeks ago with plan for an outpatient CT scan that has not been done yet.  In the ER initial head CT done showed remote CVA.  Patient's symptoms have slightly improved but not back to normal.  She is being admitted to the hospital for further evaluation..  ED Course: Temperature is 98.4 blood pressure 187/74 pulse 81 respirate 22 oxygen sat 95% on room air.  CBC and chemistry largely within normal except for creatinine 1.03.  Head CT code stroke shows acute to subacute infarct in the left anterior basal ganglia involving lenticular nucleus and internal capsule.  Aspects is 8.  MRI of the brain schedule and patient being admitted to the hospital for further evaluation and treatment.  Review of Systems: As per HPI otherwise 10 point review of systems negative.    Past Medical History:  Diagnosis Date  . Anxiety and depression   . ASCUS with positive high risk HPV 09/2013  . Diabetes mellitus without complication ([place])    type 2  . Elevated blood sugar   . Family history of breast cancer in mother   . Fibroid uterus   . History of pulmonary embolus (PE)   . Hypertension   .  Menopause   . Mild dysplasia of cervix 09/2013   repeat pap done -ecc neg, cerival bx cin 1  . Retinal detachment     Past Surgical History:  Procedure Laterality Date  . anterior neck fusion    . BREAST CYST ASPIRATION Left yrs ago  . CHOLECYSTECTOMY  2010  . DILATION AND CURETTAGE OF UTERUS    . HYSTEROSCOPY    . mole removed  2014     reports that she has never smoked. She has never used smokeless tobacco. She reports that she does not drink alcohol or use drugs.  Allergies  Allergen Reactions  . Codeine Other (See Comments)    Pt went to sleep and had trouble waking up. Very sensitive to medication.  . Lmx 4 [Lidocaine]     Family History  Problem Relation Age of Onset  . Breast cancer Mother 94       reccurence age 71  . Heart disease Father   . Throat cancer Brother   . Heart disease Brother   . Lung cancer Brother        with mets to brain  . Breast cancer Maternal Aunt   . Colon cancer Neg Hx   . Ovarian cancer Neg Hx      Prior to Admission medications   Medication Sig Start Date End Date Taking? Authorizing Provider  amLODipine (NORVASC) 2.5 MG tablet TAKE 1 TABLET BY MOUTH EVERY DAY  Patient not taking: Reported on 06/05/2019 02/01/19   Jerrol Banana., MD  Calcium Citrate-Vitamin D (CALCIUM + D PO) Take 1 tablet by mouth daily.    [provider]  citalopram (CELEXA) 20 MG tablet TAKE 1/2 TABLET BY MOUTH DAILY 04/02/19   Jerrol Banana., MD  glucose blood (ONE TOUCH ULTRA TEST) test strip CHECK SUGAR ONCE DAILY 11/30/17   Jerrol Banana., MD  JARDIANCE 25 MG TABS tablet TAKE 1 TABLET BY MOUTH DAILY Patient not taking: Reported on 06/05/2019 10/03/18   Jerrol Banana., MD  meloxicam (MOBIC) 15 MG tablet TAKE 1 TABLET (15 MG TOTAL) BY MOUTH DAILY. AS NEEDED 01/25/20   Jerrol Banana., MD  metFORMIN (GLUCOPHAGE-XR) 500 MG 24 hr tablet TAKE 1 TABLET BY MOUTH EVERY DAY WITH BREAKFAST 05/29/19   Jerrol Banana., MD    Multiple Vitamins-Minerals (PRESERVISION/LUTEIN PO) Take by mouth daily.     [provider]  omeprazole (PRILOSEC) 20 MG capsule TAKE 1 CAPSULE BY MOUTH EVERY DAY 12/25/19   Jerrol Banana., MD  [facility] LANCETS FINE MISC Check sugar once daily 05/13/15   Jerrol Banana., MD  pioglitazone (ACTOS) 15 MG tablet TAKE 1 TABLET BY MOUTH EVERY DAY 02/06/20   Jerrol Banana., MD    Physical Exam: Vitals:   03/10/20 2145 03/10/20 2200 03/10/20 2215 03/10/20 2237  BP:  (!) 167/73  (!) 155/116  Pulse: 67 66 68 75  Resp: 18 20 (!) 22 16  Temp:    98.3 F (36.8 C)  TempSrc:    Oral  SpO2: 96% 96% 96% 97%  Weight:    73.5 kg  Height:    5\' 7"  (1.702 m)      Constitutional: Anxious, no acute distress Vitals:   03/10/20 2145 03/10/20 2200 03/10/20 2215 03/10/20 2237  BP:  (!) 167/73  (!) 155/116  Pulse: 67 66 68 75  Resp: 18 20 (!) 22 16  Temp:    98.3 F (36.8 C)  TempSrc:    Oral  SpO2: 96% 96% 96% 97%  Weight:    73.5 kg  Height:    5\' 7"  (1.702 m)   Eyes: PERRL, lids and conjunctivae normal ENMT: Mucous membranes are moist. Posterior pharynx clear of any exudate or lesions.Normal dentition.  Neck: normal, supple, no masses, no thyromegaly Respiratory: clear to auscultation bilaterally, no wheezing, no crackles. Normal respiratory effort. No accessory muscle use.  Cardiovascular: Regular rate and rhythm, no murmurs / rubs / gallops. No extremity edema. 2+ pedal pulses. No carotid bruits.  Abdomen: no tenderness, no masses palpated. No hepatosplenomegaly. Bowel sounds positive.  Musculoskeletal: no clubbing / cyanosis. No joint deformity upper and lower extremities. Good ROM, no contractures. Normal muscle tone.  Skin: no rashes, lesions, ulcers. No induration Neurologic: CN 2-12 grossly intact. Sensation intact, DTR normal. Strength 5/5 in all 4.  Moving all 4 limbs with only mild weakness in the lower extremities Psychiatric: Normal judgment and  insight. Alert and oriented x 3. Normal mood.     Labs on Admission: I have personally reviewed following labs and imaging studies  CBC: Recent Labs  Lab 03/10/20 1744  WBC 5.9  NEUTROABS 3.4  HGB 14.3  HCT 40.2  MCV 91.2  PLT 99991111   Basic Metabolic Panel: Recent Labs  Lab 03/10/20 1744  NA 141  K 3.7  CL 103  CO2 28  GLUCOSE 160*  BUN 9  CREATININE 1.03*  CALCIUM 9.5   GFR: Estimated Creatinine Clearance: 46.6 mL/min (A) (by C-G formula based on SCr of 1.03 mg/dL (H)). Liver Function Tests: Recent Labs  Lab 03/10/20 1744  AST 33  ALT 34  ALKPHOS 68  BILITOT 0.9  PROT 7.2  ALBUMIN 4.3   No results for input(s): LIPASE, AMYLASE in the last 168 hours. No results for input(s): AMMONIA in the last 168 hours. Coagulation Profile: Recent Labs  Lab 03/10/20 1744  INR 1.0   Cardiac Enzymes: No results for input(s): CKTOTAL, CKMB, CKMBINDEX, TROPONINI in the last 168 hours. BNP (last 3 results) No results for input(s): PROBNP in the last 8760 hours. HbA1C: No results for input(s): HGBA1C in the last 72 hours. CBG: Recent Labs  Lab 03/10/20 1742 03/10/20 2156  GLUCAP 152* 200*   Lipid Profile: No results for input(s): CHOL, HDL, LDLCALC, TRIG, CHOLHDL, LDLDIRECT in the last 72 hours. Thyroid Function Tests: No results for input(s): TSH, T4TOTAL, FREET4, T3FREE, THYROIDAB in the last 72 hours. Anemia Panel: No results for input(s): VITAMINB12, FOLATE, FERRITIN, TIBC, IRON, RETICCTPCT in the last 72 hours. Urine analysis:    Component Value Date/Time   BILIRUBINUR neg 06/05/2019 0938   PROTEINUR Negative 06/05/2019 0938   UROBILINOGEN 0.2 06/05/2019 0938   NITRITE neg 06/05/2019 0938   LEUKOCYTESUR Negative 06/05/2019 0938   Sepsis Labs: @LABRCNTIP (procalcitonin:4,lacticidven:4) )No results found for this or any previous visit (from the past 240 hour(s)).   Radiological Exams on Admission: CT HEAD CODE STROKE WO CONTRAST  Result Date:  03/10/2020 CLINICAL DATA:  Code stroke. Acute neuro deficit. Altered mental status. EXAM: CT HEAD WITHOUT CONTRAST TECHNIQUE: Contiguous axial images were obtained from the base of the skull through the vertex without intravenous contrast. COMPARISON:  None. FINDINGS: Brain: Hypodensity in the left anterior basal ganglia compatible with acute or subacute infarct. No acute hemorrhage. No other acute infarct or mass. Ventricle size is normal. Vascular: Negative for hyperdense vessel Skull: Negative Sinuses/Orbits: Paranasal sinuses clear. Scleral buckle on the right eye with gas in the posterior chamber on the right presumably related to recent surgery. Other: None ASPECTS ([place] Stroke Program Early CT Score) - Ganglionic level infarction (caudate, lentiform nuclei, internal capsule, insula, M1-M3 cortex): 5 - Supraganglionic infarction (M4-M6 cortex): 3 Total score (0-10 with 10 being normal): 8 IMPRESSION: 1. Acute/subacute infarct left anterior basal ganglia involving lenticular nuclei and internal capsule. No acute hemorrhage 2. ASPECTS is 8 3. These results were called by telephone at the time of interpretation on 03/10/2020 at 6:00 pm to provider [facility] , who verbally acknowledged these results. Electronically Signed   By: Franchot Gallo M.D.   On: 03/10/2020 18:00    EKG: Independently reviewed.  Sinus tachycardia no significant findings  Assessment/Plan Principal Problem:   CVA (cerebral vascular accident) ([place]) Active Problems:   Anxiety   Diabetes mellitus type 2 without retinopathy ([place])   Hypertension     #1 acute to subacute CVA: Based on CT findings.  Patient is improving now.  Admit the patient and initiate aspirin, Plavix, statin.  PT OT and speech therapy evaluation.  MRI of the brain carotid Dopplers and echocardiogram will be performed.  Neurology consultation especially after the results.  #2 diabetes: Sliding scale insulin will be continued with.  #3 hypertension: We  will confirm and resume home regimen if she passes swallow eval.  Also if acute CVA we may do permissive hypertension.  #4 anxiety disorder: Counseling provided and patient will continue  on home regimen.   DVT prophylaxis: Lovenox Code Status: Full code Family Communication: Husband Disposition Plan: Home Consults called: Teleneurology at the moment Admission status: Inpatient  Severity of Illness: The appropriate patient status for this patient is INPATIENT. Inpatient status is judged to be reasonable and necessary in order to provide the required intensity of service to ensure the patient's safety. The patient's presenting symptoms, physical exam findings, and initial radiographic and laboratory data in the context of their chronic comorbidities is felt to place them at high risk for further clinical deterioration. Furthermore, it is not anticipated that the patient will be medically stable for discharge from the hospital within 2 midnights of admission. The following factors support the patient status of inpatient.   " The patient's presenting symptoms include speech changes and confusion. " The worrisome physical exam findings include mild weakness lower extremity. " The initial radiographic and laboratory data are worrisome because of head CT showing possible acute to subacute stroke. " The chronic co-morbidities include hypertension diabetes.   * I certify that at the point of admission it is my clinical judgment that the patient will require inpatient hospital care spanning beyond 2 midnights from the point of admission due to high intensity of service, high risk for further deterioration and high frequency of surveillance required.Barbette Merino MD [facility] Pager [phone]  If 7PM-7AM, please contact night-coverage [email or web address] Password [facility]  03/10/2020, 10:53 PM

## 2020-03-10 NOTE — ED Notes (Signed)
This RN almost done with initial NIH when neuro came up on computer. This RN remains at bedside.

## 2020-03-10 NOTE — ED Notes (Signed)
Secretary notified to call code stroke per EDP Jessup.

## 2020-03-10 NOTE — ED Notes (Signed)
This RN at bedside with neuro cart. Neuro currently talking with pt and her husband. Per Ramer, CT back and positive for stroke. EDP Jessup ordering CT angio.

## 2020-03-11 ENCOUNTER — Telehealth: Payer: Self-pay | Admitting: Family Medicine

## 2020-03-11 ENCOUNTER — Other Ambulatory Visit: Payer: Self-pay | Admitting: Family Medicine

## 2020-03-11 ENCOUNTER — Inpatient Hospital Stay (HOSPITAL_COMMUNITY)
Admit: 2020-03-11 | Discharge: 2020-03-11 | Disposition: A | Discharge status: Home / Self Care | Payer: Medicare Other | Attending: Internal Medicine | Admitting: Internal Medicine

## 2020-03-11 DIAGNOSIS — E119 Type 2 diabetes mellitus without complications: Secondary | ICD-10-CM

## 2020-03-11 DIAGNOSIS — I6389 Other cerebral infarction: Secondary | ICD-10-CM

## 2020-03-11 LAB — GLUCOSE, CAPILLARY
Glucose-Capillary: 150 mg/dL — ABNORMAL HIGH (ref 70–99)
Glucose-Capillary: 154 mg/dL — ABNORMAL HIGH (ref 70–99)
Glucose-Capillary: 199 mg/dL — ABNORMAL HIGH (ref 70–99)
Glucose-Capillary: 176 mg/dL — ABNORMAL HIGH (ref 70–99)

## 2020-03-11 LAB — CBC
HCT: 37.5 % (ref 36.0–46.0)
Hemoglobin: 13.3 g/dL (ref 12.0–15.0)
MCH: 32.1 pg (ref 26.0–34.0)
MCHC: 35.5 g/dL (ref 30.0–36.0)
MCV: 90.6 fL (ref 80.0–100.0)
Platelets: 121 10*3/uL — ABNORMAL LOW (ref 150–400)
RBC: 4.14 MIL/uL (ref 3.87–5.11)
RDW: 12.2 % (ref 11.5–15.5)
WBC: 5.2 10*3/uL (ref 4.0–10.5)
nRBC: 0 % (ref 0.0–0.2)

## 2020-03-11 LAB — COMPREHENSIVE METABOLIC PANEL
ALT: 26 U/L (ref 0–44)
AST: 23 U/L (ref 15–41)
Albumin: 3.5 g/dL (ref 3.5–5.0)
Alkaline Phosphatase: 55 U/L (ref 38–126)
Anion gap: 10 (ref 5–15)
BUN: 9 mg/dL (ref 8–23)
CO2: 27 mmol/L (ref 22–32)
Calcium: 8.9 mg/dL (ref 8.9–10.3)
Chloride: 105 mmol/L (ref 98–111)
Creatinine, Ser: 0.53 mg/dL (ref 0.44–1.00)
GFR calc Af Amer: >60 mL/min (ref >60)
GFR calc non Af Amer: >60 mL/min (ref >60)
Glucose, Bld: 161 mg/dL — ABNORMAL HIGH (ref 70–99)
Potassium: 3.2 mmol/L — ABNORMAL LOW (ref 3.5–5.1)
Sodium: 142 mmol/L (ref 135–145)
Total Bilirubin: 1 mg/dL (ref 0.3–1.2)
Total Protein: 6 g/dL — ABNORMAL LOW (ref 6.5–8.1)

## 2020-03-11 LAB — LIPID PANEL
Cholesterol: 125 mg/dL (ref 0–200)
HDL: 42 mg/dL (ref >40)
LDL Cholesterol: 61 mg/dL (ref 0–99)
Total CHOL/HDL Ratio: 3 RATIO
Triglycerides: 112 mg/dL (ref <150)
VLDL: 22 mg/dL (ref 0–40)

## 2020-03-11 LAB — ECHOCARDIOGRAM COMPLETE
Height: 67 in
Weight: 2592.61 oz

## 2020-03-11 LAB — HEMOGLOBIN A1C
Hgb A1c MFr Bld: 7.4 % — ABNORMAL HIGH (ref 4.8–5.6)
Mean Plasma Glucose: 165.68 mg/dL

## 2020-03-11 MED ORDER — ATORVASTATIN CALCIUM 20 MG PO TABS
40.0000 mg | ORAL_TABLET | Freq: Every day | ORAL | Status: DC
  Administered 2020-03-11 (×2): 40 mg via ORAL
  Filled 2020-03-11 (×2): qty 2

## 2020-03-11 MED ORDER — AMLODIPINE BESYLATE 5 MG PO TABS
2.5000 mg | ORAL_TABLET | Freq: Every day | ORAL | Status: DC
  Administered 2020-03-11 – 2020-03-12 (×2): 2.5 mg via ORAL
  Filled 2020-03-11 (×2): qty 1

## 2020-03-11 NOTE — Telephone Encounter (Signed)
Advise pt this is enough refill to last until her upcoming appt Requested Prescriptions  Pending Prescriptions Disp Refills  . pioglitazone (ACTOS) 15 MG tablet [Pharmacy Med Name: PIOGLITAZONE HCL 15 MG TABLET] 20 tablet 0    Sig: TAKE 1 TABLET BY MOUTH EVERY DAY     Endocrinology:  Diabetes - Glitazones - pioglitazone Passed - 03/11/2020 12:01 PM      Passed - HBA1C is between 0 and 7.9 and within 180 days    Hgb A1c MFr Bld  Date Value Ref Range Status  03/10/2020 7.4 (H) 4.8 - 5.6 % Final    Comment:    (NOTE) Pre diabetes:          5.7%-6.4% Diabetes:              >6.4% Glycemic control for   <7.0% adults with diabetes          Passed - Valid encounter within last 6 months    Recent Outpatient Visits          5 days ago Traumatic injury of head, initial encounter   [facility] Jerrol Banana., MD   7 months ago Diabetes mellitus type 2 without retinopathy [facility])   [facility] Jerrol Banana., MD   10 months ago Diabetes mellitus type 2 without retinopathy [facility])   [facility] Jerrol Banana., MD   1 year ago Essential hypertension   [facility] Jerrol Banana., MD   1 year ago Essential hypertension   [facility] Jerrol Banana., MD      Future Appointments            In 2 weeks Jerrol Banana., MD [facility]

## 2020-03-11 NOTE — Progress Notes (Signed)
SLP Cancellation Note  Patient Details Name: Anna Williams MRN: [id number] DOB: 13-Jul-1946   Cancelled treatment:       Reason Eval/Treat Not Completed: SLP screened, no needs identified, will sign off; Patient oriented x4, able to communicate at the conversation level, follow 2-3 step directions, complete semi-complex mental arithmetic calculations, & demonstrated no naming/repetition/automatic speech deficits. She was able to recall recent holidays and the current President of the [place]. Patient reports and improvement in her "cognition" and overall speech. She stated "I feel back to normal". Skilled ST services not required for cognitive-communication skills. Patient also passed [facility] Protocol recently administered by nursing staff. D/t passing YSP, SLP recommends MD to upgrade patient to Regular/Thin liquid diet as patient tolerates. SLP encouraged patient to notify MD/RN if changes in speech/language/cognition/swallowing occur during this hospitalization. Patient verbalized understanding and agreement. She was left reclined in bed with call bell in reach and all needs met prior to exiting room.  Loni Beckwith, M.S. CCC-SLP Speech-Language Pathologist  Loni Beckwith 03/11/2020, 11:09 AM

## 2020-03-11 NOTE — Progress Notes (Signed)
Patient admitted to room 108 from he ED for CVA. Patient's NIHSS is 0, there is no aphasia on assessment. Patient is oriented x4. Permissive HTN ongoing as recommended by neurologist. Patient oriented to staff, call bell use and white board updates. Patient denied pain or discomfort. Will endorse to oncoming RN.

## 2020-03-11 NOTE — Progress Notes (Signed)
PROGRESS NOTE    Anna Williams  [id number] DOB: 01/13/1946 DOA: 03/10/2020 PCP: Jerrol Banana., MD    Brief Narrative:  Anna Williams is a 74 y.o. female with medical history significant of anxiety disorder, diabetes, hypertension, hyperlipidemia, history of pulmonary embolism, who came to the ER with complaint of dysarthria today CT code stroke shows acute to subacute infarct in the left anterior basal ganglia involving lenticular nucleus and internal capsule   Consultants:   Neurology  Procedures:CT, MRI  Antimicrobials:       Subjective: This a.m. sitting in chair doing well.  No gargling speech.  Denies any dizziness, headache, shortness of breath or chest pain.  Husband at bedside.  Objective: Vitals:   03/11/20 0600 03/11/20 0739 03/11/20 0800 03/11/20 1206  BP: (!) 162/55 (!) 171/67 (!) 167/62 (!) 159/133  Pulse:  66  62  Resp: 16 16  17   Temp:  97.9 F (36.6 C)  98.5 F (36.9 C)  TempSrc:  Oral  Oral  SpO2: 100% 100%  98%  Weight:      Height:        Intake/Output Summary (Last 24 hours) at 03/11/2020 1321 Last data filed at 03/11/2020 0300 Gross per 24 hour  Intake 273.97 ml  Output --  Net 273.97 ml   Filed Weights   03/10/20 1735 03/10/20 2237  Weight: 72.6 kg 73.5 kg    Examination:  General exam: Appears calm and comfortable  Respiratory system: Clear to auscultation. Respiratory effort normal. Cardiovascular system: S1 & S2 heard, RRR. No JVD, murmurs, rubs, gallops or clicks. Gastrointestinal system: Abdomen is nondistended, soft and nontender.Normal bowel sounds heard. Central nervous system: Alert and oriented. Strength x4 5/5, speech clear, no facial droop, nose to finger test nml.  Extremities: No edema Skin: Warm dry Psychiatry: Judgement and insight appear normal. Mood & affect appropriate.     Data Reviewed: I have personally reviewed following labs and imaging studies  CBC: Recent Labs  Lab 03/10/20 1744  03/11/20 0519  WBC 5.9 5.2  NEUTROABS 3.4  --   HGB 14.3 13.3  HCT 40.2 37.5  MCV 91.2 90.6  PLT 155 [id number]*   Basic Metabolic Panel: Recent Labs  Lab 03/10/20 1744 03/11/20 0519  NA 141 142  K 3.7 3.2*  CL 103 105  CO2 28 27  GLUCOSE 160* 161*  BUN 9 9  CREATININE 1.03* 0.53  CALCIUM 9.5 8.9   GFR: Estimated Creatinine Clearance: 60 mL/min (by C-G formula based on SCr of 0.53 mg/dL). Liver Function Tests: Recent Labs  Lab 03/10/20 1744 03/11/20 0519  AST 33 23  ALT 34 26  ALKPHOS 68 55  BILITOT 0.9 1.0  PROT 7.2 6.0*  ALBUMIN 4.3 3.5   No results for input(s): LIPASE, AMYLASE in the last 168 hours. No results for input(s): AMMONIA in the last 168 hours. Coagulation Profile: Recent Labs  Lab 03/10/20 1744  INR 1.0   Cardiac Enzymes: No results for input(s): CKTOTAL, CKMB, CKMBINDEX, TROPONINI in the last 168 hours. BNP (last 3 results) No results for input(s): PROBNP in the last 8760 hours. HbA1C: Recent Labs    03/10/20 1744  HGBA1C 7.4*   CBG: Recent Labs  Lab 03/10/20 1742 03/10/20 2156 03/11/20 0737 03/11/20 1204  GLUCAP 152* 200* 150* 154*   Lipid Profile: Recent Labs    03/11/20 0519  CHOL 125  HDL 42  LDLCALC 61  TRIG 112  CHOLHDL 3.0   Thyroid Function Tests: No  results for input(s): TSH, T4TOTAL, FREET4, T3FREE, THYROIDAB in the last 72 hours. Anemia Panel: No results for input(s): VITAMINB12, FOLATE, FERRITIN, TIBC, IRON, RETICCTPCT in the last 72 hours. Sepsis Labs: No results for input(s): PROCALCITON, LATICACIDVEN in the last 168 hours.  No results found for this or any previous visit (from the past 240 hour(s)).       Radiology Studies: MR BRAIN WO CONTRAST  Result Date: 03/11/2020 CLINICAL DATA:  Speech difficulties EXAM: MRI HEAD WITHOUT CONTRAST TECHNIQUE: Multiplanar, multiecho pulse sequences of the brain and surrounding structures were obtained without intravenous contrast. COMPARISON:  None. FINDINGS: BRAIN:  There is an area of reduced diffusivity within the anterior left basal ganglia with corresponding hyperintense T2-weighted signal likely indicating a subacute infarct. No acute hemorrhage. Multifocal white matter hyperintensity, most commonly due to chronic ischemic microangiopathy. Normal volume of CSF spaces. No chronic microhemorrhage. Normal midline structures. VASCULAR: Major flow voids are preserved. SKULL AND UPPER CERVICAL SPINE: Normal calvarium and skull base. Visualized upper cervical spine and soft tissues are normal. SINUSES/ORBITS: No paranasal sinus fluid levels or advanced mucosal thickening. No mastoid or middle ear effusion. Normal orbits. IMPRESSION: Subacute infarct within the anterior left basal ganglia. No hemorrhage or mass effect. Electronically Signed   By: Ulyses Jarred M.D.   On: 03/11/2020 00:39   US Carotid Bilateral (at [facility] and [facility] only)  Result Date: 03/11/2020 CLINICAL DATA:  Dizziness and difficulty speaking EXAM: BILATERAL CAROTID DUPLEX ULTRASOUND TECHNIQUE: Pearline Cables scale imaging, color Doppler and duplex ultrasound were performed of bilateral carotid and vertebral arteries in the neck. COMPARISON:  None. FINDINGS: Criteria: Quantification of carotid stenosis is based on velocity parameters that correlate the residual internal carotid diameter with NASCET-based stenosis levels, using the diameter of the distal internal carotid lumen as the denominator for stenosis measurement. The following velocity measurements were obtained: RIGHT ICA: 58/14 cm/sec CCA: [id number] cm/sec SYSTOLIC ICA/CCA RATIO:  0.7 ECA: 71 cm/sec LEFT ICA: 92/22 cm/sec CCA: 0000000 cm/sec SYSTOLIC ICA/CCA RATIO:  2.2 ECA: 83 cm/sec RIGHT CAROTID ARTERY: Preliminary grayscale images demonstrate no significant atherosclerotic plaque or intimal thickening. The waveforms, velocities and flow velocity ratios show no evidence of focal hemodynamically significant stenosis. RIGHT VERTEBRAL ARTERY:  Antegrade in nature. LEFT  CAROTID ARTERY: Preliminary grayscale images demonstrate minimal atherosclerotic plaque in the region of the carotid bulb. The waveforms, velocities and flow velocity ratios however demonstrate no evidence of focal hemodynamically significant stenosis. LEFT VERTEBRAL ARTERY:  Antegrade in nature. IMPRESSION: No hemodynamically significant carotid stenosis is noted. Electronically Signed   By: Inez Catalina M.D.   On: 03/11/2020 02:19   CT HEAD CODE STROKE WO CONTRAST  Result Date: 03/10/2020 CLINICAL DATA:  Code stroke. Acute neuro deficit. Altered mental status. EXAM: CT HEAD WITHOUT CONTRAST TECHNIQUE: Contiguous axial images were obtained from the base of the skull through the vertex without intravenous contrast. COMPARISON:  None. FINDINGS: Brain: Hypodensity in the left anterior basal ganglia compatible with acute or subacute infarct. No acute hemorrhage. No other acute infarct or mass. Ventricle size is normal. Vascular: Negative for hyperdense vessel Skull: Negative Sinuses/Orbits: Paranasal sinuses clear. Scleral buckle on the right eye with gas in the posterior chamber on the right presumably related to recent surgery. Other: None ASPECTS ([place] Stroke Program Early CT Score) - Ganglionic level infarction (caudate, lentiform nuclei, internal capsule, insula, M1-M3 cortex): 5 - Supraganglionic infarction (M4-M6 cortex): 3 Total score (0-10 with 10 being normal): 8 IMPRESSION: 1. Acute/subacute infarct left anterior basal ganglia involving lenticular  nuclei and internal capsule. No acute hemorrhage 2. ASPECTS is 8 3. These results were called by telephone at the time of interpretation on 03/10/2020 at 6:00 pm to provider [facility] , who verbally acknowledged these results. Electronically Signed   By: Franchot Gallo M.D.   On: 03/10/2020 18:00        Scheduled Meds: . aspirin  300 mg Rectal Daily   Or  . aspirin EC  325 mg Oral Daily  . atorvastatin  40 mg Oral Daily  . clopidogrel  75 mg  Oral Daily  . enoxaparin (LOVENOX) injection  40 mg Subcutaneous Q24H  . insulin aspart  0-5 Units Subcutaneous QHS  . insulin aspart  0-9 Units Subcutaneous TID WC  . sodium chloride flush  3 mL Intravenous Once   Continuous Infusions: . sodium chloride 100 mL/hr at 03/11/20 1039    Assessment & Plan:   Principal Problem:   CVA (cerebral vascular accident) [facility]) Active Problems:   Anxiety   Diabetes mellitus type 2 without retinopathy ([place])   Hypertension   #1 acute to subacute CVA: Based on CT findings. MRI with subacute infarct within anterior left basal ganglia. No hemorrhage or mass effect. - Patient is improving now.  B/l carotid us-significant hemodynamically stenosis Echo pending Neuorlogy consulted, input was appreciated: dual antiplatelet therapy (ASA +plavix) x 21 days then single antiplatelet and high doses statin are recommended for stroke/TIA prophylaxis. -Permissive hypertension is recommended for 24 hours followed by a goal of normotension. -Stroke risk factor modification should include a goal of LDL <70, BP <130/80, a heart-healthy diet, evaluation for the possibility of sleep apnea, and at least 40 mins of cardiovascular exercise 5 days per week. Pt/OT pending   #2 diabetes: Sliding scale insulin will be continued with.  #3 hypertension: Also if acute CVA we may do permissive hypertension. With grandual improvement over the 24hrs. Will resume her home amlodipine dose 2.5mg  daily  #4 anxiety disorder:cotninue home meds  DVT prophylaxis: Lovenox Code Status: Full code Family Communication: Husband at bedside Disposition Plan: Home Barrier: Patient admitted with stroke still work-up pending.  If all goes well likely can DC in a.m. after PT and OT sees the patient      LOS: 1 day   Time spent: 45 minutes with more than 50% on [place], MD [facility] Pager [phone]  If 7PM-7AM, please contact  night-coverage [email or web address] Password [email or web address] 03/11/2020, 1:21 PM

## 2020-03-11 NOTE — Telephone Encounter (Signed)
[name]  Husband Herbie [place] wanted to make sure you knew that pt has been admitted to hospital and was diagnosed with having a stroke. He states they have been told that per hospital she will be released tomorrow and they will schedule f/u with you before she is released

## 2020-03-11 NOTE — Evaluation (Signed)
Occupational Therapy Evaluation Patient Details Name: Anna Williams MRN: [id number] DOB: 1946-09-01 Today's Date: 03/11/2020    History of Present Illness Anna Williams is a 73 y.o. female with medical history significant of anxiety disorder, diabetes, hypertension, hyperlipidemia, history of pulmonary embolism, who came to the ER with complaint of dysarthria today. CT showed acute to subacute infarct in the left anterior basal ganglia.   Clinical Impression   Pt was seen for OT evaluation this date. Prior to hospital admission, pt was Indep with all I/ADLs and fxl mobility. Pt lives in [place] with her husband with 2 STE and no railing. Currently pt demonstrates impairments as described below (See OT problem list) which functionally limit her ability to perform ADL/self-care tasks. Pt currently requires CGA to SBA with fxl mobility/ADL transfers (RW offered but pt does not appear to heavily use/rely on UE support) as well as safety awareness cues and demos ability to perform seated ADLs with setup to Indep and static standing ADLs with SBA w/ no AD.  [name] speech difficulties appear to be primarily resolved this date, but it appears pt might be a little impulsive or lack insight into fall risk as she was attempting to get to restroom I'ly when OT presented and floor was wet and lines/leads being tugged. Pt would benefit from skilled OT to address noted impairments and functional limitations (see below for any additional details) in order to maximize safety and independence while minimizing falls risk and caregiver burden. Upon hospital discharge, recommend HHOT to maximize pt safety and return to functional independence during meaningful occupations of daily life.     Follow Up Recommendations  Home health OT    Equipment Recommendations  None recommended by OT    Recommendations for Other Services       Precautions / Restrictions Precautions Precautions: Fall Restrictions Weight Bearing  Restrictions: No      Mobility Bed Mobility Overal bed mobility: Modified Independent                Transfers Overall transfer level: Needs assistance   Transfers: Sit to/from Stand Sit to Stand: Min guard;Supervision         General transfer comment: CGA initially on t/f for safety, progress to SBA with OT managing lines/leads and providing cues for safety.    Balance Overall balance assessment: Needs assistance Sitting-balance support: Feet supported Sitting balance-Leahy Scale: Normal     Standing balance support: Single extremity supported Standing balance-Leahy Scale: Fair Standing balance comment: benefits from 1 UE support, but does not require. G static standing, G-/F+ w/ fxl mobility w/o AD                           ADL either performed or assessed with clinical judgement   ADL Overall ADL's : Needs assistance/impaired Eating/Feeding: Independent   Grooming: Wash/dry hands;Wash/dry face;Oral care;Set up;Supervision/safety;Cueing for sequencing;Standing           Upper Body Dressing : Minimal assistance;Sitting Upper Body Dressing Details (indicate cue type and reason): MIN A to manage lines/leads relative to sleeves of hospital gown Lower Body Dressing: Modified independent;Sitting/lateral leans Lower Body Dressing Details (indicate cue type and reason): using cross leg technique in sitting to doff soiled socks and don clean ones. Toilet Transfer: Hydrologist;Ambulation;BSC Toilet Transfer Details (indicate cue type and reason): to [facility] in restroom with OT managing lines/leads and initially CGA with progress to SBA, demos G static standing balance, F with  fxl mobility (shuffles feet, states d/t concerns about stepping on lines) Toileting- Clothing Manipulation and Hygiene: Supervision/safety;Min guard;Sit to/from stand Toileting - Clothing Manipulation Details (indicate cue type and reason): CGA initaially with progress to SBA      Functional mobility during ADLs: Min guard;Supervision/safety;Rolling walker(RW given initially and pt does not really use or rely on AD. G fxl mobility balance with AD, G-/F+ w/o (seems slightly unsure of footing, but no overt LOB))       Vision Patient Visual Report: No change from baseline       Perception     Praxis      Pertinent Vitals/Pain Pain Assessment: No/denies pain     Hand Dominance     Extremity/Trunk Assessment Upper Extremity Assessment Upper Extremity Assessment: Overall WFL for tasks assessed   Lower Extremity Assessment Lower Extremity Assessment: Defer to PT evaluation;Overall [facility] for tasks assessed   Cervical / Trunk Assessment Cervical / Trunk Assessment: Normal   Communication Communication Communication: No difficulties;Other (comment)(pt with improved speech on assessment this date. Some delayed responsiveness as if anticiapting speech difficulties, but overall easy to understand and appropriate.)   Cognition Arousal/Alertness: Awake/alert Behavior During Therapy: WFL for tasks assessed/performed Overall Cognitive Status: Within Functional Limits for tasks assessed                                 General Comments: Pt is A&O and appropraite conversationally. Some potentially decreasd safety awareness. Pt was attempting to get to restroom I'ly when OT presented with IV and monitors on opposite side of bed about to be pulled off/out. OT facilitates education re: fall prevention and line/lead awareness. Pt with percieved good understanding.   General Comments       Exercises Other Exercises Other Exercises: OT facilitates education re: role of OT in acute setting as well as fall prevention strategies. Pt with good reception detected. BSC placed closer to patient for fall prevention.   Shoulder Instructions      Home Living Family/patient expects to be discharged to:: Private residence Living Arrangements: Spouse/significant  other Available Help at Discharge: Family Type of Home: House Home Access: Stairs to enter Technical brewer of Steps: 2 Entrance Stairs-Rails: None Home Layout: One level     Bathroom Shower/Tub: Occupational psychologist: [place]: Bedside commode;Shower seat          Prior Functioning/Environment Level of Independence: Independent        Comments: Pt reports that she was driving, performed all household IADLs/ADLs I'ly. Reports that she performed fxl mobility with no AD for [facility] or in community.        OT Problem List: Decreased strength;Decreased activity tolerance;Impaired balance (sitting and/or standing);Decreased safety awareness      OT Treatment/Interventions: Self-care/ADL training;Therapeutic exercise;Patient/family education;Balance training;Therapeutic activities;DME and/or AE instruction    OT Goals(Current goals can be found in the care plan section) Acute Rehab OT Goals Patient Stated Goal: to get these wires off and go home OT Goal Formulation: With patient Time For Goal Achievement: 03/25/20 Potential to Achieve Goals: Good  OT Frequency: Min 2X/week   Barriers to D/C:            Co-evaluation              AM-PAC OT "6 Clicks" Daily Activity     Outcome Measure Help from another person eating meals?: None Help from another  person taking care of personal grooming?: A Little Help from another person toileting, which includes using toliet, bedpan, or urinal?: A Little Help from another person bathing (including washing, rinsing, drying)?: A Little Help from another person to put on and taking off regular upper body clothing?: None Help from another person to put on and taking off regular lower body clothing?: A Little 6 Click Score: 20   End of Session Equipment Utilized During Treatment: Gait belt;Rolling walker Nurse Communication: Mobility status  Activity Tolerance: Patient tolerated treatment well  Patient left: in chair;with call bell/phone within reach;with chair alarm set;Other (comment)(BSC placed close to pt for fall prevention)  OT Visit Diagnosis: Unsteadiness on feet (R26.81)                Time: [id number] OT Time Calculation (min): 39 min Charges:  OT General Charges $OT Visit: 1 Visit OT Evaluation $OT Eval Low Complexity: 1 Low OT Treatments $Self Care/Home Management : 8-22 mins $Therapeutic Activity: 8-22 mins  Gerrianne Scale, MS, OTR/L ascom [phone] 03/11/20, 3:53 PM

## 2020-03-11 NOTE — Progress Notes (Signed)
PT Cancellation Note  Patient Details Name: Anna Williams MRN: [id number] DOB: 01-24-1946   Cancelled Treatment:    Reason Eval/Treat Not Completed: Other (comment).  PT consult received.  Chart reviewed.  Pt laying in bed talking on phone upon PT arrival and pt requesting therapist return later.  Therapist let pt know that she would not be able to return later today.  Pt requesting therapist return tomorrow instead.  Discussed with pt's nurse who reports pt not discharging today.  Will re-attempt PT evaluation tomorrow.  Leitha Bleak, PT 03/11/20, 3:41 PM

## 2020-03-11 NOTE — Progress Notes (Signed)
*  PRELIMINARY RESULTS* Echocardiogram 2D Echocardiogram has been performed.  Anna Williams 03/11/2020, 11:56 AM

## 2020-03-12 DIAGNOSIS — I639 Cerebral infarction, unspecified: Principal | ICD-10-CM

## 2020-03-12 LAB — SARS CORONAVIRUS 2 (TAT 6-24 HRS): SARS Coronavirus 2: NEGATIVE

## 2020-03-12 LAB — GLUCOSE, CAPILLARY
Glucose-Capillary: 188 mg/dL — ABNORMAL HIGH (ref 70–99)
Glucose-Capillary: 273 mg/dL — ABNORMAL HIGH (ref 70–99)

## 2020-03-12 MED ORDER — CLOPIDOGREL BISULFATE 75 MG PO TABS: 75.0000 mg | ORAL_TABLET | Freq: Every day | ORAL | 0 refills | Status: AC

## 2020-03-12 MED ORDER — ATORVASTATIN CALCIUM 40 MG PO TABS: 40.0000 mg | ORAL_TABLET | Freq: Every day | ORAL | 0 refills | Status: DC

## 2020-03-12 MED ORDER — ASPIRIN EC 81 MG PO TBEC
81.0000 mg | DELAYED_RELEASE_TABLET | Freq: Every day | ORAL | 0 refills | Status: AC
Start: 2020-03-12 — End: 2020-04-11

## 2020-03-12 NOTE — Evaluation (Signed)
Physical Therapy Evaluation Patient Details Name: Anna Williams MRN: [id number] DOB: 1946-09-24 Today's Date: 03/12/2020   History of Present Illness  Pt is a 74 y.o. female presenting to hospital 5/31 with difficulty speaking.  Of note, pt fell backwards about 1 month ago striking head and has had intermittent HA's and cognitive difficulties since; severe HA 2 weeks ago with difficulty finishing sentences and reading menus.  Imaging showing subacute infarct within anterior L basal ganglia.  PMH includes htn, DM, PE, anxiety, depression, retinal detachment, h/o anterior neck fusion.  Clinical Impression  Prior to hospital admission, pt was independent; lives with her husband.  Currently pt is modified independent with bed mobility; independent with transfers; SBA with ambulation 240 feet (no AD); and CGA navigating 12 stairs (see below for details).  Intact B LE light touch, proprioception, heel to shin coordination, and tone.  R LE knee flexion/extension and DF 4+/5.  No loss of balance with dynamic sitting or standing activities.  Pt would benefit from skilled PT to address noted impairments and functional limitations during hospitalization (see below for any additional details).  Upon hospital discharge, no further PT needs anticipated.    Follow Up Recommendations No PT follow up    Equipment Recommendations  None recommended by PT    Recommendations for Other Services       Precautions / Restrictions Precautions Precautions: Fall Restrictions Weight Bearing Restrictions: No      Mobility  Bed Mobility Overal bed mobility: Modified Independent             General bed mobility comments: supine to/from sit without any noted difficulties  Transfers Overall transfer level: Independent Equipment used: None Transfers: Sit to/from [employer] to Stand: Independent Stand pivot transfers: Independent(to/from BSC)       General transfer comment: steady  safe transfers noted  Ambulation/Gait Ambulation/Gait assistance: Supervision Gait Distance (Feet): 240 Feet Assistive device: None Gait Pattern/deviations: WFL(Within Functional Limits) Gait velocity: mildly decreased   General Gait Details: steady ambulation  Stairs Stairs: Yes Stairs assistance: Modified independent (Device/Increase time) Stair Management: One rail Right;No rails Number of Stairs: 12 General stair comments: ascended/descended 10 steps with R railing (alternating pattern) and 2 steps no railing (step to pattern--appearing more cautious without use of railing)  Wheelchair Mobility    Modified Rankin (Stroke Patients Only)       Balance Overall balance assessment: Needs assistance Sitting-balance support: No upper extremity supported;Feet supported Sitting balance-Leahy Scale: Normal Sitting balance - Comments: steady sitting reaching outside BOS   Standing balance support: No upper extremity supported;During functional activity Standing balance-Leahy Scale: Normal Standing balance comment: no loss of balance with ambulation and head turns R/L/up/down, increasing/decreasing speed, and turning 180 degrees and stopping                             Pertinent Vitals/Pain Pain Assessment: No/denies pain  HR 64-81 bpm during sessions activities. O2 sats WFL on room air during sessions activities.    Home Living Family/patient expects to be discharged to:: Private residence Living Arrangements: Spouse/significant other Available Help at Discharge: Family Type of Home: House Home Access: Stairs to enter Entrance Stairs-Rails: None Entrance Stairs-Number of Steps: 2 Home Layout: Two level;Bed/bath upstairs        Prior Function Level of Independence: Independent         Comments: (+) driving; Independent ADL's     Hand Dominance  Extremity/Trunk Assessment   Upper Extremity Assessment Upper Extremity Assessment: Overall WFL  for tasks assessed    Lower Extremity Assessment Lower Extremity Assessment: Overall WFL for tasks assessed;RLE deficits/detail(Intact B LE heel to shin coordination, tone, light touch, and proprioception.) RLE Deficits / Details: hip flexion 5/5; knee extension 4+/5; knee flexion 4+/5; DF 4+/5    Cervical / Trunk Assessment Cervical / Trunk Assessment: Normal  Communication   Communication: (occasional delayed responses but answers all appearing appropriate)  Cognition Arousal/Alertness: Awake/alert Behavior During Therapy: WFL for tasks assessed/performed Overall Cognitive Status: Within Functional Limits for tasks assessed                                        General Comments   Nursing cleared pt for participation in physical therapy.  Pt agreeable to PT session.  [name] husband present during session.    Exercises     Assessment/Plan    PT Assessment Patient needs continued PT services  PT Problem List Decreased strength;Decreased mobility       PT Treatment Interventions DME instruction;Gait training;Stair training;Functional mobility training;Therapeutic activities;Therapeutic exercise;Balance training;Patient/family education    PT Goals (Current goals can be found in the Care Plan section)  Acute Rehab PT Goals Patient Stated Goal: to go home PT Goal Formulation: With patient Time For Goal Achievement: 03/26/20 Potential to Achieve Goals: Good    Frequency 7X/week   Barriers to discharge        Co-evaluation               AM-PAC PT "6 Clicks" Mobility  Outcome Measure Help needed turning from your back to your side while in a flat bed without using bedrails?: None Help needed moving from lying on your back to sitting on the side of a flat bed without using bedrails?: None Help needed moving to and from a bed to a chair (including a wheelchair)?: None Help needed standing up from a chair using your arms (e.g., wheelchair or bedside  chair)?: None Help needed to walk in hospital room?: A Little Help needed climbing 3-5 steps with a railing? : A Little 6 Click Score: 22    End of Session Equipment Utilized During Treatment: Gait belt Activity Tolerance: Patient tolerated treatment well Patient left: in bed;with call bell/phone within reach;with bed alarm set Nurse Communication: Mobility status;Precautions PT Visit Diagnosis: History of falling (Z91.81);Other abnormalities of gait and mobility (R26.89);Hemiplegia and hemiparesis Hemiplegia - Right/Left: Right Hemiplegia - caused by: Cerebral infarction    Time: [id number] PT Time Calculation (min) (ACUTE ONLY): 44 min   Charges:   PT Evaluation $PT Eval Low Complexity: 1 Low PT Treatments $Therapeutic Activity: 8-22 mins       Leitha Bleak, PT 03/12/20, 12:17 PM

## 2020-03-12 NOTE — Consult Note (Signed)
Reason for Consult: dizziness  Requesting Physician: Dr. Roosevelt Locks   CC: dizziness   HPI: Anna Williams is an 74 y.o. female  with medical history significant of anxiety disorder, diabetes, hypertension, hyperlipidemia, history of pulmonary embolism, who came to the ER with complaint of dysarthria. Symptoms started two weeks ago with headache and dysarthria.  Yesterday however she had an episode where she felt weak all over but especially in the lower extremities and had difficulty speaking.  Currently back to baseline.   Past Medical History:  Diagnosis Date  . Anxiety and depression   . ASCUS with positive high risk HPV 09/2013  . Diabetes mellitus without complication ([place])    type 2  . Elevated blood sugar   . Family history of breast cancer in mother   . Fibroid uterus   . History of pulmonary embolus (PE)   . Hypertension   . Menopause   . Mild dysplasia of cervix 09/2013   repeat pap done -ecc neg, cerival bx cin 1  . Retinal detachment     Past Surgical History:  Procedure Laterality Date  . anterior neck fusion    . BREAST CYST ASPIRATION Left yrs ago  . CHOLECYSTECTOMY  2010  . DILATION AND CURETTAGE OF UTERUS    . HYSTEROSCOPY    . mole removed  2014    Family History  Problem Relation Age of Onset  . Breast cancer Mother 48       reccurence age 68  . Heart disease Father   . Throat cancer Brother   . Heart disease Brother   . Lung cancer Brother        with mets to brain  . Breast cancer Maternal Aunt   . Colon cancer Neg Hx   . Ovarian cancer Neg Hx     Social History:  reports that she has never smoked. She has never used smokeless tobacco. She reports that she does not drink alcohol or use drugs.  Allergies  Allergen Reactions  . Codeine Other (See Comments)    Pt went to sleep and had trouble waking up. Very sensitive to medication.  . Lmx 4 [Lidocaine]     Medications: I have reviewed the patient's current medications.  ROS: History obtained  from the patient  General ROS: negative for - chills, fatigue, fever, night sweats, weight gain or weight loss Psychological ROS: negative for - behavioral disorder, hallucinations, memory difficulties, mood swings or suicidal ideation Ophthalmic ROS: negative for - blurry vision, double vision, eye pain or loss of vision ENT ROS: negative for - epistaxis, nasal discharge, oral lesions, sore throat, tinnitus or vertigo Allergy and Immunology ROS: negative for - hives or itchy/watery eyes Hematological and Lymphatic ROS: negative for - bleeding problems, bruising or swollen lymph nodes Endocrine ROS: negative for - galactorrhea, hair pattern changes, polydipsia/polyuria or temperature intolerance Respiratory ROS: negative for - cough, hemoptysis, shortness of breath or wheezing Cardiovascular ROS: negative for - chest pain, dyspnea on exertion, edema or irregular heartbeat Gastrointestinal ROS: negative for - abdominal pain, diarrhea, hematemesis, nausea/vomiting or stool incontinence Genito-Urinary ROS: negative for - dysuria, hematuria, incontinence or urinary frequency/urgency Musculoskeletal ROS: negative for - joint swelling or muscular weakness Neurological ROS: as noted in HPI Dermatological ROS: negative for rash and skin lesion changes  Physical Examination: Blood pressure (!) 114/56, pulse 66, temperature 97.6 F (36.4 C), temperature source Oral, resp. rate 17, height 5\' 7"  (1.702 m), weight 73.5 kg, SpO2 100 %.   Neurological  Examination   Mental Status: Alert, oriented, thought content appropriate.  Speech fluent without evidence of aphasia.  Able to follow 3 step commands without difficulty. Cranial Nerves: II: Discs flat bilaterally; Visual fields grossly normal, pupils equal, round, reactive to light and accommodation III,IV, VI: ptosis not present, extra-ocular motions intact bilaterally V,VII: smile symmetric, facial light touch sensation normal bilaterally VIII: hearing  normal bilaterally IX,X: gag reflex present XI: bilateral shoulder shrug XII: midline tongue extension Motor: Right : Upper extremity   5/5    Left:     Upper extremity   5/5  Lower extremity   5/5     Lower extremity   5/5 Tone and bulk:normal tone throughout; no atrophy noted Sensory: Pinprick and light touch intact throughout, bilaterally Deep Tendon Reflexes: 2+ and symmetric throughout Plantars: Right: downgoing   Left: downgoing Cerebellar: normal finger-to-nose, normal rapid alternating movements and normal heel-to-shin test Gait: not tested    Laboratory Studies:   Basic Metabolic Panel: Recent Labs  Lab 03/10/20 1744 03/11/20 0519  NA 141 142  K 3.7 3.2*  CL 103 105  CO2 28 27  GLUCOSE 160* 161*  BUN 9 9  CREATININE 1.03* 0.53  CALCIUM 9.5 8.9    Liver Function Tests: Recent Labs  Lab 03/10/20 1744 03/11/20 0519  AST 33 23  ALT 34 26  ALKPHOS 68 55  BILITOT 0.9 1.0  PROT 7.2 6.0*  ALBUMIN 4.3 3.5   No results for input(s): LIPASE, AMYLASE in the last 168 hours. No results for input(s): AMMONIA in the last 168 hours.  CBC: Recent Labs  Lab 03/10/20 1744 03/11/20 0519  WBC 5.9 5.2  NEUTROABS 3.4  --   HGB 14.3 13.3  HCT 40.2 37.5  MCV 91.2 90.6  PLT 155 121*    Cardiac Enzymes: No results for input(s): CKTOTAL, CKMB, CKMBINDEX, TROPONINI in the last 168 hours.  BNP: Invalid input(s): POCBNP  CBG: Recent Labs  Lab 03/11/20 0737 03/11/20 1204 03/11/20 1709 03/11/20 2058 03/12/20 0744  GLUCAP 150* 154* 199* 176* 188*    Microbiology: Results for orders placed or performed during the hospital encounter of 03/10/20  SARS CORONAVIRUS 2 (TAT 6-24 HRS) Nasopharyngeal Nasopharyngeal Swab     Status: None   Collection Time: 03/11/20 11:35 PM   Specimen: Nasopharyngeal Swab  Result Value Ref Range Status   SARS Coronavirus 2 NEGATIVE NEGATIVE Final    Comment: (NOTE) SARS-CoV-2 target nucleic acids are NOT DETECTED. The SARS-CoV-2  RNA is generally detectable in upper and lower respiratory specimens during the acute phase of infection. Negative results do not preclude SARS-CoV-2 infection, do not rule out co-infections with other pathogens, and should not be used as the sole basis for treatment or other patient management decisions. Negative results must be combined with clinical observations, patient history, and epidemiological information. The expected result is Negative. Fact Sheet for Patients: SugarRoll.be Fact Sheet for Healthcare Providers: https://www.woods-mathews.com/ This test is not yet approved or cleared by the [place] FDA and  has been authorized for detection and/or diagnosis of SARS-CoV-2 by FDA under an Emergency Use Authorization (EUA). This EUA will remain  in effect (meaning this test can be used) for the duration of the COVID-19 declaration under Section 56 4(b)(1) of the Act, 21 U.S.C. section 360bbb-3(b)(1), unless the authorization is terminated or revoked sooner. Performed at [facility], [place] [address] [place], [place] [address]     Coagulation Studies: Recent Labs    03/10/20 1744  LABPROT  13.2  INR 1.0    Urinalysis: No results for input(s): COLORURINE, LABSPEC, PHURINE, GLUCOSEU, HGBUR, BILIRUBINUR, KETONESUR, PROTEINUR, UROBILINOGEN, NITRITE, LEUKOCYTESUR in the last 168 hours.  Invalid input(s): APPERANCEUR  Lipid Panel:     Component Value Date/Time   CHOL 125 03/11/2020 0519   CHOL 170 12/20/2017 1132   TRIG 112 03/11/2020 0519   HDL 42 03/11/2020 0519   HDL 51 12/20/2017 1132   CHOLHDL 3.0 03/11/2020 0519   VLDL 22 03/11/2020 0519   LDLCALC 61 03/11/2020 0519   LDLCALC 91 12/20/2017 1132    HgbA1C:  Lab Results  Component Value Date   HGBA1C 7.4 (H) 03/10/2020    Urine Drug Screen:  No results found for: LABOPIA, COCAINSCRNUR, LABBENZ, AMPHETMU, THCU, LABBARB  Alcohol Level: No results for input(s):  ETH in the last 168 hours.  Other results: EKG: normal EKG, normal sinus rhythm, unchanged from previous tracings.  Imaging: MR BRAIN WO CONTRAST  Result Date: 03/11/2020 CLINICAL DATA:  Speech difficulties EXAM: MRI HEAD WITHOUT CONTRAST TECHNIQUE: Multiplanar, multiecho pulse sequences of the brain and surrounding structures were obtained without intravenous contrast. COMPARISON:  None. FINDINGS: BRAIN: There is an area of reduced diffusivity within the anterior left basal ganglia with corresponding hyperintense T2-weighted signal likely indicating a subacute infarct. No acute hemorrhage. Multifocal white matter hyperintensity, most commonly due to chronic ischemic microangiopathy. Normal volume of CSF spaces. No chronic microhemorrhage. Normal midline structures. VASCULAR: Major flow voids are preserved. SKULL AND UPPER CERVICAL SPINE: Normal calvarium and skull base. Visualized upper cervical spine and soft tissues are normal. SINUSES/ORBITS: No paranasal sinus fluid levels or advanced mucosal thickening. No mastoid or middle ear effusion. Normal orbits. IMPRESSION: Subacute infarct within the anterior left basal ganglia. No hemorrhage or mass effect. Electronically Signed   By: Ulyses Jarred M.D.   On: 03/11/2020 00:39   US Carotid Bilateral (at [facility] and [facility] only)  Result Date: 03/11/2020 CLINICAL DATA:  Dizziness and difficulty speaking EXAM: BILATERAL CAROTID DUPLEX ULTRASOUND TECHNIQUE: Pearline Cables scale imaging, color Doppler and duplex ultrasound were performed of bilateral carotid and vertebral arteries in the neck. COMPARISON:  None. FINDINGS: Criteria: Quantification of carotid stenosis is based on velocity parameters that correlate the residual internal carotid diameter with NASCET-based stenosis levels, using the diameter of the distal internal carotid lumen as the denominator for stenosis measurement. The following velocity measurements were obtained: RIGHT ICA: 58/14 cm/sec CCA: [id number] cm/sec  SYSTOLIC ICA/CCA RATIO:  0.7 ECA: 71 cm/sec LEFT ICA: 92/22 cm/sec CCA: 0000000 cm/sec SYSTOLIC ICA/CCA RATIO:  2.2 ECA: 83 cm/sec RIGHT CAROTID ARTERY: Preliminary grayscale images demonstrate no significant atherosclerotic plaque or intimal thickening. The waveforms, velocities and flow velocity ratios show no evidence of focal hemodynamically significant stenosis. RIGHT VERTEBRAL ARTERY:  Antegrade in nature. LEFT CAROTID ARTERY: Preliminary grayscale images demonstrate minimal atherosclerotic plaque in the region of the carotid bulb. The waveforms, velocities and flow velocity ratios however demonstrate no evidence of focal hemodynamically significant stenosis. LEFT VERTEBRAL ARTERY:  Antegrade in nature. IMPRESSION: No hemodynamically significant carotid stenosis is noted. Electronically Signed   By: Inez Catalina M.D.   On: 03/11/2020 02:19   ECHOCARDIOGRAM COMPLETE  Result Date: 03/11/2020    ECHOCARDIOGRAM REPORT   Patient Name:   KELLIN LEMPKA Date of Exam: 03/11/2020 Medical Rec #:  [id number]        Height:       67.0 in Accession #:    [id number]       Weight:  162.0 lb Date of Birth:  August 30, 1946        BSA:          1.849 m Patient Age:    20 years         BP:           171/67 mmHg Patient Gender: F                HR:           66 bpm. Exam Location:  [facility] Procedure: 2D Echo, Color Doppler and Cardiac Doppler Indications:     I163.9 Stroke  History:         Patient has no prior history of Echocardiogram examinations.                  Risk Factors:Hypertension and Diabetes.  Sonographer:     Charmayne Sheer RDCS (AE) Referring Phys:  [place] Diagnosing Phys: Nelva Bush MD IMPRESSIONS  1. Left ventricular ejection fraction, by estimation, is 60 to 65%. The left ventricle has normal function. The left ventricle has no regional wall motion abnormalities. Left ventricular diastolic parameters are consistent with Grade I diastolic dysfunction (impaired relaxation).  2. Right ventricular  systolic function is normal. The right ventricular size is normal. Tricuspid regurgitation signal is inadequate for assessing PA pressure.  3. The mitral valve was not well visualized. Trivial mitral valve regurgitation. No evidence of mitral stenosis.  4. The aortic valve is tricuspid. Aortic valve regurgitation is not visualized. No aortic stenosis is present.  5. The inferior vena cava is normal in size with greater than 50% respiratory variability, suggesting right atrial pressure of 3 mmHg. FINDINGS  Left Ventricle: Left ventricular ejection fraction, by estimation, is 60 to 65%. The left ventricle has normal function. The left ventricle has no regional wall motion abnormalities. The left ventricular internal cavity size was normal in size. There is  no left ventricular hypertrophy. Left ventricular diastolic parameters are consistent with Grade I diastolic dysfunction (impaired relaxation). Right Ventricle: The right ventricular size is normal. No increase in right ventricular wall thickness. Right ventricular systolic function is normal. Tricuspid regurgitation signal is inadequate for assessing PA pressure. Left Atrium: Left atrial size was normal in size. Right Atrium: Right atrial size was normal in size. Pericardium: There is no evidence of pericardial effusion. Mitral Valve: The mitral valve was not well visualized. Trivial mitral valve regurgitation. No evidence of mitral valve stenosis. MV peak gradient, 3.6 mmHg. The mean mitral valve gradient is 1.0 mmHg. Tricuspid Valve: The tricuspid valve is not well visualized. Tricuspid valve regurgitation is not demonstrated. Aortic Valve: The aortic valve is tricuspid. Aortic valve regurgitation is not visualized. No aortic stenosis is present. Aortic valve mean gradient measures 2.0 mmHg. Aortic valve peak gradient measures 5.3 mmHg. Aortic valve area, by VTI measures 2.62 cm. Pulmonic Valve: The pulmonic valve was not well visualized. Pulmonic valve  regurgitation is trivial. No evidence of pulmonic stenosis. Aorta: The aortic root is normal in size and structure. Pulmonary Artery: The pulmonary artery is not well seen. Venous: The inferior vena cava is normal in size with greater than 50% respiratory variability, suggesting right atrial pressure of 3 mmHg. IAS/Shunts: The interatrial septum was not well visualized.  LEFT VENTRICLE PLAX 2D LVIDd:         3.85 cm  Diastology LVIDs:         2.63 cm  LV e' lateral:   6.74 cm/s LV PW:  0.94 cm  LV E/e' lateral: 10.7 LV IVS:        0.81 cm  LV e' medial:    6.85 cm/s LVOT diam:     2.10 cm  LV E/e' medial:  10.5 LV SV:         65 LV SV Index:   35 LVOT Area:     3.46 cm  RIGHT VENTRICLE RV Basal diam:  3.03 cm LEFT ATRIUM             Index       RIGHT ATRIUM          Index LA diam:        3.10 cm 1.68 cm/m  RA Area:     6.84 cm LA Vol (A2C):   31.3 ml 16.93 ml/m RA Volume:   10.60 ml 5.73 ml/m LA Vol (A4C):   29.1 ml 15.74 ml/m LA Biplane Vol: 30.1 ml 16.28 ml/m  AORTIC VALVE                   PULMONIC VALVE AV Area (Vmax):    2.49 cm    PV Vmax:       1.01 m/s AV Area (Vmean):   2.59 cm    PV Vmean:      59.500 cm/s AV Area (VTI):     2.62 cm    PV VTI:        0.190 m AV Vmax:           115.00 cm/s PV Peak grad:  4.1 mmHg AV Vmean:          71.900 cm/s PV Mean grad:  2.0 mmHg AV VTI:            0.247 m AV Peak Grad:      5.3 mmHg AV Mean Grad:      2.0 mmHg LVOT Vmax:         82.80 cm/s LVOT Vmean:        53.800 cm/s LVOT VTI:          0.187 m LVOT/AV VTI ratio: 0.76  AORTA Ao Root diam: 3.10 cm MITRAL VALVE MV Area (PHT): 3.17 cm    SHUNTS MV Peak grad:  3.6 mmHg    Systemic VTI:  0.19 m MV Mean grad:  1.0 mmHg    Systemic Diam: 2.10 cm MV Vmax:       0.96 m/s MV Vmean:      52.7 cm/s MV Decel Time: 239 msec MV E velocity: 71.80 cm/s MV A velocity: 94.60 cm/s MV E/A ratio:  0.76 Harrell Gave End MD Electronically signed by Nelva Bush MD Signature Date/Time: 03/11/2020/6:27:56 PM    Final    CT  HEAD CODE STROKE WO CONTRAST  Result Date: 03/10/2020 CLINICAL DATA:  Code stroke. Acute neuro deficit. Altered mental status. EXAM: CT HEAD WITHOUT CONTRAST TECHNIQUE: Contiguous axial images were obtained from the base of the skull through the vertex without intravenous contrast. COMPARISON:  None. FINDINGS: Brain: Hypodensity in the left anterior basal ganglia compatible with acute or subacute infarct. No acute hemorrhage. No other acute infarct or mass. Ventricle size is normal. Vascular: Negative for hyperdense vessel Skull: Negative Sinuses/Orbits: Paranasal sinuses clear. Scleral buckle on the right eye with gas in the posterior chamber on the right presumably related to recent surgery. Other: None ASPECTS ([place] Stroke Program Early CT Score) - Ganglionic level infarction (caudate, lentiform nuclei, internal capsule, insula, M1-M3 cortex): 5 - Supraganglionic infarction (M4-M6  cortex): 3 Total score (0-10 with 10 being normal): 8 IMPRESSION: 1. Acute/subacute infarct left anterior basal ganglia involving lenticular nuclei and internal capsule. No acute hemorrhage 2. ASPECTS is 8 3. These results were called by telephone at the time of interpretation on 03/10/2020 at 6:00 pm to provider [facility] , who verbally acknowledged these results. Electronically Signed   By: Franchot Gallo M.D.   On: 03/10/2020 18:00     Assessment/Plan:  74 y.o. female  with medical history significant of anxiety disorder, diabetes, hypertension, hyperlipidemia, history of pulmonary embolism, who came to the ER with complaint of dysarthria. Symptoms started two weeks ago with headache and dysarthria.  Yesterday however she had an episode where she felt weak all over but especially in the lower extremities and had difficulty speaking.  Currently back to baseline.   -  LG stroke  - stroke is likely subacute - agree with dual anti platelet therapy of ASA 81mg  and Plavix 75 daily on d/c - pt/ot - possible d/c planning  today 03/12/2020, 11:11 AM

## 2020-03-12 NOTE — Discharge Instructions (Signed)
#  1.  Heart healthy Diet. 2.  Increase exercise level. 3.  Follow-up with PCP in 1 week 4.  Follow-up with neurology in 2 weeks.

## 2020-03-12 NOTE — Progress Notes (Signed)
[name],acting as a scribe for Wilhemena Durie, MD.,have documented all relevant documentation on the behalf of Wilhemena Durie, MD,as directed by  Wilhemena Durie, MD while in the presence of Wilhemena Durie, MD.  Established patient visit   Patient: Anna Williams   DOB: 1946/07/28   74 y.o. Female  MRN: [id number] Visit Date: 03/13/2020  Today's healthcare provider: Wilhemena Durie, MD   Chief Complaint  Patient presents with  . Hospitalization Follow-up   Subjective    HPI Follow up Hospitalization She presented with mental status changes and speech difficulty.  This is all resolved.  Her husband states that her personality is back to normal now also which she had noticed. Patient was admitted to [facility] on 03/10/2020 and discharged on 03/12/2020. She was treated for Acute left basal ganglion stroke.. Treatment for this included; see notes in chart. Telephone follow up was done on none She reports excellent compliance with treatment. She reports this condition is improved.  ----------------------------------------------------------------------------------------- -   Social History   Tobacco Use  . Smoking status: Never Smoker  . Smokeless tobacco: Never Used  Substance Use Topics  . Alcohol use: No  . Drug use: No       Medications: Outpatient Medications Prior to Visit  Medication Sig  . Apoaequorin (PREVAGEN) 10 MG CAPS Take 10 mg by mouth daily at 12 noon.  Marland Kitchen aspirin EC 81 MG tablet Take 1 tablet (81 mg total) by mouth daily.  Marland Kitchen atorvastatin (LIPITOR) 40 MG tablet Take 1 tablet (40 mg total) by mouth daily.  . Calcium Citrate-Vitamin D (CALCIUM + D PO) Take 1 tablet by mouth daily.  . clopidogrel (PLAVIX) 75 MG tablet Take 1 tablet (75 mg total) by mouth daily for 21 days.  Marland Kitchen glucose blood (ONE TOUCH ULTRA TEST) test strip CHECK SUGAR ONCE DAILY  . metFORMIN (GLUCOPHAGE-XR) 500 MG 24 hr tablet TAKE 1 TABLET BY MOUTH EVERY DAY WITH  BREAKFAST (Patient taking differently: Take 500 mg by mouth daily with breakfast. )  . Multiple Vitamins-Minerals (PRESERVISION/LUTEIN PO) Take by mouth daily.   Marland Kitchen omeprazole (PRILOSEC) 20 MG capsule TAKE 1 CAPSULE BY MOUTH EVERY DAY (Patient taking differently: Take 20 mg by mouth daily. )  . ONETOUCH DELICA LANCETS FINE MISC Check sugar once daily  . pioglitazone (ACTOS) 15 MG tablet TAKE 1 TABLET BY MOUTH EVERY DAY  . amLODipine (NORVASC) 2.5 MG tablet TAKE 1 TABLET BY MOUTH EVERY DAY (Patient not taking: Reported on 06/05/2019)  . citalopram (CELEXA) 20 MG tablet TAKE 1/2 TABLET BY MOUTH DAILY (Patient not taking: No sig reported)  . JARDIANCE 25 MG TABS tablet TAKE 1 TABLET BY MOUTH DAILY (Patient not taking: Reported on 06/05/2019)   No facility-administered medications prior to visit.    Review of Systems  Constitutional: Negative for appetite change, chills, fatigue and fever.  HENT: Negative.   Eyes: Negative.   Respiratory: Negative for chest tightness and shortness of breath.   Cardiovascular: Negative for chest pain and palpitations.  Gastrointestinal: Negative for abdominal pain, nausea and vomiting.  Endocrine: Negative.   Genitourinary: Negative.   Musculoskeletal: Negative.   Allergic/Immunologic: Negative.   Neurological: Negative for dizziness and weakness.  Psychiatric/Behavioral: Negative.        Objective    BP 136/76 (BP Location: Right Arm, Patient Position: Sitting, Cuff Size: Normal)   Pulse 76   Temp (!) 97.3 F (36.3 C) (Temporal)   Ht 5\' 7"  (1.702 m)  Wt 158 lb 12.8 oz (72 kg)   BMI 24.87 kg/m  BP Readings from Last 3 Encounters:  03/13/20 136/76  03/12/20 (!) 114/56  03/06/20 (!) 150/79   Wt Readings from Last 3 Encounters:  03/13/20 158 lb 12.8 oz (72 kg)  03/10/20 162 lb 0.6 oz (73.5 kg)  03/06/20 161 lb 9.6 oz (73.3 kg)      Physical Exam Vitals reviewed.  Constitutional:      Appearance: She is well-developed.  HENT:     Head:  Normocephalic and atraumatic.     Right Ear: External ear normal.     Left Ear: External ear normal.     Nose: Nose normal.  Eyes:     General: No scleral icterus.    Conjunctiva/sclera: Conjunctivae normal.  Neck:     Thyroid: No thyromegaly.  Cardiovascular:     Rate and Rhythm: Normal rate and regular rhythm.     Heart sounds: Normal heart sounds.  Pulmonary:     Effort: Pulmonary effort is normal.     Breath sounds: Normal breath sounds.  Abdominal:     Palpations: Abdomen is soft.  Musculoskeletal:     Right lower leg: No edema.     Left lower leg: No edema.  Skin:    General: Skin is warm and dry.  Neurological:     General: No focal deficit present.     Mental Status: She is alert and oriented to person, place, and time.     Cranial Nerves: No cranial nerve deficit.     Sensory: No sensory deficit.     Coordination: Coordination normal.     Gait: Gait normal.  Psychiatric:        Mood and Affect: Mood normal.        Behavior: Behavior normal.        Thought Content: Thought content normal.        Judgment: Judgment normal.       No results found for any visits on 03/13/20.  Assessment & Plan     1. Cerebrovascular accident (CVA), unspecified mechanism ([place]) Patient has recovered.  She suffered a stroke in the anterior left basal ganglia with subacute infarct read on imaging. Patient now on aspirin, Plavix, and Lipitor despite LDL being 61 at the time of her stroke.  I think treatment is totally appropriate.  Refer to neurology for further management. - Ambulatory referral to Neurology - Ambulatory referral to Speech Therapy  2. Essential hypertension Controlled on amlodipine 2.5 mg daily.  Obtain yearly urine microalbumin  3. Diabetes mellitus type 2 without retinopathy ([place]) Last A1c was 8.4.  4. Menopause    No follow-ups on file.      I, Wilhemena Durie, MD, have reviewed all documentation for this visit. The documentation on 03/17/20 for the  exam, diagnosis, procedures, and orders are all accurate and complete.    Lilliana Turner Cranford Mon, MD  [facility] [phone] (phone) [phone] (fax)  [place]

## 2020-03-12 NOTE — Progress Notes (Signed)
Pt discharged via private vehicle. Discharge instructions explained and given to pt. No further questions at this time. No s/s of distress noted. IV and tele monitoring removed.  

## 2020-03-12 NOTE — Discharge Summary (Signed)
Physician Discharge Summary  Patient ID: Anna Williams MRN: [id number] DOB/AGE: [birth year] 74 y.o.  Admit date: 03/10/2020 Discharge date: 03/12/2020  Admission Diagnoses:  Discharge Diagnoses:  Principal Problem:   CVA (cerebral vascular accident) [facility]) Active Problems:   Anxiety   Diabetes mellitus type 2 without retinopathy ([place])   Hypertension  Acute left basal ganglion stroke.  Discharged Condition: good  Hospital Course:  Anna Garda Johnsonis a 74 y.o.femalewith medical history significant ofanxiety disorder, diabetes, hypertension, hyperlipidemia, history of pulmonary embolism, who came to the ER with complaint of dysarthria today CT code stroke shows acute to subacute infarct in the left anterior basal ganglia involving lenticular nucleus and internal capsule.  MRI of the brain confirmed diagnosis.  Carotid ultrasound did not show significant stenosis.  Echocardiogram showed normal ejection fraction 60 to 65%, no source of blood clots identified. Also seen by physical therapy and Occupational Therapy, she does not need any additional treatment.  Her dysarthria is also improving.  Patient was evaluated by telemetry neurology, and followed by neurology on the medical floor.  At this point, she will be continued on aspirin and Plavix, Lipitor.  She will be followed by neurology in 2 weeks, she will be also followed by PCP in 1 week.   Consults:  Neurology  Significant Diagnostic Studies: Echocardiogram showed ejection fraction 60 to 65%,   MRI HEAD WITHOUT CONTRAST  TECHNIQUE: Multiplanar, multiecho pulse sequences of the brain and surrounding structures were obtained without intravenous contrast.  COMPARISON:  None.  FINDINGS: BRAIN: There is an area of reduced diffusivity within the anterior left basal ganglia with corresponding hyperintense T2-weighted signal likely indicating a subacute infarct. No acute hemorrhage. Multifocal white matter hyperintensity,  most commonly due to chronic ischemic microangiopathy. Normal volume of CSF spaces. No chronic microhemorrhage. Normal midline structures.  VASCULAR: Major flow voids are preserved.  SKULL AND UPPER CERVICAL SPINE: Normal calvarium and skull base. Visualized upper cervical spine and soft tissues are normal.  SINUSES/ORBITS: No paranasal sinus fluid levels or advanced mucosal thickening. No mastoid or middle ear effusion. Normal orbits.  IMPRESSION: Subacute infarct within the anterior left basal ganglia. No hemorrhage or mass effect.   Electronically Signed   By: Ulyses Jarred M.D.   On: 03/11/2020 00:39  BILATERAL CAROTID DUPLEX ULTRASOUND  TECHNIQUE: Pearline Cables scale imaging, color Doppler and duplex ultrasound were performed of bilateral carotid and vertebral arteries in the neck.  COMPARISON:  None.  FINDINGS: Criteria: Quantification of carotid stenosis is based on velocity parameters that correlate the residual internal carotid diameter with NASCET-based stenosis levels, using the diameter of the distal internal carotid lumen as the denominator for stenosis measurement.  The following velocity measurements were obtained:  RIGHT  ICA: 58/14 cm/sec  CCA: [id number] cm/sec  SYSTOLIC ICA/CCA RATIO:  0.7  ECA: 71 cm/sec  LEFT  ICA: 92/22 cm/sec  CCA: 0000000 cm/sec  SYSTOLIC ICA/CCA RATIO:  2.2  ECA: 83 cm/sec  RIGHT CAROTID ARTERY: Preliminary grayscale images demonstrate no significant atherosclerotic plaque or intimal thickening. The waveforms, velocities and flow velocity ratios show no evidence of focal hemodynamically significant stenosis.  RIGHT VERTEBRAL ARTERY:  Antegrade in nature.  LEFT CAROTID ARTERY: Preliminary grayscale images demonstrate minimal atherosclerotic plaque in the region of the carotid bulb. The waveforms, velocities and flow velocity ratios however demonstrate no evidence of focal hemodynamically  significant stenosis.  LEFT VERTEBRAL ARTERY:  Antegrade in nature.  IMPRESSION: No hemodynamically significant carotid stenosis is noted.   Electronically Signed   By:  Inez Catalina M.D.   On: 03/11/2020 02:19  CT HEAD WITHOUT CONTRAST  TECHNIQUE: Contiguous axial images were obtained from the base of the skull through the vertex without intravenous contrast.  COMPARISON:  None.  FINDINGS: Brain: Hypodensity in the left anterior basal ganglia compatible with acute or subacute infarct. No acute hemorrhage. No other acute infarct or mass. Ventricle size is normal.  Vascular: Negative for hyperdense vessel  Skull: Negative  Sinuses/Orbits: Paranasal sinuses clear. Scleral buckle on the right eye with gas in the posterior chamber on the right presumably related to recent surgery.  Other: None  ASPECTS ([place] Stroke Program Early CT Score)  - Ganglionic level infarction (caudate, lentiform nuclei, internal capsule, insula, M1-M3 cortex): 5  - Supraganglionic infarction (M4-M6 cortex): 3  Total score (0-10 with 10 being normal): 8  IMPRESSION: 1. Acute/subacute infarct left anterior basal ganglia involving lenticular nuclei and internal capsule. No acute hemorrhage 2. ASPECTS is 8 3. These results were called by telephone at the time of interpretation on 03/10/2020 at 6:00 pm to provider [facility] , who verbally acknowledged these results.   Electronically Signed   By: Franchot Gallo M.D.   On: 03/10/2020 18:00   Treatments: Aspirin and Plavix.  Discharge Exam: Blood pressure (!) 114/56, pulse 66, temperature 97.6 F (36.4 C), temperature source Oral, resp. rate 17, height 5\' 7"  (1.702 m), weight 73.5 kg, SpO2 100 %. General appearance: alert and cooperative Resp: clear to auscultation bilaterally Cardio: regular rate and rhythm, S1, S2 normal, no murmur, click, rub or gallop GI: soft, non-tender; bowel sounds normal; no masses,   no organomegaly Extremities: extremities normal, atraumatic, no cyanosis or edema Neurologic: Alert and oriented X 3, normal strength and tone. Normal symmetric reflexes. Normal coordination and gait  Disposition: Discharge disposition: 01-Home or Self Care       Discharge Instructions    Diet - low sodium heart healthy   Complete by: As directed    Increase activity slowly   Complete by: As directed      Allergies as of 03/12/2020      Reactions   Codeine Other (See Comments)   Pt went to sleep and had trouble waking up. Very sensitive to medication.   Lmx 4 [lidocaine]       Medication List    STOP taking these medications   meloxicam 15 MG tablet Commonly known as: MOBIC     TAKE these medications   amLODipine 2.5 MG tablet Commonly known as: NORVASC TAKE 1 TABLET BY MOUTH EVERY DAY   aspirin EC 81 MG tablet Take 1 tablet (81 mg total) by mouth daily.   atorvastatin 40 MG tablet Commonly known as: LIPITOR Take 1 tablet (40 mg total) by mouth daily.   CALCIUM + D PO Take 1 tablet by mouth daily.   citalopram 20 MG tablet Commonly known as: CELEXA TAKE 1/2 TABLET BY MOUTH DAILY   clopidogrel 75 MG tablet Commonly known as: PLAVIX Take 1 tablet (75 mg total) by mouth daily for 21 days. Start taking on: March 13, 2020   glucose blood test strip Commonly known as: ONE TOUCH ULTRA TEST CHECK SUGAR ONCE DAILY   Jardiance 25 MG Tabs tablet Generic drug: empagliflozin TAKE 1 TABLET BY MOUTH DAILY   metFORMIN 500 MG 24 hr tablet Commonly known as: GLUCOPHAGE-XR TAKE 1 TABLET BY MOUTH EVERY DAY WITH BREAKFAST What changed: See the new instructions.   omeprazole 20 MG capsule Commonly known as: PRILOSEC TAKE 1  CAPSULE BY MOUTH EVERY DAY What changed: how much to take   OneTouch Delica Lancets Fine Misc Check sugar once daily   pioglitazone 15 MG tablet Commonly known as: ACTOS TAKE 1 TABLET BY MOUTH EVERY DAY   PRESERVISION/LUTEIN PO Take by mouth  daily.   Prevagen 10 MG Caps Generic drug: Apoaequorin Take 10 mg by mouth daily at 12 noon.      Follow-up Information    Jerrol Banana., MD Follow up in 1 week(s).   Specialty: Family Medicine Contact information: [address] [place] [address] [phone]        [facility] Follow up in 2 week(s).   Contact information: [place] [phone]         35 minutes  Signed: Sharen Hones 03/12/2020, 11:23 AM

## 2020-03-13 ENCOUNTER — Ambulatory Visit (INDEPENDENT_AMBULATORY_CARE_PROVIDER_SITE_OTHER): Payer: Medicare Other | Admitting: Family Medicine

## 2020-03-13 ENCOUNTER — Other Ambulatory Visit: Payer: Self-pay

## 2020-03-13 ENCOUNTER — Encounter: Payer: Self-pay | Admitting: Family Medicine

## 2020-03-13 VITALS — BP 136/76 | HR 76 | Temp 97.3°F | Ht 67.0 in | Wt 158.8 lb

## 2020-03-13 DIAGNOSIS — E119 Type 2 diabetes mellitus without complications: Secondary | ICD-10-CM

## 2020-03-13 DIAGNOSIS — I10 Essential (primary) hypertension: Secondary | ICD-10-CM

## 2020-03-13 DIAGNOSIS — I639 Cerebral infarction, unspecified: Secondary | ICD-10-CM | POA: Diagnosis not present

## 2020-03-13 DIAGNOSIS — Z780 Asymptomatic menopausal state: Secondary | ICD-10-CM

## 2020-03-19 ENCOUNTER — Other Ambulatory Visit: Payer: Medicare Other

## 2020-03-19 ENCOUNTER — Telehealth: Payer: Self-pay

## 2020-03-19 DIAGNOSIS — I639 Cerebral infarction, unspecified: Secondary | ICD-10-CM

## 2020-03-19 NOTE — Telephone Encounter (Signed)
Copied from [place] [id number]. Topic: Referral - Request for Referral >> Mar 19, 2020 10:48 AM Celene Kras wrote: Has patient seen PCP for this complaint? Yes.   *If NO, is insurance requiring patient see PCP for this issue before PCP can refer them? Referral for which specialty: Neurology  Preferred provider/office: N/A Reason for referral: Pts husband called stating that [facility] does not have any appts for pt until 05/06/20. He is requesting to have a referral placed somewhere else sooner. Please advise .

## 2020-03-19 NOTE — Telephone Encounter (Signed)
Referral placed to Dr. Melrose Nakayama at [facility].

## 2020-03-19 NOTE — Addendum Note (Signed)
Addended by: Gerald Stabs on: 03/19/2020 03:41 PM   Modules accepted: Orders

## 2020-03-19 NOTE — Telephone Encounter (Signed)
Okay to put in referral.  Thanks

## 2020-03-19 NOTE — Telephone Encounter (Signed)
Patient husband called to inform Dr Caryn Section that they found a Dr at [facility] Dr Melrose Nakayama where they were able to get an appointment on Monday 03/24/20 and need the referral for the Neurologist sent to that office please. Any questions please call Ph# [phone]

## 2020-03-20 ENCOUNTER — Ambulatory Visit: Payer: Medicare Other | Attending: Family Medicine | Admitting: Speech Pathology

## 2020-03-20 ENCOUNTER — Other Ambulatory Visit: Payer: Self-pay

## 2020-03-20 DIAGNOSIS — R41841 Cognitive communication deficit: Secondary | ICD-10-CM | POA: Diagnosis not present

## 2020-03-21 ENCOUNTER — Encounter: Payer: Self-pay | Admitting: Speech Pathology

## 2020-03-21 NOTE — Therapy (Signed)
[place] MAIN [facility] SERVICES [address] [place], [place], [address] Phone: [phone]   Fax:  [phone]  Speech Language Pathology Evaluation  Patient Details  Name: Anna Williams MRN: [id number] Date of Birth: 30-Jul-1946 Referring Provider (SLP): Miguel Aschoff   Encounter Date: 03/20/2020   End of Session - 03/21/20 1637    Visit Number 1    Number of Visits 25    Date for SLP Re-Evaluation 06/13/20    Authorization Type [employer]    Authorization Time Period 03/21/2020 to 06/13/2020    Authorization - Visit Number 1    Progress Note Due on Visit 10    SLP Start Time 1400    SLP Stop Time  1500    SLP Time Calculation (min) 60 min    Activity Tolerance Patient tolerated treatment well           Past Medical History:  Diagnosis Date  . Anxiety and depression   . ASCUS with positive high risk HPV 09/2013  . Diabetes mellitus without complication ([place])    type 2  . Elevated blood sugar   . Family history of breast cancer in mother   . Fibroid uterus   . History of pulmonary embolus (PE)   . Hypertension   . Menopause   . Mild dysplasia of cervix 09/2013   repeat pap done -ecc neg, cerival bx cin 1  . Retinal detachment     Past Surgical History:  Procedure Laterality Date  . anterior neck fusion    . BREAST CYST ASPIRATION Left yrs ago  . CHOLECYSTECTOMY  2010  . DILATION AND CURETTAGE OF UTERUS    . HYSTEROSCOPY    . mole removed  2014    There were no vitals filed for this visit.   Subjective Assessment - 03/21/20 1625    Subjective pt pleasant, she doesn't endorse any cognitive deficits    Patient is accompained by: Family member    Currently in Pain? No/denies              SLP Evaluation [facility] - 03/21/20 1625      SLP Visit Information   SLP Received On 03/20/20    Referring Provider (SLP) Miguel Aschoff    Onset Date 03/13/2020    Medical Diagnosis CVA      Subjective    Subjective pt pleasant, doesn't endorse any cognitive changes    Patient/Family Stated Goal to rule out any deficits      General Information   HPI Anna Williams is a 74 y.o. female with medical history significant of anxiety disorder, diabetes, hypertension, hyperlipidemia, history of pulmonary embolism, with recent infarct in the left anterior basal ganglia involving lenticular nucleus and internal capsule (03/10/2020). Per chart, pt exhibited dysarthria which cleared during acute hospitalization. Of note pt fell striking back of head on 03/06/2020 with diagnosis of concussion and altered mental status.        Prior Functional Status   Cognitive/Linguistic Baseline Within functional limits    Type of Home House     Lives With Spouse    Available Support Family    Vocation Part time employment      Pain Assessment   Pain Assessment No/denies pain      Cognition   Overall Cognitive Status Impaired/Different from baseline    Area of Impairment Attention;Memory;Awareness;Problem solving;Safety/judgement    Current Attention Level Sustained    Memory Decreased recall of precautions;Decreased short-term memory  Safety/Judgement Decreased awareness of deficits    Safety and Judgement Comments pt desires to drive, no awareness of current deficits    Awareness Intellectual    Awareness Comments dismissive of errors    Problem Solving Difficulty sequencing;Requires verbal cues;Requires tactile cues    Attention Selective    Selective Attention Impaired    Selective Attention Impairment Verbal complex;Functional complex    Memory Impaired    Memory Impairment --   working memory   Awareness Impaired    Awareness Impairment Emergent impairment    Problem Solving Impaired    Problem Solving Impairment Verbal complex;Functional complex    Corporate treasurer;Sequencing;Organizing;Decision Making;Self Monitoring;Self Correcting    Reasoning Impaired    Reasoning Impairment Verbal  complex;Functional complex    Sequencing Impaired    Sequencing Impairment Verbal complex;Functional complex    Organizing Impaired    Organizing Impairment Verbal complex;Functional complex    Decision Making Impaired    Decision Making Impairment Verbal complex;Functional complex    Self Monitoring Impaired    Self Monitoring Impairment Verbal complex;Functional complex    Self Correcting Impaired    Self Correcting Impairment Verbal complex;Functional complex    Behaviors Impulsive      Auditory Comprehension   Overall Auditory Comprehension Appears within functional limits for tasks assessed    Yes/No Questions Within Functional Limits    Commands Within Functional Limits    Conversation Simple    Other Conversation Comments she frequently deferred to husband    Interfering Components Attention    Overall Auditory Comprehension Comments Appeared within functional limits      Visual Recognition/Discrimination   Discrimination Within Function Limits      Reading Comprehension   Reading Status Not tested      Expression   Primary Mode of Expression Verbal      Verbal Expression   Overall Verbal Expression Appears within functional limits for tasks assessed    Initiation No impairment    Automatic Speech Name;Social Response    Level of Generative/Spontaneous Verbalization Conversation    Repetition No impairment    Naming No impairment    Pragmatics No impairment    Non-Verbal Means of Communication Not applicable      Written Expression   Dominant Hand Right    Written Expression Within Functional Limits    Overall Writen Expression Appears to be within functional limits      Oral Motor/Sensory Function   Overall Oral Motor/Sensory Function Appears within functional limits for tasks assessed    Labial ROM Within Functional Limits    Labial Symmetry Within Functional Limits    Labial Strength Within Functional Limits    Labial Sensation Within Functional Limits     Labial Coordination WFL    Lingual ROM Within Functional Limits    Lingual Symmetry Within Functional Limits    Lingual Strength Within Functional Limits    Lingual Sensation Within Functional Limits    Lingual Coordination WFL    Facial ROM Within Functional Limits    Facial Symmetry Within Functional Limits    Facial Strength Within Functional Limits    Facial Sensation Within Functional Limits    Facial Coordination WFL    Velum Within Functional Limits    Mandible Within Functional Limits    Overall Oral Motor/Sensory Function Appears to be within functional limits      Motor Speech   Overall Motor Speech Appears within functional limits for tasks assessed    Respiration Within functional limits  Phonation Normal    Resonance Within functional limits    Articulation Within functional limitis    Intelligibility Intelligible    Motor Planning Witnin functional limits    Motor Speech Errors Not applicable    Phonation WFL      Standardized Assessments   Standardized Assessments  Cognitive Linguistic Quick Test - see results below          Cognitive Linguistic Quick Test   The Cognitive Linguistic Quick Test (CLQT) was administered to assess the relative status of five cognitive domains: attention, memory, language, executive functioning, and visuospatial skills. Scores from 10 tasks were used to estimate severity ratings (for age groups 18-69 years and 70-89 years) for each domain, a clock drawing task, as well as an overall composite severity rating of cognition.        Task    Score  Criterion Cut Scores   Personal Facts     8/8   8    Symbol Cancellation     9/12   10   Confrontation Naming    10/10   10   Clock Drawing      8/13   11   Story Retelling       9/10   5   Symbol Trails      6/10   6   Generative Naming      2/9   4   Design Memory     2/6   4   Mazes        0/8   4   Design Generation    3/13   5   Cognitive Domain  Composite  Score Severity Rating   Attention   124/215  MILD    Memory   132/185  MILD   Executive Function  11/40   MODERATE   Language   29/37   WNL   Visuospatial Skills  41/105   MILD   Clock Drawing   8/13   MODERATE    Composite Severity Rating MILD     SLP Education - 03/21/20 1637    Education Details results of assessment    Person(s) Educated Patient;Spouse    Methods Explanation;Demonstration;Verbal cues    Comprehension Need further instruction            SLP Short Term Goals - 03/21/20 1642      SLP SHORT TERM GOAL #1   Title Given minimal to moderate cues, pt will sequence semi-complex reasoning problem solving tasks with 80% accuracy.    Baseline no ability for semi-complex reasoning tasks    Time 10    Period --   sessions   Status New      SLP SHORT TERM GOAL #2   Title With minimum to moderate cues, pt will pre-plan problem solving tasks with 80% accuracy.    Baseline unable, new concept    Time 10    Period --   sessions   Status New      SLP SHORT TERM GOAL #3   Title With minimum to moderate cues, pt will demonstrate emergent awareness by self-monitoring for errors during semi-complex problem solving tasks.    Baseline pt unable to self-monitor during evaluation    Time 10    Period --   sessions   Status New            SLP Long Term Goals - 03/21/20 1650      SLP LONG TERM GOAL #1  Title Pt will identify cognitive-communication barriers and participate in developing compensatory strategies.    Baseline new goal    Time 12    Period Weeks    Status New    Target Date 06/13/20      SLP LONG TERM GOAL #2   Title Pt will increase executive cognitive functions to complete complex reasoning tasks.    Baseline moderate deficits in executive functions    Time 10    Period --   sessions   Status New            Plan - 03/21/20 1639    Clinical Impression Statement Pt and her husband present today with referral for comprehensive cognitive  linguistic evaluation. Initially, pt's general conversation about recent trip to the beach appeared mildly disorganized and difficult to understand. The Cognitive Linguistic Quick Test was administered. While pt obtained an overall severity rating of MILD, her MODERATE deficits in executive function were very impairing. Deficits in pre-planning, organizing and sequencing tasks were present throughout all tasks. More concerning was pt's lack of awareness and dismissiveness towards her initial errors. However, as assessment continued pt demonstrated some initial awareness. While the results of this evaluation were not expected by pt and her husband, pt's sole focus was driving. Extensive education provided on the impact the above-mentioned deficits might have on safety when driving. Pt had a difficulty comprehending results of this evaluation. Emotional support provided by this therapist and her husband. Skilled ST is medically necessary to target the above-mentioned deficits to increase pt's functional independence, reduce safety risk and reduce caregiver burden.    Speech Therapy Frequency 2x / week    Duration --   12 weeks   Treatment/Interventions Cognitive reorganization;Patient/family education;SLP instruction and feedback    Potential to Achieve Goals Good    SLP Home Exercise Plan review calendar of scheduled ST sessions    Consulted and Agree with Plan of Care Patient;Family member/caregiver    Family Member Consulted pt's husband           Patient will benefit from skilled therapeutic intervention in order to improve the following deficits and impairments:   Cognitive communication deficit    Problem List Patient Active Problem List   Diagnosis Date Noted  . CVA (cerebral vascular accident) ([place]) 03/10/2020  . Vaginal atrophy 04/29/2016  . Fibroid, uterine 04/29/2016  . Pelvic pain in female 04/29/2016  . Hypertension 10/16/2015  . Diabetes mellitus type 2 without retinopathy ([place])  05/27/2015  . Menopause 04/29/2015  . Fibroids 04/29/2015  . Anxiety 04/29/2015  . Cervical dysplasia 04/29/2015  . History of pulmonary embolus (PE) 04/29/2015  . Family history of breast cancer in mother 04/29/2015  . Retinal detachment 04/29/2015   Jerimiah Wolman B. Rutherford Nail M.S., CCC-SLP, [place] Pathologist Rehabilitation Services Office [phone]   Stormy Fabian 03/21/2020, 4:51 PM  [place] MAIN [facility] SERVICES [address] [place], [place], [address] Phone: [phone]   Fax:  [phone]  Name: Anna Williams MRN: [id number] Date of Birth: November 22, 1945

## 2020-03-24 DIAGNOSIS — Z8673 Personal history of transient ischemic attack (TIA), and cerebral infarction without residual deficits: Secondary | ICD-10-CM | POA: Insufficient documentation

## 2020-03-24 DIAGNOSIS — F4489 Other dissociative and conversion disorders: Secondary | ICD-10-CM | POA: Insufficient documentation

## 2020-03-24 DIAGNOSIS — R4701 Aphasia: Secondary | ICD-10-CM | POA: Insufficient documentation

## 2020-03-26 ENCOUNTER — Ambulatory Visit: Payer: Medicare Other | Admitting: Speech Pathology

## 2020-03-26 ENCOUNTER — Encounter: Payer: Medicare Other | Admitting: Speech Pathology

## 2020-03-28 ENCOUNTER — Ambulatory Visit: Payer: Medicare Other | Admitting: Speech Pathology

## 2020-03-28 ENCOUNTER — Encounter: Payer: Self-pay | Admitting: Speech Pathology

## 2020-03-28 ENCOUNTER — Other Ambulatory Visit: Payer: Self-pay

## 2020-03-28 DIAGNOSIS — R41841 Cognitive communication deficit: Secondary | ICD-10-CM | POA: Diagnosis not present

## 2020-03-28 NOTE — Progress Notes (Signed)
Established patient visit  I,[name],acting as a scribe for Anna Durie, MD.,have documented all relevant documentation on the behalf of Anna Durie, MD,as directed by  Anna Durie, MD while in the presence of Anna Durie, MD.   Patient: Anna Williams   DOB: May 04, 1946   74 y.o. Female  MRN: [id number] Visit Date: 03/31/2020  Today's healthcare provider: Wilhemena Durie, MD   Chief Complaint  Patient presents with  . Follow-up    CVA   Subjective    HPI Cerebrovascular accident (CVA), unspecified mechanism ([place]) From 03/13/2020-Patient has recovered.  She suffered a stroke in the anterior left basal ganglia with subacute infarct read on imaging. Patient now on aspirin, Plavix, and Lipitor despite LDL being 61 at the time of her stroke.  I think treatment is totally appropriate.  Referred to neurology for further management. Referred to Speech Therapy. Her speech is much better and her cognition is improving also.  She is almost back to normal.  Her husband states that she is back to being herself.  She was also a little blunted emotionally during this time. She will finish her Plavix in 2 days and stop this.    She is taking her medications as directed.  Her diabetes is under fairly good control.  We will stay with Jardiance due to cardiovascular effect also.  Medications: Outpatient Medications Prior to Visit  Medication Sig  . amLODipine (NORVASC) 2.5 MG tablet TAKE 1 TABLET BY MOUTH EVERY DAY  . Apoaequorin (PREVAGEN) 10 MG CAPS Take 10 mg by mouth daily at 12 noon.  Marland Kitchen aspirin EC 81 MG tablet Take 1 tablet (81 mg total) by mouth daily.  Marland Kitchen atorvastatin (LIPITOR) 40 MG tablet Take 1 tablet (40 mg total) by mouth daily.  . Calcium Citrate-Vitamin D (CALCIUM + D PO) Take 1 tablet by mouth daily.  . citalopram (CELEXA) 20 MG tablet TAKE 1/2 TABLET BY MOUTH DAILY  . clopidogrel (PLAVIX) 75 MG tablet Take 1 tablet (75 mg total) by mouth daily  for 21 days.  Marland Kitchen glucose blood (ONE TOUCH ULTRA TEST) test strip CHECK SUGAR ONCE DAILY  . JARDIANCE 25 MG TABS tablet TAKE 1 TABLET BY MOUTH DAILY  . metFORMIN (GLUCOPHAGE-XR) 500 MG 24 hr tablet TAKE 1 TABLET BY MOUTH EVERY DAY WITH BREAKFAST (Patient taking differently: Take 500 mg by mouth daily with breakfast. )  . Multiple Vitamins-Minerals (PRESERVISION/LUTEIN PO) Take by mouth daily.   Marland Kitchen omeprazole (PRILOSEC) 20 MG capsule TAKE 1 CAPSULE BY MOUTH EVERY DAY (Patient taking differently: Take 20 mg by mouth daily. )  . ONETOUCH DELICA LANCETS FINE MISC Check sugar once daily  . pioglitazone (ACTOS) 15 MG tablet TAKE 1 TABLET BY MOUTH EVERY DAY   No facility-administered medications prior to visit.    Review of Systems  Constitutional: Negative for appetite change, chills, fatigue and fever.  HENT: Negative.   Eyes: Negative.   Respiratory: Negative for chest tightness and shortness of breath.   Cardiovascular: Negative for chest pain and palpitations.  Gastrointestinal: Negative for abdominal pain, nausea and vomiting.  Endocrine: Negative.   Genitourinary: Negative.   Musculoskeletal: Negative.   Allergic/Immunologic: Negative.   Neurological: Negative.  Negative for dizziness and weakness.  Psychiatric/Behavioral: Negative.     Last hemoglobin A1c Lab Results  Component Value Date   HGBA1C 7.4 (H) 03/10/2020      Objective    BP 130/76 (BP Location: Left Arm, Patient Position: Sitting,  Cuff Size: Large)   Pulse 73   Temp (!) 96.9 F (36.1 C) (Other (Comment))   Resp 18   Ht 5\' 7"  (1.702 m)   Wt 159 lb (72.1 kg)   SpO2 96%   BMI 24.90 kg/m  BP Readings from Last 3 Encounters:  03/31/20 130/76  03/13/20 136/76  03/12/20 (!) 114/56   Wt Readings from Last 3 Encounters:  03/31/20 159 lb (72.1 kg)  03/13/20 158 lb 12.8 oz (72 kg)  03/10/20 162 lb 0.6 oz (73.5 kg)      Physical Exam Vitals and nursing note reviewed.  Constitutional:      Appearance: Normal  appearance. She is normal weight.  Eyes:     General: No scleral icterus.    Conjunctiva/sclera: Conjunctivae normal.  Cardiovascular:     Rate and Rhythm: Normal rate and regular rhythm.     Pulses: Normal pulses.     Heart sounds: Normal heart sounds.  Pulmonary:     Effort: Pulmonary effort is normal.     Breath sounds: Normal breath sounds.  Abdominal:     General: Bowel sounds are normal.     Palpations: Abdomen is soft.  Musculoskeletal:     Cervical back: Normal range of motion and neck supple.     Right lower leg: No edema.     Left lower leg: No edema.  Skin:    General: Skin is warm and dry.  Neurological:     General: No focal deficit present.     Mental Status: She is alert and oriented to person, place, and time.  Psychiatric:        Mood and Affect: Mood normal.        Behavior: Behavior normal.        Thought Content: Thought content normal.        Judgment: Judgment normal.       No results found for any visits on 03/31/20.  Assessment & Plan     1. Diabetes mellitus type 2 without retinopathy ([place]) We will discuss adding losartan 25 mg on next visit.  Blood pressure is good but slight amount of microalbumin - POCT UA - Microalbumin--20 - TSH - Lipid panel - Comprehensive Metabolic Panel (CMET)  2.  Recent cerebrovascular accident (CVA), unspecified mechanism ([place]) Stop Plavix in 2 days.  She is still a little bit slow cognitively and has a little blunted emotional response but is getting back towards her normal state.  Speech is actually good.  Basal ganglion CVA - POCT UA - Microalbumin - TSH - Lipid panel - Comprehensive Metabolic Panel (CMET)  3. Hyperlipidemia associated with type 2 diabetes mellitus ([facility]) On atorvastatin - POCT UA - Microalbumin - TSH - Lipid panel - Comprehensive Metabolic Panel (CMET)  4. Essential hypertension Start losartan 25 mg daily on next visit. - POCT UA - Microalbumin - TSH - Lipid panel - Comprehensive  Metabolic Panel (CMET)   Return in 3 months (on 07/01/2020).      I, Anna Durie, MD, have reviewed all documentation for this visit. The documentation on 04/03/20 for the exam, diagnosis, procedures, and orders are all accurate and complete.    Barb Shear Cranford Mon, MD  [facility] [phone] (phone) [phone] (fax)  [place]

## 2020-03-28 NOTE — Therapy (Signed)
[place] MAIN [facility] SERVICES [address] [place], [place], [address] Phone: [phone]   Fax:  [phone]  Speech Language Pathology Treatment  Patient Details  Name: Anna Williams MRN: [id number] Date of Birth: Feb 05, 1946 Referring Provider (SLP): Miguel Aschoff   Encounter Date: 03/28/2020   End of Session - 03/28/20 1241    Visit Number 2    Number of Visits 25    Date for SLP Re-Evaluation 06/13/20    Authorization Type [employer]    Authorization Time Period 03/21/2020 to 06/13/2020    Authorization - Visit Number 2    Progress Note Due on Visit 10    SLP Start Time 1000    SLP Stop Time  1130    SLP Time Calculation (min) 90 min    Activity Tolerance Patient tolerated treatment well           Past Medical History:  Diagnosis Date  . Anxiety and depression   . ASCUS with positive high risk HPV 09/2013  . Diabetes mellitus without complication ([place])    type 2  . Elevated blood sugar   . Family history of breast cancer in mother   . Fibroid uterus   . History of pulmonary embolus (PE)   . Hypertension   . Menopause   . Mild dysplasia of cervix 09/2013   repeat pap done -ecc neg, cerival bx cin 1  . Retinal detachment     Past Surgical History:  Procedure Laterality Date  . anterior neck fusion    . BREAST CYST ASPIRATION Left yrs ago  . CHOLECYSTECTOMY  2010  . DILATION AND CURETTAGE OF UTERUS    . HYSTEROSCOPY    . mole removed  2014    There were no vitals filed for this visit.   Subjective Assessment - 03/28/20 1238    Subjective pt pleasant, states she has been practicing at home, appeared very encouraged    Patient is accompained by: Family member    Currently in Pain? No/denies                 ADULT SLP TREATMENT - 03/28/20 0001      General Information   Behavior/Cognition Alert;Cooperative;Pleasant mood    HPI Anna Williams is a 74 y.o. female with medical history  significant of anxiety disorder, diabetes, hypertension, hyperlipidemia, history of pulmonary embolism, with recent infarct in the left anterior basal ganglia involving lenticular nucleus and internal capsule (03/10/2020). Per chart, pt exhibited dysarthria which cleared during acute hospitalization. Of note pt fell striking back of head on 03/06/2020 with diagnosis of concussion and altered mental status.      Treatment Provided   Treatment provided Cognitive-Linquistic      Pain Assessment   Pain Assessment No/denies pain      Cognitive-Linquistic Treatment   Treatment focused on Cognition    Skilled Treatment Extensive education provided CVA, location and function of Basal Ganglia as it related to cognition as well as some predictable deficits based on location of CVA.Pictures provided and related examples provided by pt to aspects of Basal Ganglia.  All questions answered to pt and husband's satisfaction. Pt independent with semi-complex calendar task even with distractions provided. Conversational outline created to improve organization of thoughts and ability to expressed organized information.        Assessment / Recommendations / Plan   Plan Continue with current plan of care      Progression Toward Goals  Progression toward goals Progressing toward goals            SLP Education - 03/28/20 1241    Education Details location of basal ganglia, function    Person(s) Educated Patient;Spouse    Methods Explanation;Demonstration;Handout    Comprehension Verbalized understanding            SLP Short Term Goals - 03/21/20 1642      SLP SHORT TERM GOAL #1   Title Given minimal to moderate cues, pt will sequence semi-complex reasoning problem solving tasks with 80% accuracy.    Baseline no ability for semi-complex reasoning tasks    Time 10    Period --   sessions   Status New      SLP SHORT TERM GOAL #2   Title With minimum to moderate cues, pt will pre-plan problem solving  tasks with 80% accuracy.    Baseline unable, new concept    Time 10    Period --   sessions   Status New      SLP SHORT TERM GOAL #3   Title With minimum to moderate cues, pt will demonstrate emergent awareness by self-monitoring for errors during semi-complex problem solving tasks.    Baseline pt unable to self-monitor during evaluation    Time 10    Period --   sessions   Status New            SLP Long Term Goals - 03/21/20 1650      SLP LONG TERM GOAL #1   Title Pt will identify cognitive-communication barriers and participate in developing compensatory strategies.    Baseline new goal    Time 12    Period Weeks    Status New    Target Date 06/13/20      SLP LONG TERM GOAL #2   Title Pt will increase executive cognitive functions to complete complex reasoning tasks.    Baseline moderate deficits in executive functions    Time 10    Period --   sessions   Status New            Plan - 03/28/20 1242    Clinical Impression Statement Skilled treatment session targeted pt's cognitive communication goals. [name] husband reports difficulty understanding pt's thoughts during conversation after pt gets home from work. SLP facilitated session by providing maximal questions to understand pt's description of work.  SLP further created outline for pt to use when conveying information.    Speech Therapy Frequency 2x / week    Duration --   12 weeks   Treatment/Interventions Cognitive reorganization;Patient/family education;SLP instruction and feedback    Potential to Achieve Goals Good    SLP Home Exercise Plan given - use outline to organize thoughts to convey information about work    Consulted and Agree with Plan of Care Patient;Family member/caregiver           Patient will benefit from skilled therapeutic intervention in order to improve the following deficits and impairments:   Cognitive communication deficit    Problem List Patient Active Problem List   Diagnosis  Date Noted  . CVA (cerebral vascular accident) ([place]) 03/10/2020  . Vaginal atrophy 04/29/2016  . Fibroid, uterine 04/29/2016  . Pelvic pain in female 04/29/2016  . Hypertension 10/16/2015  . Diabetes mellitus type 2 without retinopathy ([place]) 05/27/2015  . Menopause 04/29/2015  . Fibroids 04/29/2015  . Anxiety 04/29/2015  . Cervical dysplasia 04/29/2015  . History of pulmonary embolus (PE) 04/29/2015  .  Family history of breast cancer in mother 04/29/2015  . Retinal detachment 04/29/2015   Kalid Ghan B. Rutherford Nail M.S., CCC-SLP, [place] Pathologist Rehabilitation Services Office [phone]  Stormy Fabian 03/28/2020, 12:46 PM  [place] MAIN [facility] SERVICES [address] [place], [place], [address] Phone: [phone]   Fax:  [phone]   Name: Anna Williams MRN: [id number] Date of Birth: September 26, 1946

## 2020-03-30 ENCOUNTER — Other Ambulatory Visit: Payer: Self-pay | Admitting: Family Medicine

## 2020-03-31 ENCOUNTER — Encounter: Payer: Self-pay | Admitting: Family Medicine

## 2020-03-31 ENCOUNTER — Other Ambulatory Visit: Payer: Self-pay

## 2020-03-31 ENCOUNTER — Ambulatory Visit (INDEPENDENT_AMBULATORY_CARE_PROVIDER_SITE_OTHER): Payer: Medicare Other | Admitting: Family Medicine

## 2020-03-31 VITALS — BP 130/76 | HR 73 | Temp 96.9°F | Resp 18 | Ht 67.0 in | Wt 159.0 lb

## 2020-03-31 DIAGNOSIS — I639 Cerebral infarction, unspecified: Secondary | ICD-10-CM | POA: Diagnosis not present

## 2020-03-31 DIAGNOSIS — I69319 Unspecified symptoms and signs involving cognitive functions following cerebral infarction: Secondary | ICD-10-CM | POA: Diagnosis not present

## 2020-03-31 DIAGNOSIS — E1169 Type 2 diabetes mellitus with other specified complication: Secondary | ICD-10-CM

## 2020-03-31 DIAGNOSIS — I10 Essential (primary) hypertension: Secondary | ICD-10-CM | POA: Diagnosis not present

## 2020-03-31 DIAGNOSIS — E119 Type 2 diabetes mellitus without complications: Secondary | ICD-10-CM

## 2020-03-31 DIAGNOSIS — E785 Hyperlipidemia, unspecified: Secondary | ICD-10-CM

## 2020-03-31 LAB — POCT UA - MICROALBUMIN: Microalbumin Ur, POC: 20 mg/L

## 2020-04-01 ENCOUNTER — Ambulatory Visit: Payer: Medicare Other | Admitting: Speech Pathology

## 2020-04-01 ENCOUNTER — Encounter: Payer: Self-pay | Admitting: Speech Pathology

## 2020-04-01 ENCOUNTER — Other Ambulatory Visit: Payer: Self-pay

## 2020-04-01 DIAGNOSIS — R41841 Cognitive communication deficit: Secondary | ICD-10-CM | POA: Diagnosis not present

## 2020-04-01 NOTE — Therapy (Signed)
[place] MAIN [facility] SERVICES [address] [place], [place], [address] Phone: [phone]   Fax:  [phone]  Speech Language Pathology Treatment  Patient Details  Name: Anna Williams MRN: [id number] Date of Birth: 1946/09/19 Referring Provider (SLP): Miguel Aschoff   Encounter Date: 04/01/2020   End of Session - 04/01/20 1442    Visit Number 3    Number of Visits 25    Date for SLP Re-Evaluation 06/13/20    Authorization Type [employer]    Authorization Time Period 03/21/2020 to 06/13/2020    Authorization - Visit Number 3    Progress Note Due on Visit 10    SLP Start Time 1300    SLP Stop Time  1400    SLP Time Calculation (min) 60 min    Activity Tolerance Patient tolerated treatment well           Past Medical History:  Diagnosis Date  . Anxiety and depression   . ASCUS with positive high risk HPV 09/2013  . Diabetes mellitus without complication ([place])    type 2  . Elevated blood sugar   . Family history of breast cancer in mother   . Fibroid uterus   . History of pulmonary embolus (PE)   . Hypertension   . Menopause   . Mild dysplasia of cervix 09/2013   repeat pap done -ecc neg, cerival bx cin 1  . Retinal detachment     Past Surgical History:  Procedure Laterality Date  . anterior neck fusion    . BREAST CYST ASPIRATION Left yrs ago  . CHOLECYSTECTOMY  2010  . DILATION AND CURETTAGE OF UTERUS    . HYSTEROSCOPY    . mole removed  2014    There were no vitals filed for this visit.   Subjective Assessment - 04/01/20 1441    Subjective pt pleasant, initial disorganized expression noted    Patient is accompained by: Family member    Currently in Pain? No/denies                 ADULT SLP TREATMENT - 04/01/20 0001      General Information   Behavior/Cognition Alert;Cooperative;Pleasant mood    HPI Anna Williams is a 74 y.o. female with medical history significant of anxiety  disorder, diabetes, hypertension, hyperlipidemia, history of pulmonary embolism, with recent infarct in the left anterior basal ganglia involving lenticular nucleus and internal capsule (03/10/2020). Per chart, pt exhibited dysarthria which cleared during acute hospitalization. Of note pt fell striking back of head on 03/06/2020 with diagnosis of concussion and altered mental status.      Treatment Provided   Treatment provided Cognitive-Linquistic      Pain Assessment   Pain Assessment No/denies pain      Cognitive-Linquistic Treatment   Treatment focused on Cognition    Skilled Treatment Introduced strategies of preplanning, task organization and sequence during semi-complex deductive reasoning puzzle. During session, pt began demonstrating awareness of need for strategies and was able to initially utilize for ~ 25% of task.       Assessment / Recommendations / Plan   Plan Continue with current plan of care      Progression Toward Goals   Progression toward goals Progressing toward goals            SLP Education - 04/01/20 1442    Education Details executive functions    Person(s) Educated Patient;Spouse    Methods Explanation;Demonstration;Verbal cues;Handout  Comprehension Verbalized understanding            SLP Short Term Goals - 03/21/20 1642      SLP SHORT TERM GOAL #1   Title Given minimal to moderate cues, pt will sequence semi-complex reasoning problem solving tasks with 80% accuracy.    Baseline no ability for semi-complex reasoning tasks    Time 10    Period --   sessions   Status New      SLP SHORT TERM GOAL #2   Title With minimum to moderate cues, pt will pre-plan problem solving tasks with 80% accuracy.    Baseline unable, new concept    Time 10    Period --   sessions   Status New      SLP SHORT TERM GOAL #3   Title With minimum to moderate cues, pt will demonstrate emergent awareness by self-monitoring for errors during semi-complex problem solving  tasks.    Baseline pt unable to self-monitor during evaluation    Time 10    Period --   sessions   Status New            SLP Long Term Goals - 03/21/20 1650      SLP LONG TERM GOAL #1   Title Pt will identify cognitive-communication barriers and participate in developing compensatory strategies.    Baseline new goal    Time 12    Period Weeks    Status New    Target Date 06/13/20      SLP LONG TERM GOAL #2   Title Pt will increase executive cognitive functions to complete complex reasoning tasks.    Baseline moderate deficits in executive functions    Time 10    Period --   sessions   Status New            Plan - 04/01/20 1443    Clinical Impression Statement Skilled treatment session targeted pt's cognitive communication goals. SLP facilitated session by promoting review of conversation outline to help with verbal organization. Pt didn't bring back to session, so it was difficult to assess which salient items pt wrote. Overall, pt and her husband voiced improvement with pt's verbal organization. Will continue to monitor. SLP further facilitated session by providing semi-complex deductive reasoning puzzle. Pt didn't have an organized approach to solving puzzle. SLP used puzzle to list steps to follow for task success. Pt receptive and attempted to use strategies in a couple of instances throughout course of task. This is new concept for pt, she performed well.    Speech Therapy Frequency 2x / week    Duration --   12 weeks   Treatment/Interventions Cognitive reorganization;Patient/family education;SLP instruction and feedback    Potential to Achieve Goals Good    SLP Home Exercise Plan given - use outline to organize thoughts to convey information about work    Consulted and Agree with Plan of Care Patient;Family member/caregiver    Family Member Consulted pt's husband           Patient will benefit from skilled therapeutic intervention in order to improve the following  deficits and impairments:   Cognitive communication deficit    Problem List Patient Active Problem List   Diagnosis Date Noted  . CVA (cerebral vascular accident) ([place]) 03/10/2020  . Vaginal atrophy 04/29/2016  . Fibroid, uterine 04/29/2016  . Pelvic pain in female 04/29/2016  . Hypertension 10/16/2015  . Diabetes mellitus type 2 without retinopathy ([place]) 05/27/2015  . Menopause 04/29/2015  .  Fibroids 04/29/2015  . Anxiety 04/29/2015  . Cervical dysplasia 04/29/2015  . History of pulmonary embolus (PE) 04/29/2015  . Family history of breast cancer in mother 04/29/2015  . Retinal detachment 04/29/2015   Demetris Capell B. Rutherford Nail M.S., CCC-SLP, [place] Pathologist Rehabilitation Services Office [phone]  Stormy Fabian 04/01/2020, 2:44 PM  [place] MAIN [facility] SERVICES [address] [place], [place], [address] Phone: [phone]   Fax:  [phone]   Name: THAILYN KHALID MRN: [id number] Date of Birth: November 16, 1945

## 2020-04-04 ENCOUNTER — Encounter: Payer: Self-pay | Admitting: Speech Pathology

## 2020-04-04 ENCOUNTER — Ambulatory Visit: Payer: Medicare Other | Admitting: Speech Pathology

## 2020-04-04 ENCOUNTER — Other Ambulatory Visit: Payer: Self-pay

## 2020-04-04 DIAGNOSIS — R41841 Cognitive communication deficit: Secondary | ICD-10-CM | POA: Diagnosis not present

## 2020-04-04 NOTE — Therapy (Signed)
[place] MAIN [facility] SERVICES [address] [place], [place], [address] Phone: [phone]   Fax:  [phone]  Speech Language Pathology Treatment  Patient Details  Name: Anna Williams MRN: [id number] Date of Birth: [birth year] Referring Provider (SLP): Miguel Aschoff   Encounter Date: 04/04/2020   End of Session - 04/04/20 1526    Visit Number 4    Number of Visits 25    Date for SLP Re-Evaluation 06/13/20    Authorization Type [employer]    Authorization Time Period 03/21/2020 to 06/13/2020    Authorization - Visit Number 4    Progress Note Due on Visit 10    SLP Start Time 1100    SLP Stop Time  1200    SLP Time Calculation (min) 60 min    Activity Tolerance Patient tolerated treatment well           Past Medical History:  Diagnosis Date  . Anxiety and depression   . ASCUS with positive high risk HPV 09/2013  . Diabetes mellitus without complication ([place])    type 2  . Elevated blood sugar   . Family history of breast cancer in mother   . Fibroid uterus   . History of pulmonary embolus (PE)   . Hypertension   . Menopause   . Mild dysplasia of cervix 09/2013   repeat pap done -ecc neg, cerival bx cin 1  . Retinal detachment     Past Surgical History:  Procedure Laterality Date  . anterior neck fusion    . BREAST CYST ASPIRATION Left yrs ago  . CHOLECYSTECTOMY  2010  . DILATION AND CURETTAGE OF UTERUS    . HYSTEROSCOPY    . mole removed  2014    There were no vitals filed for this visit.   Subjective Assessment - 04/04/20 1524    Subjective pt pleasant, improved thought organization noted    Patient is accompained by: Family member    Currently in Pain? No/denies                 ADULT SLP TREATMENT - 04/04/20 0001      General Information   Behavior/Cognition Alert;Cooperative;Pleasant mood    HPI Anna Williams is a 74 y.o. female with medical history significant of anxiety  disorder, diabetes, hypertension, hyperlipidemia, history of pulmonary embolism, with recent infarct in the left anterior basal ganglia involving lenticular nucleus and internal capsule (03/10/2020). Per chart, pt exhibited dysarthria which cleared during acute hospitalization. Of note pt fell striking back of head on 03/06/2020 with diagnosis of concussion and altered mental status.      Treatment Provided   Treatment provided Cognitive-Linquistic      Pain Assessment   Pain Assessment No/denies pain      Cognitive-Linquistic Treatment   Treatment focused on Cognition    Skilled Treatment Minimal faded to supervision cues to solve complex deductive reasoning puzzle. Independent organization of thoughts for effective communication of unknown details to listener.       Assessment / Recommendations / Plan   Plan Continue with current plan of care      Progression Toward Goals   Progression toward goals Progressing toward goals            SLP Education - 04/04/20 1525    Education Details executive cognitive functions    Person(s) Educated Patient;Spouse    Methods Explanation;Demonstration;Verbal cues;Handout    Comprehension Verbalized understanding  SLP Short Term Goals - 03/21/20 1642      SLP SHORT TERM GOAL #1   Title Given minimal to moderate cues, pt will sequence semi-complex reasoning problem solving tasks with 80% accuracy.    Baseline no ability for semi-complex reasoning tasks    Time 10    Period --   sessions   Status New      SLP SHORT TERM GOAL #2   Title With minimum to moderate cues, pt will pre-plan problem solving tasks with 80% accuracy.    Baseline unable, new concept    Time 10    Period --   sessions   Status New      SLP SHORT TERM GOAL #3   Title With minimum to moderate cues, pt will demonstrate emergent awareness by self-monitoring for errors during semi-complex problem solving tasks.    Baseline pt unable to self-monitor during  evaluation    Time 10    Period --   sessions   Status New            SLP Long Term Goals - 03/21/20 1650      SLP LONG TERM GOAL #1   Title Pt will identify cognitive-communication barriers and participate in developing compensatory strategies.    Baseline new goal    Time 12    Period Weeks    Status New    Target Date 06/13/20      SLP LONG TERM GOAL #2   Title Pt will increase executive cognitive functions to complete complex reasoning tasks.    Baseline moderate deficits in executive functions    Time 10    Period --   sessions   Status New            Plan - 04/04/20 1526    Clinical Impression Statement Skilled treatment session targeted pt's cognitive communication goals. SLP facilitated session by reviewing written conversation outline provided by pt. Appropriate details identified and pt was able to convey 2 separate conversational topics with great thought organization and enough details for listener understanding. Pt and her husband report increased ability to independently organize thoughts without written prompts and increased organization of food preparation.    Speech Therapy Frequency 2x / week    Duration --   12 weeks   Treatment/Interventions Cognitive reorganization;Patient/family education;SLP instruction and feedback    Potential to Achieve Goals Good    SLP Home Exercise Plan given - answer bluetooth in car while parked in driveway    Consulted and Agree with Plan of Care Patient;Family member/caregiver    Family Member Consulted pt's husband           Patient will benefit from skilled therapeutic intervention in order to improve the following deficits and impairments:   Cognitive communication deficit    Problem List Patient Active Problem List   Diagnosis Date Noted  . CVA (cerebral vascular accident) ([place]) 03/10/2020  . Vaginal atrophy 04/29/2016  . Fibroid, uterine 04/29/2016  . Pelvic pain in female 04/29/2016  . Hypertension  10/16/2015  . Diabetes mellitus type 2 without retinopathy ([place]) 05/27/2015  . Menopause 04/29/2015  . Fibroids 04/29/2015  . Anxiety 04/29/2015  . Cervical dysplasia 04/29/2015  . History of pulmonary embolus (PE) 04/29/2015  . Family history of breast cancer in mother 04/29/2015  . Retinal detachment 04/29/2015   Anna Williams B. Rutherford Nail M.S., CCC-SLP, [place] Office [phone]   Stormy Fabian 04/04/2020, 3:27 PM  [place]  [place], [place], [address] Phone: [phone]   Fax:  [phone]   Name: Anna Williams MRN: [id number] Date of Birth: 21-Mar-1946

## 2020-04-07 ENCOUNTER — Ambulatory Visit: Payer: Medicare Other | Admitting: Speech Pathology

## 2020-04-07 ENCOUNTER — Other Ambulatory Visit: Payer: Self-pay

## 2020-04-07 DIAGNOSIS — R41841 Cognitive communication deficit: Secondary | ICD-10-CM

## 2020-04-08 ENCOUNTER — Encounter: Payer: Self-pay | Admitting: Speech Pathology

## 2020-04-08 NOTE — Therapy (Signed)
[place] MAIN [facility] SERVICES [address] [place], [place], [address] Phone: [phone]   Fax:  [phone]  Speech Language Pathology Treatment  Patient Details  Name: Anna Williams MRN: [id number] Date of Birth: 1946-07-08 Referring Provider (SLP): Miguel Aschoff   Encounter Date: 04/07/2020   End of Session - 04/08/20 1339    Visit Number 5    Number of Visits 25    Date for SLP Re-Evaluation 06/13/20    Authorization Type [employer]    Authorization Time Period 03/21/2020 to 06/13/2020    Authorization - Visit Number 5    Progress Note Due on Visit 10    SLP Start Time 1500    SLP Stop Time  1600    SLP Time Calculation (min) 60 min    Activity Tolerance Patient tolerated treatment well           Past Medical History:  Diagnosis Date  . Anxiety and depression   . ASCUS with positive high risk HPV 09/2013  . Diabetes mellitus without complication ([place])    type 2  . Elevated blood sugar   . Family history of breast cancer in mother   . Fibroid uterus   . History of pulmonary embolus (PE)   . Hypertension   . Menopause   . Mild dysplasia of cervix 09/2013   repeat pap done -ecc neg, cerival bx cin 1  . Retinal detachment     Past Surgical History:  Procedure Laterality Date  . anterior neck fusion    . BREAST CYST ASPIRATION Left yrs ago  . CHOLECYSTECTOMY  2010  . DILATION AND CURETTAGE OF UTERUS    . HYSTEROSCOPY    . mole removed  2014    There were no vitals filed for this visit.   Subjective Assessment - 04/08/20 1337    Subjective although pt is pleasant, engaged    Patient is accompained by: Family member    Currently in Pain? No/denies                 ADULT SLP TREATMENT - 04/08/20 0001      General Information   Behavior/Cognition Alert;Cooperative;Pleasant mood    HPI Anna Williams is a 74 y.o. female with medical history significant of anxiety disorder, diabetes,  hypertension, hyperlipidemia, history of pulmonary embolism, with recent infarct in the left anterior basal ganglia involving lenticular nucleus and internal capsule (03/10/2020). Per chart, pt exhibited dysarthria which cleared during acute hospitalization. Of note pt fell striking back of head on 03/06/2020 with diagnosis of concussion and altered mental status.      Treatment Provided   Treatment provided Cognitive-Linquistic      Pain Assessment   Pain Assessment No/denies pain      Cognitive-Linquistic Treatment   Treatment focused on Cognition    Skilled Treatment Moderately vague difficult to understand description of using Bluetooth to answer/end phone calls; very disorganized minimal description of bills that she pays. General inability to use apps on phone d/t decreased organization of apps.       Assessment / Recommendations / Plan   Plan Continue with current plan of care      Progression Toward Goals   Progression toward goals Progressing toward goals            SLP Education - 04/08/20 1339    Education Details executive cognitive functions    Person(s) Educated Patient;Spouse    Methods Explanation;Demonstration;Verbal cues;Handout  Comprehension Verbalized understanding            SLP Short Term Goals - 03/21/20 1642      SLP SHORT TERM GOAL #1   Title Given minimal to moderate cues, pt will sequence semi-complex reasoning problem solving tasks with 80% accuracy.    Baseline no ability for semi-complex reasoning tasks    Time 10    Period --   sessions   Status New      SLP SHORT TERM GOAL #2   Title With minimum to moderate cues, pt will pre-plan problem solving tasks with 80% accuracy.    Baseline unable, new concept    Time 10    Period --   sessions   Status New      SLP SHORT TERM GOAL #3   Title With minimum to moderate cues, pt will demonstrate emergent awareness by self-monitoring for errors during semi-complex problem solving tasks.     Baseline pt unable to self-monitor during evaluation    Time 10    Period --   sessions   Status New            SLP Long Term Goals - 03/21/20 1650      SLP LONG TERM GOAL #1   Title Pt will identify cognitive-communication barriers and participate in developing compensatory strategies.    Baseline new goal    Time 12    Period Weeks    Status New    Target Date 06/13/20      SLP LONG TERM GOAL #2   Title Pt will increase executive cognitive functions to complete complex reasoning tasks.    Baseline moderate deficits in executive functions    Time 10    Period --   sessions   Status New            Plan - 04/08/20 1339    Clinical Impression Statement Skilled treatment session targeted pt's executive function goals. SLP facilitated session by providing moderate number of questions to gain understanding of pt's verbal descriptions of tasks/activities. In spontaneous conversation, pt is not aware of decreased verbal organization and listener's decreased understanding. As such, pt's husband had to provide information on bills that pt pays. She also reports that she has difficulty using the apps on her phone. Upon inspection, pt's app are disorganized and difficult to access. Will target in future session.    Speech Therapy Frequency 2x / week    Duration --   12 weeks   Treatment/Interventions Cognitive reorganization;Patient/family education;SLP instruction and feedback    Potential to Achieve Goals Good    SLP Home Exercise Plan given - track which apps she uses the most over the week    Consulted and Agree with Plan of Care Patient;Family member/caregiver    Family Member Consulted pt's husband           Patient will benefit from skilled therapeutic intervention in order to improve the following deficits and impairments:   Cognitive communication deficit    Problem List Patient Active Problem List   Diagnosis Date Noted  . CVA (cerebral vascular accident) ([place])  03/10/2020  . Vaginal atrophy 04/29/2016  . Fibroid, uterine 04/29/2016  . Pelvic pain in female 04/29/2016  . Hypertension 10/16/2015  . Diabetes mellitus type 2 without retinopathy ([place]) 05/27/2015  . Menopause 04/29/2015  . Fibroids 04/29/2015  . Anxiety 04/29/2015  . Cervical dysplasia 04/29/2015  . History of pulmonary embolus (PE) 04/29/2015  . Family history of breast  cancer in mother 04/29/2015  . Retinal detachment 04/29/2015   Anna Williams M.S., CCC-SLP, [place] Pathologist Rehabilitation Services Office [phone]   Stormy Fabian 04/08/2020, 1:41 PM  [place] [employer] MAIN [facility] SERVICES [address] [place], [place], [address] Phone: [phone]   Fax:  [phone]   Name: Anna Williams MRN: [id number] Date of Birth: [birth year]

## 2020-04-09 ENCOUNTER — Encounter: Payer: Self-pay | Admitting: Speech Pathology

## 2020-04-09 ENCOUNTER — Ambulatory Visit: Payer: Medicare Other | Admitting: Speech Pathology

## 2020-04-09 ENCOUNTER — Other Ambulatory Visit: Payer: Self-pay

## 2020-04-09 DIAGNOSIS — R41841 Cognitive communication deficit: Secondary | ICD-10-CM | POA: Diagnosis not present

## 2020-04-09 NOTE — Therapy (Signed)
[place] MAIN [facility] SERVICES [address] [place], [place], [address] Phone: [phone]   Fax:  [phone]  Speech Language Pathology Treatment  Patient Details  Name: Anna Williams MRN: [id number] Date of Birth: 08/31/1946 Referring Provider (SLP): Miguel Aschoff   Encounter Date: 04/09/2020   End of Session - 04/09/20 1457    Visit Number 6    Number of Visits 25    Date for SLP Re-Evaluation 06/13/20    Authorization Type [employer] Medicare    Authorization Time Period 03/21/2020 to 06/13/2020    Authorization - Visit Number 6    Progress Note Due on Visit 10    SLP Start Time 1400    SLP Stop Time  1450    SLP Time Calculation (min) 50 min    Activity Tolerance Patient tolerated treatment well           Past Medical History:  Diagnosis Date  . Anxiety and depression   . ASCUS with positive high risk HPV 09/2013  . Diabetes mellitus without complication ([place])    type 2  . Elevated blood sugar   . Family history of breast cancer in mother   . Fibroid uterus   . History of pulmonary embolus (PE)   . Hypertension   . Menopause   . Mild dysplasia of cervix 09/2013   repeat pap done -ecc neg, cerival bx cin 1  . Retinal detachment     Past Surgical History:  Procedure Laterality Date  . anterior neck fusion    . BREAST CYST ASPIRATION Left yrs ago  . CHOLECYSTECTOMY  2010  . DILATION AND CURETTAGE OF UTERUS    . HYSTEROSCOPY    . mole removed  2014    There were no vitals filed for this visit.   Subjective Assessment - 04/09/20 1455    Subjective pt is pleasant, engaged    Patient is accompained by: Family member    Currently in Pain? No/denies                 ADULT SLP TREATMENT - 04/09/20 0001      General Information   Behavior/Cognition Alert;Cooperative;Pleasant mood    HPI Anna Williams is a 74 y.o. female with medical history significant of anxiety disorder, diabetes,  hypertension, hyperlipidemia, history of pulmonary embolism, with recent infarct in the left anterior basal ganglia involving lenticular nucleus and internal capsule (03/10/2020). Per chart, pt exhibited dysarthria which cleared during acute hospitalization. Of note pt fell striking back of head on 03/06/2020 with diagnosis of concussion and altered mental status.      Treatment Provided   Treatment provided Cognitive-Linquistic      Pain Assessment   Pain Assessment No/denies pain      Cognitive-Linquistic Treatment   Treatment focused on Cognition    Skilled Treatment Supervision cues to list apps that pt frequently uses and created strategies to organize apps in effecient way, supervision cues for memory in using [place] / Recommendations / [place] with current plan of care      Progression Toward Goals   Progression toward goals Progressing toward goals            SLP Education - 04/09/20 1456    Education Details execituve cognitive functions    Person(s) Educated Patient;Spouse    Methods Explanation;Demonstration    Comprehension Verbalized understanding  SLP Short Term Goals - 03/21/20 1642      SLP SHORT TERM GOAL #1   Title Given minimal to moderate cues, pt will sequence semi-complex reasoning problem solving tasks with 80% accuracy.    Baseline no ability for semi-complex reasoning tasks    Time 10    Period --   sessions   Status New      SLP SHORT TERM GOAL #2   Title With minimum to moderate cues, pt will pre-plan problem solving tasks with 80% accuracy.    Baseline unable, new concept    Time 10    Period --   sessions   Status New      SLP SHORT TERM GOAL #3   Title With minimum to moderate cues, pt will demonstrate emergent awareness by self-monitoring for errors during semi-complex problem solving tasks.    Baseline pt unable to self-monitor during evaluation    Time 10    Period --   sessions    Status New            SLP Long Term Goals - 03/21/20 1650      SLP LONG TERM GOAL #1   Title Pt will identify cognitive-communication barriers and participate in developing compensatory strategies.    Baseline new goal    Time 12    Period Weeks    Status New    Target Date 06/13/20      SLP LONG TERM GOAL #2   Title Pt will increase executive cognitive functions to complete complex reasoning tasks.    Baseline moderate deficits in executive functions    Time 10    Period --   sessions   Status New            Plan - 04/09/20 1457    Clinical Impression Statement Skilled treatment session targeted pt's cognitive organization skills. SLP facilitated session by providing minimal assistance for organizing apps in effecient manner as well as minimal assisatance to use [employer] app.    Speech Therapy Frequency 2x / week    Duration --   12 weeks   Treatment/Interventions Cognitive reorganization;Patient/family education;SLP instruction and feedback    Potential to Achieve Goals Good    SLP Home Exercise Plan given - track which apps she uses the most over the week    Consulted and Agree with Plan of Care Patient;Family member/caregiver    Family Member Consulted pt's husband           Patient will benefit from skilled therapeutic intervention in order to improve the following deficits and impairments:   Cognitive communication deficit    Problem List Patient Active Problem List   Diagnosis Date Noted  . CVA (cerebral vascular accident) ([place]) 03/10/2020  . Vaginal atrophy 04/29/2016  . Fibroid, uterine 04/29/2016  . Pelvic pain in female 04/29/2016  . Hypertension 10/16/2015  . Diabetes mellitus type 2 without retinopathy ([place]) 05/27/2015  . Menopause 04/29/2015  . Fibroids 04/29/2015  . Anxiety 04/29/2015  . Cervical dysplasia 04/29/2015  . History of pulmonary embolus (PE) 04/29/2015  . Family history of breast cancer in mother 04/29/2015  . Retinal  detachment 04/29/2015   Nabeeha Badertscher B. Rutherford Nail M.S., CCC-SLP, [place] Pathologist Rehabilitation Services Office [phone]  Stormy Fabian 04/09/2020, 2:59 PM  [place] MAIN [facility] SERVICES [address] [place], [place], [address] Phone: [phone]   Fax:  [phone]   Name: RAHAF CARBONELL MRN: [id number] Date of Birth: Oct 22, 1945

## 2020-04-12 ENCOUNTER — Other Ambulatory Visit: Payer: Self-pay | Admitting: Family Medicine

## 2020-04-12 DIAGNOSIS — E119 Type 2 diabetes mellitus without complications: Secondary | ICD-10-CM

## 2020-04-12 NOTE — Telephone Encounter (Signed)
Requested Prescriptions  Pending Prescriptions Disp Refills  . pioglitazone (ACTOS) 15 MG tablet [Pharmacy Med Name: PIOGLITAZONE HCL 15 MG TABLET] 90 tablet 0    Sig: TAKE 1 TABLET BY MOUTH EVERY DAY     Endocrinology:  Diabetes - Glitazones - pioglitazone Passed - 04/12/2020  4:25 PM      Passed - HBA1C is between 0 and 7.9 and within 180 days    Hgb A1c MFr Bld  Date Value Ref Range Status  03/10/2020 7.4 (H) 4.8 - 5.6 % Final    Comment:    (NOTE) Pre diabetes:          5.7%-6.4% Diabetes:              >6.4% Glycemic control for   <7.0% adults with diabetes          Passed - Valid encounter within last 6 months    Recent Outpatient Visits          1 week ago CVA, old, cognitive deficits   [place] Rosanna Randy, Retia Passe., MD   1 month ago Cerebrovascular accident (CVA), unspecified mechanism [facility])   [facility] Jerrol Banana[facility] MD   1 month ago Traumatic injury of head, initial encounter   [facility] Jerrol Banana[facility] MD   8 months ago Diabetes mellitus type 2 without retinopathy [facility])   [facility] Jerrol Banana[facility] MD   11 months ago Diabetes mellitus type 2 without retinopathy [facility])   [facility] Jerrol Banana., MD      Future Appointments            In 2 months Jerrol Banana., MD [facility]           . atorvastatin (LIPITOR) 40 MG tablet [Pharmacy Med Name: ATORVASTATIN 40 MG TABLET] 30 tablet     Sig: TAKE 1 TABLET BY MOUTH EVERY DAY     Cardiovascular:  Antilipid - Statins Passed - 04/12/2020  4:25 PM      Passed - Total Cholesterol in normal range and within 360 days    Cholesterol, Total  Date Value Ref Range Status  12/20/2017 170 100 - 199 mg/dL Final   Cholesterol  Date Value Ref Range Status  03/11/2020 125 0 - 200 mg/dL Final         Passed - LDL in normal range and within 360 days    LDL Calculated  Date Value  Ref Range Status  12/20/2017 91 0 - 99 mg/dL Final   LDL Cholesterol  Date Value Ref Range Status  03/11/2020 61 0 - 99 mg/dL Final    Comment:           Total Cholesterol/HDL:CHD Risk Coronary Heart Disease Risk Table                     Men   Women  1/2 Average Risk   3.4   3.3  Average Risk       5.0   4.4  2 X Average Risk   9.6   7.1  3 X Average Risk  23.4   11.0        Use the calculated Patient Ratio above and the CHD Risk Table to determine the patient's CHD Risk.        ATP III CLASSIFICATION (LDL):  <100     mg/dL   Optimal  100-129  mg/dL  Near or Above                    Optimal  130-159  mg/dL   Borderline  160-189  mg/dL   High  >190     mg/dL   Very High Performed at [facility], [place], [place] [address]          Passed - HDL in normal range and within 360 days    HDL  Date Value Ref Range Status  03/11/2020 42 >40 mg/dL Final  12/20/2017 51 >39 mg/dL Final         Passed - Triglycerides in normal range and within 360 days    Triglycerides  Date Value Ref Range Status  03/11/2020 112 <150 mg/dL Final         Passed - Patient is not pregnant      Passed - Valid encounter within last 12 months    Recent Outpatient Visits          1 week ago CVA, old, cognitive deficits   [facility]   1 month ago Cerebrovascular accident (CVA), unspecified mechanism [facility])   [facility] Ban[facility] MD   1 month ago Traumatic injury of head, initial encounter   [facility] Jerrol Banana., MD   8 months ago Diabetes mellitus type 2 without retinopathy [facility])   [facility] Jerrol Banana., MD   11 months ago Diabetes mellitus type 2 without retinopathy [facility])   [facility] Jerrol Banana., MD      Future Appointments            In 2 months Jerrol Banana., MD [facility], [facility]            . pantoprazole (PROTONIX) 40 MG tablet [Pharmacy Med Name: PANTOPRAZOLE SOD DR 40 MG TAB] 30 tablet     Sig: TAKE 1 TABLET BY MOUTH EVERY DAY (STOP OMEPRAZOLE)     Gastroenterology: Proton Pump Inhibitors Passed - 04/12/2020  4:25 PM      Passed - Valid encounter within last 12 months    Recent Outpatient Visits          1 week ago CVA, old, cognitive deficits   [facility] Jer[facility] Banana., MD   1 month ago Cerebrovascular accident (CVA), unspecified mechanism [facility])   [facility] Jer[facility] Banana., MD   1 month ago Traumatic injury of head, initial encounter   [facility] Jerrol Banana., MD   8 months ago Diabetes mellitus type 2 without retinopathy [facility])   [facility] Jerrol Banana., MD   11 months ago Diabetes mellitus type 2 without retinopathy [facility])   [facility] Jerrol Banana., MD      Future Appointments            In 2 months Jerrol Banana., MD [facility], [facility]

## 2020-04-12 NOTE — Telephone Encounter (Signed)
Requested medication (s) are due for refill today: yes  Requested medication (s) are on the active medication list: yes  Last refill:  03/12/20  Future visit scheduled: yes  Notes to clinic:  was prescribed at discharge from hospital     Requested Prescriptions  Pending Prescriptions Disp Refills   atorvastatin (LIPITOR) 40 MG tablet [Pharmacy Med Name: ATORVASTATIN 40 MG TABLET] 30 tablet     Sig: TAKE 1 TABLET BY MOUTH EVERY DAY      Cardiovascular:  Antilipid - Statins Passed - 04/12/2020  4:25 PM      Passed - Total Cholesterol in normal range and within 360 days    Cholesterol, Total  Date Value Ref Range Status  12/20/2017 170 100 - 199 mg/dL Final   Cholesterol  Date Value Ref Range Status  03/11/2020 125 0 - 200 mg/dL Final          Passed - LDL in normal range and within 360 days    LDL Calculated  Date Value Ref Range Status  12/20/2017 91 0 - 99 mg/dL Final   LDL Cholesterol  Date Value Ref Range Status  03/11/2020 61 0 - 99 mg/dL Final    Comment:           Total Cholesterol/HDL:CHD Risk Coronary Heart Disease Risk Table                     Men   Women  1/2 Average Risk   3.4   3.3  Average Risk       5.0   4.4  2 X Average Risk   9.6   7.1  3 X Average Risk  23.4   11.0        Use the calculated Patient Ratio above and the CHD Risk Table to determine the patient's CHD Risk.        ATP III CLASSIFICATION (LDL):  <100     mg/dL   Optimal  100-129  mg/dL   Near or Above                    Optimal  130-159  mg/dL   Borderline  160-189  mg/dL   High  >190     mg/dL   Very High Performed at [facility], [place], [place] [address]           Passed - HDL in normal range and within 360 days    HDL  Date Value Ref Range Status  03/11/2020 42 >40 mg/dL Final  12/20/2017 51 >39 mg/dL Final          Passed - Triglycerides in normal range and within 360 days    Triglycerides  Date Value Ref Range Status  03/11/2020 112  <150 mg/dL Final          Passed - Patient is not pregnant      Passed - Valid encounter within last 12 months    Recent Outpatient Visits           1 week ago CVA, old, cognitive deficits   [facility]   1 month ago Cerebrovascular accident (CVA), unspecified mechanism [facility])   [facility] Jer[facility] Banana[facility] MD   1 month ago Traumatic injury of head, initial encounter   [facility] Jer[facility] Banana[facility] MD   8 months ago Diabetes mellitus type 2 without retinopathy ([place])  [facility] Jerrol Banana., MD   11 months ago Diabetes mellitus type 2 without retinopathy [facility])   [facility] Jerrol Banana., MD       Future Appointments             In 2 months Jerrol Banana., MD [facility], [facility]             Signed Prescriptions Disp Refills   pioglitazone (ACTOS) 15 MG tablet 90 tablet 0    Sig: TAKE 1 TABLET BY MOUTH EVERY DAY      Endocrinology:  Diabetes - Glitazones - pioglitazone Passed - 04/12/2020  4:25 PM      Passed - HBA1C is between 0 and 7.9 and within 180 days    Hgb A1c MFr Bld  Date Value Ref Range Status  03/10/2020 7.4 (H) 4.8 - 5.6 % Final    Comment:    (NOTE) Pre diabetes:          5.7%-6.4% Diabetes:              >6.4% Glycemic control for   <7.0% adults with diabetes           Passed - Valid encounter within last 6 months    Recent Outpatient Visits           1 week ago CVA, old, cognitive deficits   [facility] Rosanna Randy, Retia Passe., MD   1 month ago Cerebrovascular accident (CVA), unspecified mechanism ([place])   [facility] Jerrol Banana., MD   1 month ago Traumatic injury of head, initial encounter   [facility] Jer[facility] MD   8 months ago Diabetes mellitus type 2 without retinopathy [facility])   [facility] Jerrol Banana., MD   11 months ago Diabetes mellitus type 2 without retinopathy [facility])   [facility] Jerrol Banana., MD       Future Appointments             In 2 months Jerrol Banana., MD [facility], [facility]             Refused Prescriptions Disp Refills   pantoprazole (PROTONIX) 40 MG tablet [Pharmacy Med Name: PANTOPRAZOLE SOD DR 40 MG TAB] 30 tablet     Sig: TAKE 1 TABLET BY MOUTH EVERY DAY (STOP OMEPRAZOLE)      Gastroenterology: Proton Pump Inhibitors Passed - 04/12/2020  4:25 PM      Passed - Valid encounter within last 12 months    Recent Outpatient Visits           1 week ago CVA, old, cognitive deficits   [facility] Jerrol Banana., MD   1 month ago Cerebrovascular accident (CVA), unspecified mechanism [facility])   [facility] Jerrol Banana., MD   1 month ago Traumatic injury of head, initial encounter   [facility] Jerrol Banana., MD   8 months ago Diabetes mellitus type 2 without retinopathy [facility])   [facility] Jer[facility] Banana[facility] MD   11 months ago Diabetes mellitus type 2 without retinopathy [facility])   [facility] Jerrol Banana., MD       Future Appointments             In 2 months Jerrol Banana., MD [facility], [facility]

## 2020-04-15 ENCOUNTER — Telehealth: Payer: Self-pay

## 2020-04-15 ENCOUNTER — Other Ambulatory Visit: Payer: Self-pay | Admitting: Family Medicine

## 2020-04-15 NOTE — Telephone Encounter (Signed)
I called and spoke with patient and advised her that Pantoprazole is for heartburn, but Citalopram is for Anxiety. Patient verbalized understanding. Patient states she is completely out of Protonix. She says she was started on Pantoprazole back in May when she was in the hospital for having a stroke. Patient wants to know if she should continue taking it? She has been having diarrhea but doesn't know if the Pantoprazole could be causing this, or the Metformin. Patient was seen by Dr. Rosanna Randy on 03/13/2020 and she was told the Metformin could possibly cause diarrhea. Patient wants to hold off on refilling the Protonix. She says she wants to talk with the pharmacist to see what they think.

## 2020-04-15 NOTE — Telephone Encounter (Signed)
Requested medications are due for refill today?  Uncertain.  Outside Medications need to be reconciled.    Requested medications are on active medication list?  No - Outside medications need to be reconciled.    Last Refill:  Uncertain.    Future visit scheduled?  Yes   Notes to Clinic:   Outside medications need to be reconciled with patient.

## 2020-04-15 NOTE — Telephone Encounter (Signed)
Copied from [place] [id number]. Topic: General - Other >> Apr 15, 2020 11:22 AM Antonieta Iba C wrote: Reason for CRM: pt called in for clarity. Pt says that she was prescribed pantoprazole for heart burn, pt says that she already take something for acid reflux, (citalopram (CELEXA) 20 MG tablet) pt would like to know which should she continue taking? Pt says that she need a refill for medication.   Pharmacy: [id number] [address], [place]  Phone:  [phone] Fax:  [phone]

## 2020-04-16 ENCOUNTER — Ambulatory Visit: Payer: Medicare Other | Admitting: Speech Pathology

## 2020-04-16 ENCOUNTER — Other Ambulatory Visit: Payer: Self-pay

## 2020-04-16 ENCOUNTER — Ambulatory Visit: Payer: Medicare Other | Attending: Family Medicine | Admitting: Speech Pathology

## 2020-04-16 ENCOUNTER — Other Ambulatory Visit: Payer: Self-pay | Admitting: Family Medicine

## 2020-04-16 DIAGNOSIS — F419 Anxiety disorder, unspecified: Secondary | ICD-10-CM

## 2020-04-16 DIAGNOSIS — R41841 Cognitive communication deficit: Secondary | ICD-10-CM | POA: Insufficient documentation

## 2020-04-16 NOTE — Telephone Encounter (Signed)
Requested Prescriptions  Pending Prescriptions Disp Refills  . citalopram (CELEXA) 20 MG tablet [Pharmacy Med Name: CITALOPRAM HBR 20 MG TABLET] 45 tablet 1    Sig: TAKE 1/2 TABLET BY MOUTH EVERY DAY     Psychiatry:  Antidepressants - SSRI Passed - 04/16/2020  1:24 AM      Passed - Valid encounter within last 6 months    Recent Outpatient Visits          2 weeks ago CVA, old, cognitive deficits   [facility] Jerrol Banana., MD   1 month ago Cerebrovascular accident (CVA), unspecified mechanism [facility])   [facility] Jerrol Banana., MD   1 month ago Traumatic injury of head, initial encounter   [facility] Jerrol Banana., MD   9 months ago Diabetes mellitus type 2 without retinopathy [facility])   [facility] Jerrol Banana., MD   11 months ago Diabetes mellitus type 2 without retinopathy [facility])   [facility] Jerrol Banana., MD      Future Appointments            In 2 months Jerrol Banana., MD [facility]

## 2020-04-17 NOTE — Therapy (Signed)
[place] MAIN [facility] SERVICES [address] [place], [place], [address] Phone: [phone]   Fax:  [phone]  Speech Language Pathology Treatment  Patient Details  Name: Anna Williams MRN: [id number] Date of Birth: [birth year] Referring Provider (SLP): Miguel Aschoff   Encounter Date: 04/16/2020   End of Session - 04/17/20 1223    Visit Number 7    Number of Visits 25    Date for SLP Re-Evaluation 06/13/20    Authorization Type [employer] Medicare    Authorization Time Period 03/21/2020 to 06/13/2020    Authorization - Visit Number 7    Progress Note Due on Visit 10    SLP Start Time 1500    SLP Stop Time  1615    SLP Time Calculation (min) 75 min    Activity Tolerance Patient tolerated treatment well           Past Medical History:  Diagnosis Date  . Anxiety and depression   . ASCUS with positive high risk HPV 09/2013  . Diabetes mellitus without complication ([place])    type 2  . Elevated blood sugar   . Family history of breast cancer in mother   . Fibroid uterus   . History of pulmonary embolus (PE)   . Hypertension   . Menopause   . Mild dysplasia of cervix 09/2013   repeat pap done -ecc neg, cerival bx cin 1  . Retinal detachment     Past Surgical History:  Procedure Laterality Date  . anterior neck fusion    . BREAST CYST ASPIRATION Left yrs ago  . CHOLECYSTECTOMY  2010  . DILATION AND CURETTAGE OF UTERUS    . HYSTEROSCOPY    . mole removed  2014    There were no vitals filed for this visit.   Subjective Assessment - 04/17/20 1222    Subjective pt is pleasant, engaged    Patient is accompained by: Family member    Currently in Pain? No/denies                 ADULT SLP TREATMENT - 04/17/20 0001      General Information   Behavior/Cognition Alert;Cooperative;Pleasant mood    HPI Anna Williams is a 74 y.o. female with medical history significant of anxiety disorder, diabetes,  hypertension, hyperlipidemia, history of pulmonary embolism, with recent infarct in the left anterior basal ganglia involving lenticular nucleus and internal capsule (03/10/2020). Per chart, pt exhibited dysarthria which cleared during acute hospitalization. Of note pt fell striking back of head on 03/06/2020 with diagnosis of concussion and altered mental status.      Treatment Provided   Treatment provided Cognitive-Linquistic      Pain Assessment   Pain Assessment No/denies pain      Cognitive-Linquistic Treatment   Treatment focused on Cognition    Skilled Treatment Supervision faded to Mod I for thought organization when pt conveying information about [employer], job opportunities, pt reports forgetting lab appt, Moderate faded to Minimal cues for use of external compensatory memory strategy via [employer].        Assessment / Recommendations / Plan   Plan Continue with current plan of care      Progression Toward Goals   Progression toward goals Progressing toward goals            SLP Education - 04/17/20 1223    Education Details executive cognitive functions    Person(s) Educated Patient;Spouse    Methods  Explanation;Demonstration    Comprehension Verbalized understanding            SLP Short Term Goals - 03/21/20 1642      SLP SHORT TERM GOAL #1   Title Given minimal to moderate cues, pt will sequence semi-complex reasoning problem solving tasks with 80% accuracy.    Baseline no ability for semi-complex reasoning tasks    Time 10    Period --   sessions   Status New      SLP SHORT TERM GOAL #2   Title With minimum to moderate cues, pt will pre-plan problem solving tasks with 80% accuracy.    Baseline unable, new concept    Time 10    Period --   sessions   Status New      SLP SHORT TERM GOAL #3   Title With minimum to moderate cues, pt will demonstrate emergent awareness by self-monitoring for errors during semi-complex problem solving tasks.     Baseline pt unable to self-monitor during evaluation    Time 10    Period --   sessions   Status New            SLP Long Term Goals - 03/21/20 1650      SLP LONG TERM GOAL #1   Title Pt will identify cognitive-communication barriers and participate in developing compensatory strategies.    Baseline new goal    Time 12    Period Weeks    Status New    Target Date 06/13/20      SLP LONG TERM GOAL #2   Title Pt will increase executive cognitive functions to complete complex reasoning tasks.    Baseline moderate deficits in executive functions    Time 10    Period --   sessions   Status New            Plan - 04/17/20 1223    Clinical Impression Statement Skilled treatment session targeted pt cognitive communication goals. SLP facilitated session by eliciting conversation from pt about unknown complex topic of Holiday weekend activities including details regarding party they hosted, general activities, job opportunity and interesting topic from her husband's job. [name] personality shines thru when talking, great prosody and very engaging. Pt able to follow directions for grocery list app and will add given list of items to cart before next session.    Speech Therapy Frequency 2x / week    Duration --   12 weeks   Treatment/Interventions Cognitive reorganization;Patient/family education;SLP instruction and feedback    Potential to Achieve Goals Good    SLP Home Exercise Plan given - add provided list of items to grocery cart in app    Consulted and Agree with Plan of Care Patient;Family member/caregiver    Family Member Consulted pt's husband           Patient will benefit from skilled therapeutic intervention in order to improve the following deficits and impairments:   Cognitive communication deficit    Problem List Patient Active Problem List   Diagnosis Date Noted  . CVA (cerebral vascular accident) ([place]) 03/10/2020  . Vaginal atrophy 04/29/2016  . Fibroid, uterine  04/29/2016  . Pelvic pain in female 04/29/2016  . Hypertension 10/16/2015  . Diabetes mellitus type 2 without retinopathy ([place]) 05/27/2015  . Menopause 04/29/2015  . Fibroids 04/29/2015  . Anxiety 04/29/2015  . Cervical dysplasia 04/29/2015  . History of pulmonary embolus (PE) 04/29/2015  . Family history of breast cancer in mother 04/29/2015  .  Retinal detachment 04/29/2015   Nicholson Starace B. Rutherford Nail M.S., CCC-SLP, [place] Pathologist Rehabilitation Services Office [phone]  Stormy Fabian 04/17/2020, 12:24 PM  [place] MAIN [facility] SERVICES [address] [place], [place], [address] Phone: [phone]   Fax:  [phone]   Name: Anna Williams MRN: [id number] Date of Birth: 06/02/1946

## 2020-04-18 ENCOUNTER — Encounter: Payer: Medicare Other | Admitting: Speech Pathology

## 2020-04-18 ENCOUNTER — Ambulatory Visit: Payer: Medicare Other | Admitting: Speech Pathology

## 2020-04-18 ENCOUNTER — Other Ambulatory Visit: Payer: Self-pay

## 2020-04-18 DIAGNOSIS — R41841 Cognitive communication deficit: Secondary | ICD-10-CM

## 2020-04-18 NOTE — Therapy (Signed)
[place] MAIN [facility] SERVICES [address] [place], [place], [address] Phone: [phone]   Fax:  [phone]  Speech Language Pathology Treatment  Patient Details  Name: Anna Williams MRN: [id number] Date of Birth: 1946/08/25 Referring Provider (SLP): Miguel Aschoff   Encounter Date: 04/18/2020   End of Session - 04/18/20 1552    Visit Number 8    Number of Visits 25    Date for SLP Re-Evaluation 06/13/20    Authorization Type [employer] Medicare    Authorization Time Period 03/21/2020 to 06/13/2020    Authorization - Visit Number 8    Progress Note Due on Visit 10    SLP Start Time 1100    SLP Stop Time  1210    SLP Time Calculation (min) 70 min    Activity Tolerance Patient tolerated treatment well           Past Medical History:  Diagnosis Date  . Anxiety and depression   . ASCUS with positive high risk HPV 09/2013  . Diabetes mellitus without complication ([place])    type 2  . Elevated blood sugar   . Family history of breast cancer in mother   . Fibroid uterus   . History of pulmonary embolus (PE)   . Hypertension   . Menopause   . Mild dysplasia of cervix 09/2013   repeat pap done -ecc neg, cerival bx cin 1  . Retinal detachment     Past Surgical History:  Procedure Laterality Date  . anterior neck fusion    . BREAST CYST ASPIRATION Left yrs ago  . CHOLECYSTECTOMY  2010  . DILATION AND CURETTAGE OF UTERUS    . HYSTEROSCOPY    . mole removed  2014    There were no vitals filed for this visit.   Subjective Assessment - 04/18/20 1550    Subjective pt is pleasant, engaged    Patient is accompained by: Family member    Currently in Pain? No/denies                 ADULT SLP TREATMENT - 04/18/20 0001      General Information   Behavior/Cognition Alert;Cooperative;Pleasant mood    HPI Anna Williams is a 74 y.o. female with medical history significant of anxiety disorder, diabetes,  hypertension, hyperlipidemia, history of pulmonary embolism, with recent infarct in the left anterior basal ganglia involving lenticular nucleus and internal capsule (03/10/2020). Per chart, pt exhibited dysarthria which cleared during acute hospitalization. Of note pt fell striking back of head on 03/06/2020 with diagnosis of concussion and altered mental status.      Treatment Provided   Treatment provided Cognitive-Linquistic      Pain Assessment   Pain Assessment No/denies pain      Cognitive-Linquistic Treatment   Treatment focused on Cognition    Skilled Treatment Maximal cues required to identify salient instructions for novel semi-complex game, moderate cues to problem solve game, moderate cues for awareness of intermittent socially disconnected comments       Assessment / Recommendations / [place] with current plan of care      Progression Toward Goals   Progression toward goals Progressing toward goals            SLP Education - 04/18/20 1551    Education Details executive cognitive functions    Person(s) Educated Patient;Spouse    Methods Explanation;Demonstration;Verbal cues    Comprehension Verbalized understanding  SLP Short Term Goals - 03/21/20 1642      SLP SHORT TERM GOAL #1   Title Given minimal to moderate cues, pt will sequence semi-complex reasoning problem solving tasks with 80% accuracy.    Baseline no ability for semi-complex reasoning tasks    Time 10    Period --   sessions   Status New      SLP SHORT TERM GOAL #2   Title With minimum to moderate cues, pt will pre-plan problem solving tasks with 80% accuracy.    Baseline unable, new concept    Time 10    Period --   sessions   Status New      SLP SHORT TERM GOAL #3   Title With minimum to moderate cues, pt will demonstrate emergent awareness by self-monitoring for errors during semi-complex problem solving tasks.    Baseline pt unable to self-monitor during evaluation      Time 10    Period --   sessions   Status New            SLP Long Term Goals - 03/21/20 1650      SLP LONG TERM GOAL #1   Title Pt will identify cognitive-communication barriers and participate in developing compensatory strategies.    Baseline new goal    Time 12    Period Weeks    Status New    Target Date 06/13/20      SLP LONG TERM GOAL #2   Title Pt will increase executive cognitive functions to complete complex reasoning tasks.    Baseline moderate deficits in executive functions    Time 10    Period --   sessions   Status New            Plan - 04/18/20 1552    Clinical Impression Statement Skilled treatment session targeted pt's cognitive communication goals. SLP facilitated session by having pt read directions to novel semi-complex game. Pt required maximal cues to identify salient directions, moderate cues to follow directions and problem solve game. Additionally, SLP identified subtle moments of tangential yet disconnected social responses. Pt was receptive to feedback.    Speech Therapy Frequency 2x / week    Duration --   12 weeks   Treatment/Interventions Cognitive reorganization;Patient/family education;SLP instruction and feedback    Potential to Achieve Goals Good    SLP Home Exercise Plan given    Consulted and Agree with Plan of Care Patient;Family member/caregiver    Family Member Consulted pt's husband           Patient will benefit from skilled therapeutic intervention in order to improve the following deficits and impairments:   Cognitive communication deficit    Problem List Patient Active Problem List   Diagnosis Date Noted  . CVA (cerebral vascular accident) ([place]) 03/10/2020  . Vaginal atrophy 04/29/2016  . Fibroid, uterine 04/29/2016  . Pelvic pain in female 04/29/2016  . Hypertension 10/16/2015  . Diabetes mellitus type 2 without retinopathy ([place]) 05/27/2015  . Menopause 04/29/2015  . Fibroids 04/29/2015  . Anxiety 04/29/2015  .  Cervical dysplasia 04/29/2015  . History of pulmonary embolus (PE) 04/29/2015  . Family history of breast cancer in mother 04/29/2015  . Retinal detachment 04/29/2015   Aashka Salomone B. Rutherford Nail M.S., CCC-SLP, [place] Pathologist Rehabilitation Services Office [phone]  Stormy Fabian 04/18/2020, 3:53 PM  [place] MAIN [facility] SERVICES [address] [place], [place], [address] Phone: [phone]   Fax:  [phone]  Name: Anna Williams MRN: [id number] Date of Birth: June 26, 1946

## 2020-04-18 NOTE — Telephone Encounter (Signed)
She has had some mild cognitive challenges since she had her stroke recently.  If you can just check with her to make sure she seems to be taking the medications correctly.  Thanks.

## 2020-04-21 ENCOUNTER — Other Ambulatory Visit: Payer: Self-pay

## 2020-04-21 ENCOUNTER — Other Ambulatory Visit: Payer: Self-pay | Admitting: Family Medicine

## 2020-04-21 ENCOUNTER — Ambulatory Visit: Payer: Medicare Other | Admitting: Speech Pathology

## 2020-04-21 DIAGNOSIS — R41841 Cognitive communication deficit: Secondary | ICD-10-CM

## 2020-04-21 NOTE — Telephone Encounter (Signed)
Patient seen in office today. 

## 2020-04-22 NOTE — Therapy (Signed)
[place] MAIN [facility] SERVICES [address] [place], [place], [address] Phone: [phone]   Fax:  [phone]  Speech Language Pathology Treatment  Patient Details  Name: Anna Williams MRN: [id number] Date of Birth: [birth year] Referring Provider (SLP): Miguel Aschoff   Encounter Date: 04/21/2020   End of Session - 04/22/20 0952    Visit Number 9    Number of Visits 25    Date for SLP Re-Evaluation 06/13/20    Authorization Type [employer] Medicare    Authorization Time Period 03/21/2020 to 06/13/2020    Authorization - Visit Number 9    Progress Note Due on Visit 10    SLP Start Time 1400    SLP Stop Time  1500    SLP Time Calculation (min) 60 min    Activity Tolerance Patient tolerated treatment well           Past Medical History:  Diagnosis Date  . Anxiety and depression   . ASCUS with positive high risk HPV 09/2013  . Diabetes mellitus without complication ([place])    type 2  . Elevated blood sugar   . Family history of breast cancer in mother   . Fibroid uterus   . History of pulmonary embolus (PE)   . Hypertension   . Menopause   . Mild dysplasia of cervix 09/2013   repeat pap done -ecc neg, cerival bx cin 1  . Retinal detachment     Past Surgical History:  Procedure Laterality Date  . anterior neck fusion    . BREAST CYST ASPIRATION Left yrs ago  . CHOLECYSTECTOMY  2010  . DILATION AND CURETTAGE OF UTERUS    . HYSTEROSCOPY    . mole removed  2014    There were no vitals filed for this visit.   Subjective Assessment - 04/22/20 0915    Subjective pt is pleasant, engaged    Patient is accompained by: Family member    Currently in Pain? No/denies                 ADULT SLP TREATMENT - 04/22/20 0001      General Information   Behavior/Cognition Alert;Cooperative;Pleasant mood    HPI Anna Williams is a 74 y.o. female with medical history significant of anxiety disorder, diabetes,  hypertension, hyperlipidemia, history of pulmonary embolism, with recent infarct in the left anterior basal ganglia involving lenticular nucleus and internal capsule (03/10/2020). Per chart, pt exhibited dysarthria which cleared during acute hospitalization. Of note pt fell striking back of head on 03/06/2020 with diagnosis of concussion and altered mental status.      Treatment Provided   Treatment provided Cognitive-Linquistic      Pain Assessment   Pain Assessment No/denies pain      Cognitive-Linquistic Treatment   Treatment focused on Cognition    Skilled Treatment Minimal cues to complete complex novel game, much improved over previous session       Assessment / Recommendations / Plan   Plan Continue with current plan of care      Progression Toward Goals   Progression toward goals Progressing toward goals            SLP Education - 04/22/20 0952    Education Details improved cognitive functions    Person(s) Educated Patient;Spouse    Methods Explanation;Demonstration;Verbal cues    Comprehension Verbalized understanding            SLP Short Term Goals - 03/21/20 1642  SLP SHORT TERM GOAL #1   Title Given minimal to moderate cues, pt will sequence semi-complex reasoning problem solving tasks with 80% accuracy.    Baseline no ability for semi-complex reasoning tasks    Time 10    Period --   sessions   Status New      SLP SHORT TERM GOAL #2   Title With minimum to moderate cues, pt will pre-plan problem solving tasks with 80% accuracy.    Baseline unable, new concept    Time 10    Period --   sessions   Status New      SLP SHORT TERM GOAL #3   Title With minimum to moderate cues, pt will demonstrate emergent awareness by self-monitoring for errors during semi-complex problem solving tasks.    Baseline pt unable to self-monitor during evaluation    Time 10    Period --   sessions   Status New            SLP Long Term Goals - 03/21/20 1650      SLP  LONG TERM GOAL #1   Title Pt will identify cognitive-communication barriers and participate in developing compensatory strategies.    Baseline new goal    Time 12    Period Weeks    Status New    Target Date 06/13/20      SLP LONG TERM GOAL #2   Title Pt will increase executive cognitive functions to complete complex reasoning tasks.    Baseline moderate deficits in executive functions    Time 10    Period --   sessions   Status New            Plan - 04/22/20 7619    Clinical Impression Statement Skilled treatment session targeted pt's cognitive communication goals. SLP facilitated session by providing minimal to supervision level cues to complete complex problem-solving game. Pt with independent use of strategies to increase accuracy of problem solving. Pt with continued improvement during each session.    Speech Therapy Frequency 2x / week    Duration --   12 weeks   Treatment/Interventions Cognitive reorganization;Patient/family education;SLP instruction and feedback    Potential to Achieve Goals Good    SLP Home Exercise Plan given    Family Member Consulted pt's husband           Patient will benefit from skilled therapeutic intervention in order to improve the following deficits and impairments:   Cognitive communication deficit    Problem List Patient Active Problem List   Diagnosis Date Noted  . CVA (cerebral vascular accident) ([place]) 03/10/2020  . Vaginal atrophy 04/29/2016  . Fibroid, uterine 04/29/2016  . Pelvic pain in female 04/29/2016  . Hypertension 10/16/2015  . Diabetes mellitus type 2 without retinopathy ([place]) 05/27/2015  . Menopause 04/29/2015  . Fibroids 04/29/2015  . Anxiety 04/29/2015  . Cervical dysplasia 04/29/2015  . History of pulmonary embolus (PE) 04/29/2015  . Family history of breast cancer in mother 04/29/2015  . Retinal detachment 04/29/2015   Trudee Chirino B. Rutherford Nail M.S., CCC-SLP, [place] Pathologist Rehabilitation  Services Office [phone]  Stormy Fabian 04/22/2020, 9:53 AM  [place] MAIN [facility] SERVICES [address] [place], [place], [address] Phone: [phone]   Fax:  [phone]   Name: Anna Williams MRN: [id number] Date of Birth: 1946-09-28

## 2020-04-23 ENCOUNTER — Ambulatory Visit: Payer: Medicare Other | Admitting: Speech Pathology

## 2020-04-23 ENCOUNTER — Other Ambulatory Visit: Payer: Self-pay

## 2020-04-23 DIAGNOSIS — R41841 Cognitive communication deficit: Secondary | ICD-10-CM | POA: Diagnosis not present

## 2020-04-24 NOTE — Therapy (Signed)
[place] MAIN [facility] SERVICES [address] [place], [place], [address] Phone: [phone]   Fax:  [phone]  Speech Language Pathology Treatment  Speech Therapy Progress Note   Dates of reporting period  03/13/2020   to   04/23/2020  Patient Details  Name: Anna Williams MRN: [id number] Date of Birth: 1946/05/29 Referring Provider (SLP): Miguel Aschoff   Encounter Date: 04/23/2020   End of Session - 04/24/20 0831    Visit Number 10    Number of Visits 25    Date for SLP Re-Evaluation 06/13/20    Authorization Type [employer]    Authorization Time Period 03/21/2020 to 06/13/2020    Authorization - Visit Number 10    Progress Note Due on Visit 10    SLP Start Time 1400    SLP Stop Time  1500    SLP Time Calculation (min) 60 min    Activity Tolerance Patient tolerated treatment well           Past Medical History:  Diagnosis Date  . Anxiety and depression   . ASCUS with positive high risk HPV 09/2013  . Diabetes mellitus without complication ([place])    type 2  . Elevated blood sugar   . Family history of breast cancer in mother   . Fibroid uterus   . History of pulmonary embolus (PE)   . Hypertension   . Menopause   . Mild dysplasia of cervix 09/2013   repeat pap done -ecc neg, cerival bx cin 1  . Retinal detachment     Past Surgical History:  Procedure Laterality Date  . anterior neck fusion    . BREAST CYST ASPIRATION Left yrs ago  . CHOLECYSTECTOMY  2010  . DILATION AND CURETTAGE OF UTERUS    . HYSTEROSCOPY    . mole removed  2014    There were no vitals filed for this visit.   Subjective Assessment - 04/24/20 0830    Subjective pt is pleasant, engaged    Patient is accompained by: Family member    Currently in Pain? No/denies                 ADULT SLP TREATMENT - 04/24/20 0001      General Information   Behavior/Cognition Alert;Cooperative;Pleasant mood    HPI Anna Williams  is a 74 y.o. female with medical history significant of anxiety disorder, diabetes, hypertension, hyperlipidemia, history of pulmonary embolism, with recent infarct in the left anterior basal ganglia involving lenticular nucleus and internal capsule (03/10/2020). Per chart, pt exhibited dysarthria which cleared during acute hospitalization. Of note pt fell striking back of head on 03/06/2020 with diagnosis of concussion and altered mental status.      Treatment Provided   Treatment provided Cognitive-Linquistic      Pain Assessment   Pain Assessment No/denies pain      Cognitive-Linquistic Treatment   Treatment focused on Cognition    Skilled Treatment Education provided on progress towards STG and LTGs; Minimal cues to solve complex deductive reasoning puzzles       Assessment / Recommendations / Plan   Plan Continue with current plan of care      Progression Toward Goals   Progression toward goals Progressing toward goals          10TH VISIT PROGRESS NOTE:   Pt has attended all sessions and as a result she has made great progress towards her STG and LTGs. She has met 3  of 3 STGs and 1 of 2 LTGs. Currently she requires minimum to supervision level cues with some independence noted when attempting complex cognitive tasks. Specifically, her ability to successfully implement executive functions have greatly improved as has she emergent awareness.     SLP Education - 04/24/20 0831    Education Details progress towards STG and LTGs    Person(s) Educated Patient;Spouse    Methods Explanation;Demonstration;Verbal cues;Handout    Comprehension Verbalized understanding            SLP Short Term Goals - 04/24/20 7124      SLP SHORT TERM GOAL #1   Title Given minimal to moderate cues, pt will sequence semi-complex reasoning problem solving tasks with 80% accuracy.    Baseline no ability for semi-complex reasoning tasks    Time 10    Period --   sessions   Status Achieved      SLP  SHORT TERM GOAL #2   Title With minimum to moderate cues, pt will pre-plan problem solving tasks with 80% accuracy.    Baseline unable, new concept    Time 10    Period --   sessions   Status Achieved      SLP SHORT TERM GOAL #3   Title With minimum to moderate cues, pt will demonstrate emergent awareness by self-monitoring for errors during semi-complex problem solving tasks.    Baseline pt unable to self-monitor during evaluation    Time 10    Period --   sessions   Status Achieved      SLP SHORT TERM GOAL #4   Title STGs = LTGs d/t short remaining POC    Baseline progressing    Time 10    Period --   sessions   Status New            SLP Long Term Goals - 04/24/20 5809      SLP LONG TERM GOAL #1   Title Pt will identify cognitive-communication barriers and participate in developing compensatory strategies.    Baseline new goal    Time 12    Period Weeks    Status Achieved      SLP LONG TERM GOAL #2   Title Pt will increase executive cognitive functions to complete complex reasoning tasks.    Baseline moderate deficits in executive functions    Time 10    Period --   sessions   Status Partially Met            Plan - 04/24/20 0832    Clinical Impression Statement Skilled treatment session targeted pt's cognitive communication goals. SLP facilitated session by reviewing pt's STG and LTGs, progress towards goals and facilitated generation of new goals for remainder of ST sessions. SLP further facilitated session by providing minimal cues to solve novel complex deductive reasoning puzzles. Pt continues to make good progress.    Speech Therapy Frequency 2x / week    Duration --   12 weeks   Treatment/Interventions Cognitive reorganization;Patient/family education;SLP instruction and feedback    Potential to Achieve Goals Good    SLP Home Exercise Plan given    Consulted and Agree with Plan of Care Patient;Family member/caregiver    Family Member Consulted pt's husband            Patient will benefit from skilled therapeutic intervention in order to improve the following deficits and impairments:   Cognitive communication deficit    Problem List Patient Active Problem List   Diagnosis Date  Noted  . CVA (cerebral vascular accident) ([place]) 03/10/2020  . Vaginal atrophy 04/29/2016  . Fibroid, uterine 04/29/2016  . Pelvic pain in female 04/29/2016  . Hypertension 10/16/2015  . Diabetes mellitus type 2 without retinopathy ([place]) 05/27/2015  . Menopause 04/29/2015  . Fibroids 04/29/2015  . Anxiety 04/29/2015  . Cervical dysplasia 04/29/2015  . History of pulmonary embolus (PE) 04/29/2015  . Family history of breast cancer in mother 04/29/2015  . Retinal detachment 04/29/2015   Vung Kush B. Rutherford Nail M.S., CCC-SLP, [place] Pathologist Rehabilitation Services Office [phone]  Stormy Fabian 04/24/2020, 8:35 AM  [place] MAIN [facility] SERVICES [address] [place], [place], [address] Phone: [phone]   Fax:  [phone]   Name: GYANNA JAREMA MRN: [id number] Date of Birth: May 30, 1946

## 2020-04-25 ENCOUNTER — Encounter: Payer: Medicare Other | Admitting: Speech Pathology

## 2020-04-25 DIAGNOSIS — M5412 Radiculopathy, cervical region: Secondary | ICD-10-CM | POA: Insufficient documentation

## 2020-04-28 ENCOUNTER — Ambulatory Visit: Payer: Medicare Other | Admitting: Speech Pathology

## 2020-04-28 ENCOUNTER — Other Ambulatory Visit: Payer: Self-pay

## 2020-04-28 DIAGNOSIS — R41841 Cognitive communication deficit: Secondary | ICD-10-CM

## 2020-04-28 NOTE — Therapy (Signed)
[place] MAIN [facility] SERVICES [address] [place], [place], [address] Phone: [phone]   Fax:  [phone]  Patient Details  Name: Anna Williams MRN: [id number] Date of Birth: Oct 20, 1945 Referring Provider:  Jerrol Banana.,*  Encounter Date: 04/28/2020   Pt with recent diagnosis of pinched nerve. She is very drowsy d/t taking muscle relaxer. Pt went home.   Cornisha Zetino B. Rutherford Nail M.S., CCC-SLP, [place] Pathologist Rehabilitation Services Office [phone]    Stormy Fabian 04/28/2020, 1:17 PM  [place] MAIN [facility] SERVICES [address] [place], [place], [address] Phone: [phone]   Fax:  [phone]

## 2020-04-30 ENCOUNTER — Ambulatory Visit: Payer: Medicare Other | Admitting: Speech Pathology

## 2020-05-05 ENCOUNTER — Encounter: Payer: Medicare Other | Admitting: Speech Pathology

## 2020-05-05 ENCOUNTER — Other Ambulatory Visit: Payer: Self-pay

## 2020-05-05 ENCOUNTER — Ambulatory Visit: Payer: Medicare Other | Admitting: Speech Pathology

## 2020-05-05 ENCOUNTER — Telehealth: Payer: Self-pay

## 2020-05-05 DIAGNOSIS — R41841 Cognitive communication deficit: Secondary | ICD-10-CM

## 2020-05-05 NOTE — Telephone Encounter (Signed)
Per CRM:  [name] husband is requesting an office visit this week.  He is concerned about some cognitive issues.

## 2020-05-05 NOTE — Progress Notes (Addendum)
[name],acting as a scribe for Wilhemena Durie, MD.,have documented all relevant documentation on the behalf of Wilhemena Durie, MD,as directed by  Wilhemena Durie, MD while in the presence of Wilhemena Durie, MD.  Established patient visit   Patient: Anna Williams   DOB: 1945-12-14   74 y.o. Female  MRN: [id number] Visit Date: 05/08/2020  Today's healthcare provider: Wilhemena Durie, MD   Chief Complaint  Patient presents with  . Memory Issues   Subjective    HPI   Patient presents today in office for ongoing memory issues. Patient has been having some issues with fatigue and patient's spouse believes she may be dealing with some depression. So they would like to know about changing her medication.  Patient is slowly improving but is worried about this.  Her speech is improved dramatically with therapy  She also complains of right thoracic back pain just above the right scapula. Recent cerebrovascular accident (CVA), unspecified mechanism ([place]) From 03/31/2020-Stop Plavix in 2 days.  She is still a little bit slow cognitively and has a little blunted emotional response but is getting back towards her normal state.  Speech is actually good.  Basal ganglion CVA.       Medications: Outpatient Medications Prior to Visit  Medication Sig  . amLODipine (NORVASC) 2.5 MG tablet TAKE 1 TABLET BY MOUTH EVERY DAY  . Apoaequorin (PREVAGEN) 10 MG CAPS Take 10 mg by mouth daily at 12 noon.  Marland Kitchen atorvastatin (LIPITOR) 40 MG tablet TAKE 1 TABLET BY MOUTH EVERY DAY  . Calcium Citrate-Vitamin D (CALCIUM + D PO) Take 1 tablet by mouth daily.  . citalopram (CELEXA) 20 MG tablet TAKE 1/2 TABLET BY MOUTH EVERY DAY  . glucose blood (ONE TOUCH ULTRA TEST) test strip CHECK SUGAR ONCE DAILY  . JARDIANCE 25 MG TABS tablet TAKE 1 TABLET BY MOUTH DAILY  . metFORMIN (GLUCOPHAGE-XR) 500 MG 24 hr tablet TAKE 1 TABLET BY MOUTH EVERY DAY WITH BREAKFAST (Patient taking differently:  Take 500 mg by mouth daily with breakfast. )  . Multiple Vitamins-Minerals (PRESERVISION/LUTEIN PO) Take by mouth daily.   Marland Kitchen omeprazole (PRILOSEC) 20 MG capsule TAKE 1 CAPSULE BY MOUTH EVERY DAY (Patient taking differently: Take 20 mg by mouth daily. )  . ONETOUCH DELICA LANCETS FINE MISC Check sugar once daily  . pioglitazone (ACTOS) 15 MG tablet TAKE 1 TABLET BY MOUTH EVERY DAY   No facility-administered medications prior to visit.    Review of Systems  Constitutional: Negative for appetite change, chills, fatigue and fever.  Respiratory: Negative for chest tightness and shortness of breath.   Cardiovascular: Negative for chest pain and palpitations.  Gastrointestinal: Negative for abdominal pain, nausea and vomiting.  Neurological: Negative for dizziness and weakness.       Objective    BP (!) 134/75 (BP Location: Left Arm, Patient Position: Sitting, Cuff Size: Normal)   Pulse 70   Temp (!) 96.9 F (36.1 C) (Temporal)   Ht 5\' 7"  (1.702 m)   Wt 153 lb (69.4 kg)   BMI 23.96 kg/m     Physical Exam Vitals reviewed.  Constitutional:      Appearance: Normal appearance. She is normal weight.  Eyes:     General: No scleral icterus.    Conjunctiva/sclera: Conjunctivae normal.  Cardiovascular:     Rate and Rhythm: Normal rate and regular rhythm.     Pulses: Normal pulses.     Heart sounds: Normal heart sounds.  Pulmonary:     Effort: Pulmonary effort is normal.     Breath sounds: Normal breath sounds.  Abdominal:     General: Bowel sounds are normal.     Palpations: Abdomen is soft.  Musculoskeletal:     Cervical back: Normal range of motion and neck supple.     Right lower leg: No edema.     Left lower leg: No edema.  Skin:    General: Skin is warm and dry.  Neurological:     General: No focal deficit present.     Mental Status: She is alert and oriented to person, place, and time.  Psychiatric:        Mood and Affect: Mood normal.        Behavior: Behavior normal.         Thought Content: Thought content normal.        Judgment: Judgment normal.     GAD-7 is 5. PHQ-9 is 15  No results found for any visits on 05/08/20.  Assessment & Plan     1. Altered mental status, unspecified altered mental status type Mild cognitive changes since have a stroke.  She is improving.  I think time will continue to improve this.  2. Cerebrovascular accident (CVA), unspecified mechanism ([place]) Speech has improved.  Continue speech therapy.  3. Gastroesophageal reflux disease  - omeprazole (PRILOSEC) 20 MG capsule; Take 1 capsule (20 mg total) by mouth daily.  Dispense: 90 capsule; Refill: 1  4. Thoracic back pain, unspecified back pain laterality, unspecified chronicity  - lidocaine (LIDODERM) 5 %; APPLY PATCH TO AFFECTED AREA WHEN AWAKE.  Dispense: 30 patch; Refill: 11   No follow-ups on file.         Ragena Fiola Cranford Mon, MD  [facility] [phone] (phone) [phone] (fax)  [place]

## 2020-05-05 NOTE — Telephone Encounter (Signed)
Scheduled appt.

## 2020-05-06 ENCOUNTER — Ambulatory Visit: Payer: Medicare Other | Admitting: Neurology

## 2020-05-06 NOTE — Therapy (Signed)
[place] MAIN [facility] SERVICES [address] [place], [place], [address] Phone: [phone]   Fax:  [phone]  Speech Language Pathology Treatment  Patient Details  Name: Anna Williams MRN: [id number] Date of Birth: 1946/02/03 Referring Provider (SLP): Miguel Aschoff   Encounter Date: 05/05/2020   End of Session - 05/06/20 1626    Visit Number 12    Number of Visits 25    Date for SLP Re-Evaluation 06/13/20    Authorization Type [employer] Medicare    Authorization Time Period 03/21/2020 to 06/13/2020    Authorization - Visit Number 2    Progress Note Due on Visit 10    SLP Start Time 1300    SLP Stop Time  1400    SLP Time Calculation (min) 60 min    Activity Tolerance Patient tolerated treatment well           Past Medical History:  Diagnosis Date  . Anxiety and depression   . ASCUS with positive high risk HPV 09/2013  . Diabetes mellitus without complication ([place])    type 2  . Elevated blood sugar   . Family history of breast cancer in mother   . Fibroid uterus   . History of pulmonary embolus (PE)   . Hypertension   . Menopause   . Mild dysplasia of cervix 09/2013   repeat pap done -ecc neg, cerival bx cin 1  . Retinal detachment     Past Surgical History:  Procedure Laterality Date  . anterior neck fusion    . BREAST CYST ASPIRATION Left yrs ago  . CHOLECYSTECTOMY  2010  . DILATION AND CURETTAGE OF UTERUS    . HYSTEROSCOPY    . mole removed  2014    There were no vitals filed for this visit.   Subjective Assessment - 05/06/20 1624    Subjective pt is more fatigued, frustrated by regression in word finding abilities    Patient is accompained by: Family member    Currently in Pain? No/denies                 ADULT SLP TREATMENT - 05/06/20 0001      General Information   Behavior/Cognition Alert;Cooperative;Pleasant mood    HPI Anna Williams is a 74 y.o. female with medical history  significant of anxiety disorder, diabetes, hypertension, hyperlipidemia, history of pulmonary embolism, with recent infarct in the left anterior basal ganglia involving lenticular nucleus and internal capsule (03/10/2020). Per chart, pt exhibited dysarthria which cleared during acute hospitalization. Of note pt fell striking back of head on 03/06/2020 with diagnosis of concussion and altered mental status.      Treatment Provided   Treatment provided Cognitive-Linquistic      Pain Assessment   Pain Assessment No/denies pain      Cognitive-Linquistic Treatment   Treatment focused on Cognition    Skilled Treatment Moderate assistance required for obvious word finding deficits similar to initial sessions.  Minimal assistance required to organize information and/or provide more details when conversing about her son's wedding.      Assessment / Recommendations / Plan   Plan Continue with current plan of care      Progression Toward Goals   Progression toward goals Progressing toward goals            SLP Education - 05/06/20 1626    Education Details Stroke recovery progress    Person(s) Educated Patient;Spouse    Methods Explanation;Demonstration;Verbal cues;Handout  Comprehension Verbalized understanding            SLP Short Term Goals - 04/24/20 3419      SLP SHORT TERM GOAL #1   Title Given minimal to moderate cues, pt will sequence semi-complex reasoning problem solving tasks with 80% accuracy.    Baseline no ability for semi-complex reasoning tasks    Time 10    Period --   sessions   Status Achieved      SLP SHORT TERM GOAL #2   Title With minimum to moderate cues, pt will pre-plan problem solving tasks with 80% accuracy.    Baseline unable, new concept    Time 10    Period --   sessions   Status Achieved      SLP SHORT TERM GOAL #3   Title With minimum to moderate cues, pt will demonstrate emergent awareness by self-monitoring for errors during semi-complex problem  solving tasks.    Baseline pt unable to self-monitor during evaluation    Time 10    Period --   sessions   Status Achieved      SLP SHORT TERM GOAL #4   Title STGs = LTGs d/t short remaining POC    Baseline progressing    Time 10    Period --   sessions   Status New            SLP Long Term Goals - 04/24/20 6222      SLP LONG TERM GOAL #1   Title Pt will identify cognitive-communication barriers and participate in developing compensatory strategies.    Baseline new goal    Time 12    Period Weeks    Status Achieved      SLP LONG TERM GOAL #2   Title Pt will increase executive cognitive functions to complete complex reasoning tasks.    Baseline moderate deficits in executive functions    Time 10    Period --   sessions   Status Partially Met            Plan - 05/06/20 1627    Clinical Impression Statement Skilled treatment session focused on pt's cognitive communication goals. SLP facilitated session by providing moderate assistance for word finding. [name] expressive language had been free of these difficulties for several weeks and this appears to be a mild regression. [name] word finding deficits distracted from her ability to communicate semi-complex thoughts. She and her husband state that they have had a lot of demands over the last several weeks, pt is increasingly more fatigued and pt asked "is this depression or am I getting worse?" Education provided on pt's great progress up until this point with prognosis still considered great. Encouraged pt to seek appointment with her PCP for further differential diagnosis.    Speech Therapy Frequency 2x / week    Duration --   12 weeks   Treatment/Interventions Cognitive reorganization;Patient/family education;SLP instruction and feedback    Potential to Achieve Goals Good    SLP Home Exercise Plan given    Consulted and Agree with Plan of Care Patient;Family member/caregiver    Family Member Consulted pt's husband             Patient will benefit from skilled therapeutic intervention in order to improve the following deficits and impairments:   Cognitive communication deficit    Problem List Patient Active Problem List   Diagnosis Date Noted  . CVA (cerebral vascular accident) ([place]) 03/10/2020  . Vaginal atrophy 04/29/2016  .  Fibroid, uterine 04/29/2016  . Pelvic pain in female 04/29/2016  . Hypertension 10/16/2015  . Diabetes mellitus type 2 without retinopathy ([place]) 05/27/2015  . Menopause 04/29/2015  . Fibroids 04/29/2015  . Anxiety 04/29/2015  . Cervical dysplasia 04/29/2015  . History of pulmonary embolus (PE) 04/29/2015  . Family history of breast cancer in mother 04/29/2015  . Retinal detachment 04/29/2015   Lamarkus Nebel B. Rutherford Nail M.S., CCC-SLP, [place] Pathologist Rehabilitation Services Office [phone]  Stormy Fabian 05/06/2020, 4:28 PM  [place] MAIN [facility] SERVICES [address] [place], [place], [address] Phone: [phone]   Fax:  [phone]   Name: GENEIVE SANDSTROM MRN: [id number] Date of Birth: 09/03/1946

## 2020-05-07 ENCOUNTER — Other Ambulatory Visit: Payer: Self-pay

## 2020-05-07 ENCOUNTER — Other Ambulatory Visit: Payer: Self-pay | Admitting: Family Medicine

## 2020-05-07 ENCOUNTER — Encounter: Payer: Medicare Other | Admitting: Speech Pathology

## 2020-05-07 ENCOUNTER — Ambulatory Visit: Payer: Medicare Other | Admitting: Speech Pathology

## 2020-05-07 DIAGNOSIS — R41841 Cognitive communication deficit: Secondary | ICD-10-CM

## 2020-05-08 ENCOUNTER — Ambulatory Visit (INDEPENDENT_AMBULATORY_CARE_PROVIDER_SITE_OTHER): Payer: Medicare Other | Admitting: Family Medicine

## 2020-05-08 ENCOUNTER — Encounter: Payer: Self-pay | Admitting: Family Medicine

## 2020-05-08 VITALS — BP 134/75 | HR 70 | Temp 96.9°F | Ht 67.0 in | Wt 153.0 lb

## 2020-05-08 DIAGNOSIS — R4182 Altered mental status, unspecified: Secondary | ICD-10-CM

## 2020-05-08 DIAGNOSIS — M546 Pain in thoracic spine: Secondary | ICD-10-CM | POA: Diagnosis not present

## 2020-05-08 DIAGNOSIS — K219 Gastro-esophageal reflux disease without esophagitis: Secondary | ICD-10-CM | POA: Diagnosis not present

## 2020-05-08 DIAGNOSIS — I639 Cerebral infarction, unspecified: Secondary | ICD-10-CM | POA: Diagnosis not present

## 2020-05-08 MED ORDER — OMEPRAZOLE 20 MG PO CPDR: 20.0000 mg | DELAYED_RELEASE_CAPSULE | Freq: Every day | ORAL | 1 refills | Status: DC

## 2020-05-08 MED ORDER — LIDOCAINE 5 % EX PTCH: MEDICATED_PATCH | CUTANEOUS | 11 refills | Status: DC

## 2020-05-08 NOTE — Therapy (Signed)
[place] MAIN [facility] SERVICES [address] [place], [place], [address] Phone: [phone]   Fax:  [phone]  Speech Language Pathology Treatment  Patient Details  Name: Anna Williams MRN: [id number] Date of Birth: 06/02/1946 Referring Provider (SLP): Miguel Aschoff   Encounter Date: 05/07/2020   End of Session - 05/08/20 1234    Visit Number 13    Number of Visits 25    Date for SLP Re-Evaluation 06/13/20    Authorization Type [employer]    Authorization Time Period 03/21/2020 to 06/13/2020    Authorization - Visit Number 3    Progress Note Due on Visit 10    SLP Start Time 1300    SLP Stop Time  1400    SLP Time Calculation (min) 60 min    Activity Tolerance Patient tolerated treatment well           Past Medical History:  Diagnosis Date  . Anxiety and depression   . ASCUS with positive high risk HPV 09/2013  . Diabetes mellitus without complication ([place])    type 2  . Elevated blood sugar   . Family history of breast cancer in mother   . Fibroid uterus   . History of pulmonary embolus (PE)   . Hypertension   . Menopause   . Mild dysplasia of cervix 09/2013   repeat pap done -ecc neg, cerival bx cin 1  . Retinal detachment     Past Surgical History:  Procedure Laterality Date  . anterior neck fusion    . BREAST CYST ASPIRATION Left yrs ago  . CHOLECYSTECTOMY  2010  . DILATION AND CURETTAGE OF UTERUS    . HYSTEROSCOPY    . mole removed  2014    There were no vitals filed for this visit.   Subjective Assessment - 05/08/20 1233    Subjective pt is more fatigued, frustrated by regression in word finding abilities    Patient is accompained by: Family member    Currently in Pain? No/denies              ADULT SLP TREATMENT - 05/08/20 0001      General Information   Behavior/Cognition Alert;Cooperative;Pleasant mood    HPI Anna Williams is a 74 y.o. female with medical history significant  of anxiety disorder, diabetes, hypertension, hyperlipidemia, history of pulmonary embolism, with recent infarct in the left anterior basal ganglia involving lenticular nucleus and internal capsule (03/10/2020). Per chart, pt exhibited dysarthria which cleared during acute hospitalization. Of note pt fell striking back of head on 03/06/2020 with diagnosis of concussion and altered mental status.      Treatment Provided   Treatment provided Cognitive-Linquistic      Pain Assessment   Pain Assessment No/denies pain      Cognitive-Linquistic Treatment   Treatment focused on Cognition    Skilled Treatment Cognistat administered d/t noticeable acute memory deficits. See below for Cognitive Status Profile.        Assessment / Recommendations / Plan   Plan Continue with current plan of care      Progression Toward Goals   Progression toward goals Progressing toward goals            SLP Education - 05/08/20 1234    Education Details results of Cognistat    Person(s) Educated Patient;Spouse    Methods Explanation;Demonstration;Verbal cues;Handout    Comprehension Verbalized understanding            SLP Short  Term Goals - 04/24/20 7169      SLP SHORT TERM GOAL #1   Title Given minimal to moderate cues, pt will sequence semi-complex reasoning problem solving tasks with 80% accuracy.    Baseline no ability for semi-complex reasoning tasks    Time 10    Period --   sessions   Status Achieved      SLP SHORT TERM GOAL #2   Title With minimum to moderate cues, pt will pre-plan problem solving tasks with 80% accuracy.    Baseline unable, new concept    Time 10    Period --   sessions   Status Achieved      SLP SHORT TERM GOAL #3   Title With minimum to moderate cues, pt will demonstrate emergent awareness by self-monitoring for errors during semi-complex problem solving tasks.    Baseline pt unable to self-monitor during evaluation    Time 10    Period --   sessions   Status Achieved       SLP SHORT TERM GOAL #4   Title STGs = LTGs d/t short remaining POC    Baseline progressing    Time 10    Period --   sessions   Status New            SLP Long Term Goals - 04/24/20 6789      SLP LONG TERM GOAL #1   Title Pt will identify cognitive-communication barriers and participate in developing compensatory strategies.    Baseline new goal    Time 12    Period Weeks    Status Achieved      SLP LONG TERM GOAL #2   Title Pt will increase executive cognitive functions to complete complex reasoning tasks.    Baseline moderate deficits in executive functions    Time 10    Period --   sessions   Status Partially Met            Plan - 05/08/20 1234    Clinical Impression Statement The Cognistat was administered during today's session to formally assess for acute deficits in memory and word finding. Pt obtained average scores in level of consciousness, orientation, language construction, and reasoning with mild score achieved in attention, calculations, and moderate impairment in memory. This does align with recent worsened deficits in memory that results in difficulty with word finding. Pt has appointment with PCP on 05/08/2020.    Cognistat is a domain specific test instrument for patients ages 61 to 12, that provides individual scores in the five major cognitive domains:  language, spatial skills, memory, calculations, and reasoning. Scores range from Average to Severe.   COGNITIVE STATUS PROFILE   Level of Consciousness: Alert   Orientation: Average   Attention:  Mild   Language:  Average   Constructions:  Average   Memory: Moderate   Calculations: Mild   Reasoning: Average       Speech Therapy Frequency 2x / week    Duration --   12 weeks   Treatment/Interventions Cognitive reorganization;Patient/family education;SLP instruction and feedback    Potential to Achieve Goals Good    SLP Home Exercise Plan given    Consulted and Agree with Plan of Care  Patient;Family member/caregiver    Family Member Consulted pt's husband           Patient will benefit from skilled therapeutic intervention in order to improve the following deficits and impairments:   Cognitive communication deficit    Problem List  Patient Active Problem List   Diagnosis Date Noted  . CVA (cerebral vascular accident) ([place]) 03/10/2020  . Vaginal atrophy 04/29/2016  . Fibroid, uterine 04/29/2016  . Pelvic pain in female 04/29/2016  . Hypertension 10/16/2015  . Diabetes mellitus type 2 without retinopathy ([place]) 05/27/2015  . Menopause 04/29/2015  . Fibroids 04/29/2015  . Anxiety 04/29/2015  . Cervical dysplasia 04/29/2015  . History of pulmonary embolus (PE) 04/29/2015  . Family history of breast cancer in mother 04/29/2015  . Retinal detachment 04/29/2015   Arvid Marengo B. Rutherford Nail M.S., CCC-SLP, [place] Pathologist Rehabilitation Services Office [phone]  Stormy Fabian 05/08/2020, 12:35 PM  [place] MAIN [facility] SERVICES [address] [place], [place], [address] Phone: [phone]   Fax:  [phone]   Name: Anna Williams MRN: [id number] Date of Birth: 25-Sep-1946

## 2020-05-08 NOTE — Patient Instructions (Addendum)
Increase Celexa 20 MG to 1 tablet daily!!!   Vitamin E ointment for sunburn!!  Stop pantoprazole!!!

## 2020-05-12 ENCOUNTER — Other Ambulatory Visit: Payer: Self-pay

## 2020-05-12 ENCOUNTER — Ambulatory Visit: Payer: Medicare Other | Attending: Family Medicine | Admitting: Speech Pathology

## 2020-05-12 DIAGNOSIS — R41841 Cognitive communication deficit: Secondary | ICD-10-CM | POA: Insufficient documentation

## 2020-05-13 ENCOUNTER — Ambulatory Visit: Payer: Medicare Other | Admitting: Speech Pathology

## 2020-05-13 NOTE — Therapy (Signed)
[facility] SERVICES [address] [place], [place], [address] Phone: [phone]   Fax:  [phone]  Speech Language Pathology Treatment  Patient Details  Name: Anna Williams MRN: [id number] Date of Birth: October 15, 1945 Referring Provider (SLP): Anna Williams   Encounter Date: 05/12/2020   End of Session - 05/13/20 1532    Visit Number 14    Number of Visits 25    Date for SLP Re-Evaluation 06/13/20    Authorization Type [employer]    Authorization Time Period 03/21/2020 to 06/13/2020    Authorization - Visit Number 4    Progress Note Due on Visit 10    SLP Start Time 1300    SLP Stop Time  1400    SLP Time Calculation (min) 60 min    Activity Tolerance Patient tolerated treatment well           Past Medical History:  Diagnosis Date  . Anxiety and depression   . ASCUS with positive high risk HPV 09/2013  . Diabetes mellitus without complication ([place])    type 2  . Elevated blood sugar   . Family history of breast cancer in mother   . Fibroid uterus   . History of pulmonary embolus (PE)   . Hypertension   . Menopause   . Mild dysplasia of cervix 09/2013   repeat pap done -ecc neg, cerival bx cin 1  . Retinal detachment     Past Surgical History:  Procedure Laterality Date  . anterior neck fusion    . BREAST CYST ASPIRATION Left yrs ago  . CHOLECYSTECTOMY  2010  . DILATION AND CURETTAGE OF UTERUS    . HYSTEROSCOPY    . mole removed  2014    There were no vitals filed for this visit.   Subjective Assessment - 05/13/20 1531    Subjective pt struggling with recovery process    Patient is accompained by: Family member    Currently in Pain? No/denies                 ADULT SLP TREATMENT - 05/13/20 0001      General Information   Behavior/Cognition Alert;Cooperative;Pleasant mood    HPI Anna Williams is a 74 y.o. female with medical history significant of anxiety disorder, diabetes,  hypertension, hyperlipidemia, history of pulmonary embolism, with recent infarct in the left anterior basal ganglia involving lenticular nucleus and internal capsule (03/10/2020). Per chart, pt exhibited dysarthria which cleared during acute hospitalization. Of note pt fell striking back of head on 03/06/2020 with diagnosis of concussion and altered mental status.      Treatment Provided   Treatment provided Cognitive-Linquistic      Pain Assessment   Pain Assessment No/denies pain      Cognitive-Linquistic Treatment   Treatment focused on Cognition    Skilled Treatment Minimal cues from husband to cohesively recall information from doctor's appt on previous week. Education provided on stroke recovery and strategies created with pt and her husband to recall information about strokes and the recovery process.        Assessment / Recommendations / Plan   Plan Continue with current plan of care      Progression Toward Goals   Progression toward goals Progressing toward goals            SLP Education - 05/13/20 1532    Education Details statistics of stroke recovery and emotional response to recovery process    Person(s)  Educated Patient;Spouse    Methods Explanation;Demonstration;Verbal cues;Handout    Comprehension Verbalized understanding            SLP Short Term Goals - 04/24/20 3007      SLP SHORT TERM GOAL #1   Title Given minimal to moderate cues, pt will sequence semi-complex reasoning problem solving tasks with 80% accuracy.    Baseline no ability for semi-complex reasoning tasks    Time 10    Period --   sessions   Status Achieved      SLP SHORT TERM GOAL #2   Title With minimum to moderate cues, pt will pre-plan problem solving tasks with 80% accuracy.    Baseline unable, new concept    Time 10    Period --   sessions   Status Achieved      SLP SHORT TERM GOAL #3   Title With minimum to moderate cues, pt will demonstrate emergent awareness by self-monitoring for  errors during semi-complex problem solving tasks.    Baseline pt unable to self-monitor during evaluation    Time 10    Period --   sessions   Status Achieved      SLP SHORT TERM GOAL #4   Title STGs = LTGs d/t short remaining POC    Baseline progressing    Time 10    Period --   sessions   Status New            SLP Long Term Goals - 04/24/20 6226      SLP LONG TERM GOAL #1   Title Pt will identify cognitive-communication barriers and participate in developing compensatory strategies.    Baseline new goal    Time 12    Period Weeks    Status Achieved      SLP LONG TERM GOAL #2   Title Pt will increase executive cognitive functions to complete complex reasoning tasks.    Baseline moderate deficits in executive functions    Time 10    Period --   sessions   Status Partially Met            Plan - 05/13/20 1533    Clinical Impression Statement Skilled treatment session focused on pt's cognitive communication goals. SLP facilitated session by providing extensive education on stroke, statistics regarding recovery as well as frequent emotional responses to the recovery process within the stroke population.    Speech Therapy Frequency 2x / week    Duration --   12 weeks   Treatment/Interventions Cognitive reorganization;Patient/family education;SLP instruction and feedback    Potential to Achieve Goals Good    SLP Home Exercise Plan given    Consulted and Agree with Plan of Care Patient;Family member/caregiver    Family Member Consulted pt's husband           Patient will benefit from skilled therapeutic intervention in order to improve the following deficits and impairments:   Cognitive communication deficit    Problem List Patient Active Problem List   Diagnosis Date Noted  . CVA (cerebral vascular accident) ([place]) 03/10/2020  . Vaginal atrophy 04/29/2016  . Fibroid, uterine 04/29/2016  . Pelvic pain in female 04/29/2016  . Hypertension 10/16/2015  . Diabetes  mellitus type 2 without retinopathy ([place]) 05/27/2015  . Menopause 04/29/2015  . Fibroids 04/29/2015  . Anxiety 04/29/2015  . Cervical dysplasia 04/29/2015  . History of pulmonary embolus (PE) 04/29/2015  . Family history of breast cancer in mother 04/29/2015  . Retinal detachment 04/29/2015  Anna Williams M.S., CCC-SLP, [place] Pathologist Rehabilitation Services Office [phone]  Stormy Fabian 05/13/2020, 3:34 PM  [place] MAIN [facility] SERVICES [address] [place], [place], [address] Phone: [phone]   Fax:  [phone]   Name: Anna Williams MRN: [id number] Date of Birth: 27-Jan-1946

## 2020-05-14 ENCOUNTER — Telehealth: Payer: Self-pay

## 2020-05-14 NOTE — Telephone Encounter (Signed)
Copied from [place] [id number]. Topic: Appointment Scheduling - Scheduling Inquiry for Clinic >> May 14, 2020  2:48 PM [name] wrote: Reason for CRM: Pt would like approval for B12 shot from Dr. Rosanna Randy and to schedule an appt for it/ please advise

## 2020-05-16 ENCOUNTER — Other Ambulatory Visit: Payer: Self-pay

## 2020-05-16 ENCOUNTER — Ambulatory Visit: Payer: Medicare Other | Admitting: Speech Pathology

## 2020-05-16 DIAGNOSIS — R41841 Cognitive communication deficit: Secondary | ICD-10-CM | POA: Diagnosis not present

## 2020-05-16 NOTE — Therapy (Signed)
[place] MAIN [facility] SERVICES [address] [place], [place], [address] Phone: [phone]   Fax:  [phone]  Speech Language Pathology Treatment  Patient Details  Name: Anna Williams MRN: [id number] Date of Birth: 05-20-1946 Referring Provider (SLP): Miguel Aschoff   Encounter Date: 05/16/2020   End of Session - 05/16/20 1124    Visit Number 15    Number of Visits 25    Date for SLP Re-Evaluation 06/13/20    Authorization Type [employer]    Authorization Time Period 03/21/2020 to 06/13/2020    Authorization - Visit Number 5    Progress Note Due on Visit 10    SLP Start Time 1000    SLP Stop Time  1100    SLP Time Calculation (min) 60 min    Activity Tolerance Patient tolerated treatment well           Past Medical History:  Diagnosis Date  . Anxiety and depression   . ASCUS with positive high risk HPV 09/2013  . Diabetes mellitus without complication ([place])    type 2  . Elevated blood sugar   . Family history of breast cancer in mother   . Fibroid uterus   . History of pulmonary embolus (PE)   . Hypertension   . Menopause   . Mild dysplasia of cervix 09/2013   repeat pap done -ecc neg, cerival bx cin 1  . Retinal detachment     Past Surgical History:  Procedure Laterality Date  . anterior neck fusion    . BREAST CYST ASPIRATION Left yrs ago  . CHOLECYSTECTOMY  2010  . DILATION AND CURETTAGE OF UTERUS    . HYSTEROSCOPY    . mole removed  2014    There were no vitals filed for this visit.   Subjective Assessment - 05/16/20 1109    Subjective Pt appeared in better spirits    Patient is accompained by: Family member    Currently in Pain? No/denies                 ADULT SLP TREATMENT - 05/16/20 0001      General Information   Behavior/Cognition Alert;Cooperative;Pleasant mood    HPI Anna Williams is a 74 y.o. female with medical history significant of anxiety disorder, diabetes,  hypertension, hyperlipidemia, history of pulmonary embolism, with recent infarct in the left anterior basal ganglia involving lenticular nucleus and internal capsule (03/10/2020). Per chart, pt exhibited dysarthria which cleared during acute hospitalization. Of note pt fell striking back of head on 03/06/2020 with diagnosis of concussion and altered mental status.      Treatment Provided   Treatment provided Cognitive-Linquistic      Cognitive-Linquistic Treatment   Treatment focused on Cognition    Skilled Treatment "Rehearsal" Memory Strategy introduced: very effective strategy, Minimal assistance to utilize strategy       Assessment / Recommendations / [place] with current plan of care      Progression Toward Goals   Progression toward goals Progressing toward goals            SLP Education - 05/16/20 1124    Education Details memory strategies    Person(s) Educated Patient;Spouse    Methods Explanation;Demonstration;Verbal cues;Handout    Comprehension Verbalized understanding            SLP Short Term Goals - 04/24/20 7209      SLP SHORT TERM GOAL #1   Title Given  minimal to moderate cues, pt will sequence semi-complex reasoning problem solving tasks with 80% accuracy.    Baseline no ability for semi-complex reasoning tasks    Time 10    Period --   sessions   Status Achieved      SLP SHORT TERM GOAL #2   Title With minimum to moderate cues, pt will pre-plan problem solving tasks with 80% accuracy.    Baseline unable, new concept    Time 10    Period --   sessions   Status Achieved      SLP SHORT TERM GOAL #3   Title With minimum to moderate cues, pt will demonstrate emergent awareness by self-monitoring for errors during semi-complex problem solving tasks.    Baseline pt unable to self-monitor during evaluation    Time 10    Period --   sessions   Status Achieved      SLP SHORT TERM GOAL #4   Title STGs = LTGs d/t short remaining POC    Baseline  progressing    Time 10    Period --   sessions   Status New            SLP Long Term Goals - 04/24/20 4132      SLP LONG TERM GOAL #1   Title Pt will identify cognitive-communication barriers and participate in developing compensatory strategies.    Baseline new goal    Time 12    Period Weeks    Status Achieved      SLP LONG TERM GOAL #2   Title Pt will increase executive cognitive functions to complete complex reasoning tasks.    Baseline moderate deficits in executive functions    Time 10    Period --   sessions   Status Partially Met            Plan - 05/16/20 1125    Clinical Impression Statement Skilled treatment session focused on pt's cognitive communication goals. SLP facilitated session by introducing "rehearsal" as a memory strategy. Pt able to understand and begin utilizing strategy. She found this to be extremely helpful in recall. Overall minimal assistance needed to use every time. Pt with much improved self-awareness and self-correcting. SLP further facilitated session by introducing novel semi-complex card game [facility]). Pt able to complete with moderate independence and good use of "rehearsal" to retain the directions.    Speech Therapy Frequency 2x / week    Duration --   12 weeks   Treatment/Interventions Cognitive reorganization;Patient/family education;SLP instruction and feedback    Potential to Achieve Goals Good    Consulted and Agree with Plan of Care Patient;Family member/caregiver    Family Member Consulted pt's husband           Patient will benefit from skilled therapeutic intervention in order to improve the following deficits and impairments:   Cognitive communication deficit    Problem List Patient Active Problem List   Diagnosis Date Noted  . CVA (cerebral vascular accident) ([place]) 03/10/2020  . Vaginal atrophy 04/29/2016  . Fibroid, uterine 04/29/2016  . Pelvic pain in female 04/29/2016  . Hypertension 10/16/2015  . Diabetes  mellitus type 2 without retinopathy ([place]) 05/27/2015  . Menopause 04/29/2015  . Fibroids 04/29/2015  . Anxiety 04/29/2015  . Cervical dysplasia 04/29/2015  . History of pulmonary embolus (PE) 04/29/2015  . Family history of breast cancer in mother 04/29/2015  . Retinal detachment 04/29/2015   Anna Williams B. Rutherford Nail, M.S., [place], [place] Pathologist Rehabilitation Services  Office [place] 05/16/2020, 11:26 AM  [place] MAIN [facility] SERVICES [address] [place], [place], [address] Phone: [phone]   Fax:  [phone]   Name: Anna Williams MRN: [id number] Date of Birth: May 24, 1946

## 2020-05-20 ENCOUNTER — Other Ambulatory Visit: Payer: Self-pay

## 2020-05-20 ENCOUNTER — Ambulatory Visit: Payer: Medicare Other | Admitting: Speech Pathology

## 2020-05-20 DIAGNOSIS — R41841 Cognitive communication deficit: Secondary | ICD-10-CM | POA: Diagnosis not present

## 2020-05-21 ENCOUNTER — Encounter: Payer: Self-pay | Admitting: Speech Pathology

## 2020-05-21 NOTE — Therapy (Signed)
[place] MAIN [facility] SERVICES [address] [place], [place], [address] Phone: [phone]   Fax:  [phone]  Speech Language Pathology Treatment  Patient Details  Name: Anna Williams MRN: [id number] Date of Birth: 18-Sep-1946 Referring Provider (SLP): Miguel Aschoff   Encounter Date: 05/20/2020   End of Session - 05/21/20 0827    Visit Number 16    Number of Visits 25    Date for SLP Re-Evaluation 06/13/20    Authorization Type [employer]    Authorization Time Period 03/21/2020 to 06/13/2020    Authorization - Visit Number 6    Progress Note Due on Visit 10    SLP Start Time 1000    SLP Stop Time  1100    SLP Time Calculation (min) 60 min    Activity Tolerance Patient tolerated treatment well           Past Medical History:  Diagnosis Date   Anxiety and depression    ASCUS with positive high risk HPV 09/2013   Diabetes mellitus without complication ([place])    type 2   Elevated blood sugar    Family history of breast cancer in mother    Fibroid uterus    History of pulmonary embolus (PE)    Hypertension    Menopause    Mild dysplasia of cervix 09/2013   repeat pap done -ecc neg, cerival bx cin 1   Retinal detachment     Past Surgical History:  Procedure Laterality Date   anterior neck fusion     BREAST CYST ASPIRATION Left yrs ago   CHOLECYSTECTOMY  2010   [place]     HYSTEROSCOPY     mole removed  2014    There were no vitals filed for this visit.   Subjective Assessment - 05/21/20 0825    Subjective pt continues to be social and eager to learn information within session    Currently in Pain? No/denies                 ADULT SLP TREATMENT - 05/21/20 0001      General Information   Behavior/Cognition Alert;Cooperative;Pleasant mood    HPI Anna Williams is a 74 y.o. female with medical history significant of anxiety disorder, diabetes,  hypertension, hyperlipidemia, history of pulmonary embolism, with recent infarct in the left anterior basal ganglia involving lenticular nucleus and internal capsule (03/10/2020). Per chart, pt exhibited dysarthria which cleared during acute hospitalization. Of note pt fell striking back of head on 03/06/2020 with diagnosis of concussion and altered mental status.      Treatment Provided   Treatment provided Cognitive-Linquistic      Cognitive-Linquistic Treatment   Treatment focused on Cognition    Skilled Treatment Total Multimodal assistance to use rehearsal strategy accurately, writing down information to rehearse is helpful       Assessment / Recommendations / Plan   Plan Continue with current plan of care      Progression Toward Goals   Progression toward goals Progressing toward goals            SLP Education - 05/21/20 0826    Education Details memory strategies    Person(s) Educated Patient;Spouse    Methods Explanation;Demonstration;Verbal cues    Comprehension Verbalized understanding            SLP Short Term Goals - 04/24/20 0832      SLP SHORT TERM GOAL #1   Title  Given minimal to moderate cues, pt will sequence semi-complex reasoning problem solving tasks with 80% accuracy.    Baseline no ability for semi-complex reasoning tasks    Time 10    Period --   sessions   Status Achieved      SLP SHORT TERM GOAL #2   Title With minimum to moderate cues, pt will pre-plan problem solving tasks with 80% accuracy.    Baseline unable, new concept    Time 10    Period --   sessions   Status Achieved      SLP SHORT TERM GOAL #3   Title With minimum to moderate cues, pt will demonstrate emergent awareness by self-monitoring for errors during semi-complex problem solving tasks.    Baseline pt unable to self-monitor during evaluation    Time 10    Period --   sessions   Status Achieved      SLP SHORT TERM GOAL #4   Title STGs = LTGs d/t short remaining POC     Baseline progressing    Time 10    Period --   sessions   Status New            SLP Long Term Goals - 04/24/20 7829      SLP LONG TERM GOAL #1   Title Pt will identify cognitive-communication barriers and participate in developing compensatory strategies.    Baseline new goal    Time 12    Period Weeks    Status Achieved      SLP LONG TERM GOAL #2   Title Pt will increase executive cognitive functions to complete complex reasoning tasks.    Baseline moderate deficits in executive functions    Time 10    Period --   sessions   Status Partially Met            Plan - 05/21/20 0827    Clinical Impression Statement Specifically, she was not able to rehearse key words. She continued to substitute previous information, or she changed information. Writing down the key words was helpful in accurately rehearsing the correct information. However, when cognitive load increased and she was required to write down constraint (order from smallest to largest), pts accuracy significantly decreased. She required moderate faded to minimal assistance for recall of card game TRASH.    Speech Therapy Frequency 2x / week    Duration --   12 weeks   Treatment/Interventions Cognitive reorganization;Patient/family education;SLP instruction and feedback    Potential to Achieve Goals Good    SLP Home Exercise Plan given    Consulted and Agree with Plan of Care Patient           Patient will benefit from skilled therapeutic intervention in order to improve the following deficits and impairments:   Cognitive communication deficit    Problem List Patient Active Problem List   Diagnosis Date Noted   CVA (cerebral vascular accident) ([place]) 03/10/2020   Vaginal atrophy 04/29/2016   Fibroid, uterine 04/29/2016   Pelvic pain in female 04/29/2016   Hypertension 10/16/2015   Diabetes mellitus type 2 without retinopathy ([place]) 05/27/2015   Menopause 04/29/2015   Fibroids 04/29/2015    Anxiety 04/29/2015   Cervical dysplasia 04/29/2015   History of pulmonary embolus (PE) 04/29/2015   Family history of breast cancer in mother 04/29/2015   Retinal detachment 04/29/2015   Jarelle Ates B. Rutherford Nail M.S., CCC-SLP, [place] Office [phone]  Stormy Fabian 05/21/2020, 8:28 AM  [place]  [facility] MAIN [facility] SERVICES [place], [place], [address] Phone: [phone]   Fax:  [phone]   Name: Anna Williams MRN: [id number] Date of Birth: 08/20/1946

## 2020-05-21 NOTE — Telephone Encounter (Signed)
Patient is requesting status of b12 shots. Call back [phone]

## 2020-05-22 NOTE — Telephone Encounter (Signed)
We have not checked a B12 level.  That would be the best option is to check a B12 level and see if she is B12 deficient.  We can give a B12 shot but it would not be covered.

## 2020-05-22 NOTE — Telephone Encounter (Signed)
Patient advised.

## 2020-05-23 ENCOUNTER — Encounter: Payer: Self-pay | Admitting: Speech Pathology

## 2020-05-23 ENCOUNTER — Ambulatory Visit: Payer: Medicare Other | Admitting: Speech Pathology

## 2020-05-23 ENCOUNTER — Other Ambulatory Visit: Payer: Self-pay

## 2020-05-23 DIAGNOSIS — R41841 Cognitive communication deficit: Secondary | ICD-10-CM | POA: Diagnosis not present

## 2020-05-23 NOTE — Therapy (Signed)
[place] MAIN [facility] SERVICES [address] [place], [place], [address] Phone: [phone]   Fax:  [phone]  Speech Language Pathology Treatment  Patient Details  Name: Anna Williams MRN: [id number] Date of Birth: 28-Sep-1946 Referring Provider (SLP): Miguel Aschoff   Encounter Date: 05/23/2020   End of Session - 05/23/20 1526    Visit Number 17    Number of Visits 25    Date for SLP Re-Evaluation 06/13/20    Authorization Type [employer]    Authorization Time Period 03/21/2020 to 06/13/2020    Authorization - Visit Number 7    Progress Note Due on Visit 10    SLP Start Time 1003    SLP Stop Time  1105    SLP Time Calculation (min) 62 min    Activity Tolerance Patient tolerated treatment well           Past Medical History:  Diagnosis Date   Anxiety and depression    ASCUS with positive high risk HPV 09/2013   Diabetes mellitus without complication ([place])    type 2   Elevated blood sugar    Family history of breast cancer in mother    Fibroid uterus    History of pulmonary embolus (PE)    Hypertension    Menopause    Mild dysplasia of cervix 09/2013   repeat pap done -ecc neg, cerival bx cin 1   Retinal detachment     Past Surgical History:  Procedure Laterality Date   anterior neck fusion     BREAST CYST ASPIRATION Left yrs ago   CHOLECYSTECTOMY  2010   [place]     HYSTEROSCOPY     mole removed  2014    There were no vitals filed for this visit.   Subjective Assessment - 05/23/20 1525    Subjective pt continues to be social and eager to learn information within session    Patient is accompained by: Family member    Currently in Pain? No/denies                 ADULT SLP TREATMENT - 05/23/20 0001      General Information   Behavior/Cognition Alert;Cooperative;Pleasant mood    HPI ANGELI Williams is a 74 y.o. female with medical history  significant of anxiety disorder, diabetes, hypertension, hyperlipidemia, history of pulmonary embolism, with recent infarct in the left anterior basal ganglia involving lenticular nucleus and internal capsule (03/10/2020). Per chart, pt exhibited dysarthria which cleared during acute hospitalization. Of note pt fell striking back of head on 03/06/2020 with diagnosis of concussion and altered mental status.      Treatment Provided   Treatment provided Cognitive-Linquistic      Cognitive-Linquistic Treatment   Treatment focused on Cognition    Skilled Treatment Great improvement over previous session. Pt moderately independent with rehearsal strategy to accurately complete memory activities.        Assessment / Recommendations / Plan   Plan Continue with current plan of care      Progression Toward Goals   Progression toward goals Progressing toward goals            SLP Education - 05/23/20 1525    Education Details memory decreases when distractions increase    Person(s) Educated Patient;Spouse    Methods Explanation;Demonstration;Verbal cues    Comprehension Verbalized understanding            SLP Short Term Goals - 04/24/20  0832      SLP SHORT TERM GOAL #1   Title Given minimal to moderate cues, pt will sequence semi-complex reasoning problem solving tasks with 80% accuracy.    Baseline no ability for semi-complex reasoning tasks    Time 10    Period --   sessions   Status Achieved      SLP SHORT TERM GOAL #2   Title With minimum to moderate cues, pt will pre-plan problem solving tasks with 80% accuracy.    Baseline unable, new concept    Time 10    Period --   sessions   Status Achieved      SLP SHORT TERM GOAL #3   Title With minimum to moderate cues, pt will demonstrate emergent awareness by self-monitoring for errors during semi-complex problem solving tasks.    Baseline pt unable to self-monitor during evaluation    Time 10    Period --   sessions   Status  Achieved      SLP SHORT TERM GOAL #4   Title STGs = LTGs d/t short remaining POC    Baseline progressing    Time 10    Period --   sessions   Status New            SLP Long Term Goals - 04/24/20 4097      SLP LONG TERM GOAL #1   Title Pt will identify cognitive-communication barriers and participate in developing compensatory strategies.    Baseline new goal    Time 12    Period Weeks    Status Achieved      SLP LONG TERM GOAL #2   Title Pt will increase executive cognitive functions to complete complex reasoning tasks.    Baseline moderate deficits in executive functions    Time 10    Period --   sessions   Status Partially Met            Plan - 05/23/20 1526    Clinical Impression Statement Skilled treatment session targeted pts cognitive communication skills. Pt had great insight into difficulty reflected in previous session. Pt stated that she was distracted by a student who was observing this Probation officer. Pt further stated that when she is distracted, her memory is worse. SLP facilitated session by providing same types of memory tasks. Pt able to utilize rehearsal to recall words and to manipulate them. This further confirmed that when she was distracted her memory decreases but her memory increases when there arent any outside distractions. SLP further facilitated the session by introducing novel game of Solitaire to target, recall, sequencing and problem solving. High level of cues required possibly d/t to newest of game for pt.    Speech Therapy Frequency 2x / week    Duration --   12 weeks   Treatment/Interventions Cognitive reorganization;Patient/family education;SLP instruction and feedback    Potential to Achieve Goals Good    SLP Home Exercise Plan given    Consulted and Agree with Plan of Care Patient;Family member/caregiver    Family Member Consulted pt's husband           Patient will benefit from skilled therapeutic intervention in order to improve the  following deficits and impairments:   Cognitive communication deficit    Problem List Patient Active Problem List   Diagnosis Date Noted   CVA (cerebral vascular accident) ([place]) 03/10/2020   Vaginal atrophy 04/29/2016   Fibroid, uterine 04/29/2016   Pelvic pain in female 04/29/2016   Hypertension  10/16/2015   Diabetes mellitus type 2 without retinopathy ([place]) 05/27/2015   Menopause 04/29/2015   Fibroids 04/29/2015   Anxiety 04/29/2015   Cervical dysplasia 04/29/2015   History of pulmonary embolus (PE) 04/29/2015   Family history of breast cancer in mother 04/29/2015   Retinal detachment 04/29/2015   Elica Almas B. Rutherford Nail M.S., CCC-SLP, [place] Pathologist Rehabilitation Services Office [phone]  Stormy Fabian 05/23/2020, 3:27 PM  [place] MAIN [facility] SERVICES [address] [place], [place], [address] Phone: [phone]   Fax:  [phone]   Name: BAYAN HEDSTROM MRN: [id number] Date of Birth: Feb 22, 1946

## 2020-05-28 ENCOUNTER — Ambulatory Visit: Payer: Medicare Other | Admitting: Speech Pathology

## 2020-05-28 ENCOUNTER — Other Ambulatory Visit: Payer: Self-pay

## 2020-05-28 DIAGNOSIS — R41841 Cognitive communication deficit: Secondary | ICD-10-CM

## 2020-05-29 ENCOUNTER — Encounter: Payer: Self-pay | Admitting: Speech Pathology

## 2020-05-29 ENCOUNTER — Other Ambulatory Visit: Payer: Self-pay | Admitting: Family Medicine

## 2020-05-29 DIAGNOSIS — E119 Type 2 diabetes mellitus without complications: Secondary | ICD-10-CM

## 2020-05-29 NOTE — Therapy (Signed)
[place] MAIN [facility] SERVICES [address] [place], [place], [address] Phone: [phone]   Fax:  [phone]  Speech Language Pathology Treatment  Patient Details  Name: Anna Williams MRN: [id number] Date of Birth: 05-Oct-1946 Referring Provider (SLP): Miguel Aschoff   Encounter Date: 05/28/2020   End of Session - 05/29/20 1223    Visit Number 18    Number of Visits 25    Date for SLP Re-Evaluation 06/13/20    Authorization Type [employer] Medicare    Authorization Time Period 03/21/2020 to 06/13/2020    Authorization - Visit Number 8    Progress Note Due on Visit 10    SLP Start Time 1300    SLP Stop Time  1400    SLP Time Calculation (min) 60 min    Activity Tolerance Patient tolerated treatment well           Past Medical History:  Diagnosis Date  . Anxiety and depression   . ASCUS with positive high risk HPV 09/2013  . Diabetes mellitus without complication ([place])    type 2  . Elevated blood sugar   . Family history of breast cancer in mother   . Fibroid uterus   . History of pulmonary embolus (PE)   . Hypertension   . Menopause   . Mild dysplasia of cervix 09/2013   repeat pap done -ecc neg, cerival bx cin 1  . Retinal detachment     Past Surgical History:  Procedure Laterality Date  . anterior neck fusion    . BREAST CYST ASPIRATION Left yrs ago  . CHOLECYSTECTOMY  2010  . DILATION AND CURETTAGE OF UTERUS    . HYSTEROSCOPY    . mole removed  2014    There were no vitals filed for this visit.   Subjective Assessment - 05/29/20 1222    Subjective pt continues to be social and eager to learn information within session    Patient is accompained by: Family member    Currently in Pain? No/denies                 ADULT SLP TREATMENT - 05/29/20 0001      General Information   Behavior/Cognition Alert;Cooperative;Pleasant mood    HPI Anna Williams is a 74 y.o. female with medical history  significant of anxiety disorder, diabetes, hypertension, hyperlipidemia, history of pulmonary embolism, with recent infarct in the left anterior basal ganglia involving lenticular nucleus and internal capsule (03/10/2020). Per chart, pt exhibited dysarthria which cleared during acute hospitalization. Of note pt fell striking back of head on 03/06/2020 with diagnosis of concussion and altered mental status.      Treatment Provided   Treatment provided Cognitive-Linquistic      Cognitive-Linquistic Treatment   Treatment focused on Cognition    Skilled Treatment Maximal multimodal cues for sustained attention to task, sequencing, recall and problem solving during semi-complex problem-solving task       Assessment / Recommendations / [place] with current plan of care      Progression Toward Goals   Progression toward goals Progressing toward goals            SLP Education - 05/29/20 1223    Education Details cognitive ability decreases when distractions are present    Person(s) Educated Patient;Spouse    Methods Explanation;Demonstration;Verbal cues    Comprehension Verbalized understanding            SLP Short Term Goals -  04/24/20 0832      SLP SHORT TERM GOAL #1   Title Given minimal to moderate cues, pt will sequence semi-complex reasoning problem solving tasks with 80% accuracy.    Baseline no ability for semi-complex reasoning tasks    Time 10    Period --   sessions   Status Achieved      SLP SHORT TERM GOAL #2   Title With minimum to moderate cues, pt will pre-plan problem solving tasks with 80% accuracy.    Baseline unable, new concept    Time 10    Period --   sessions   Status Achieved      SLP SHORT TERM GOAL #3   Title With minimum to moderate cues, pt will demonstrate emergent awareness by self-monitoring for errors during semi-complex problem solving tasks.    Baseline pt unable to self-monitor during evaluation    Time 10    Period --   sessions    Status Achieved      SLP SHORT TERM GOAL #4   Title STGs = LTGs d/t short remaining POC    Baseline progressing    Time 10    Period --   sessions   Status New            SLP Long Term Goals - 04/24/20 6010      SLP LONG TERM GOAL #1   Title Pt will identify cognitive-communication barriers and participate in developing compensatory strategies.    Baseline new goal    Time 12    Period Weeks    Status Achieved      SLP LONG TERM GOAL #2   Title Pt will increase executive cognitive functions to complete complex reasoning tasks.    Baseline moderate deficits in executive functions    Time 10    Period --   sessions   Status Partially Met            Plan - 05/29/20 1224    Clinical Impression Statement Skilled treatment session targeted pt's cognitive communication goals. SLP facilitated session by providing maximal multimodal cues for sustained attention to task. Pt was extremely distracted by physical neatness of task, humming to herself, or commenting on task. Pt had great difficulty understanding that these behaviors were distracting and impeded her accuracy with task. SLP had her husband replicate task with SLP providing demonstration of distractions during task. Pt able to "get the point" with this demonstration but maximal cues were required during actual task.    Speech Therapy Frequency 2x / week    Duration --   12 weeks   Treatment/Interventions Cognitive reorganization;Patient/family education;SLP instruction and feedback    Potential to Achieve Goals Good    SLP Home Exercise Plan given    Consulted and Agree with Plan of Care Patient;Family member/caregiver    Family Member Consulted pt's husband           Patient will benefit from skilled therapeutic intervention in order to improve the following deficits and impairments:   Cognitive communication deficit    Problem List Patient Active Problem List   Diagnosis Date Noted  . CVA (cerebral vascular  accident) ([place]) 03/10/2020  . Vaginal atrophy 04/29/2016  . Fibroid, uterine 04/29/2016  . Pelvic pain in female 04/29/2016  . Hypertension 10/16/2015  . Diabetes mellitus type 2 without retinopathy ([place]) 05/27/2015  . Menopause 04/29/2015  . Fibroids 04/29/2015  . Anxiety 04/29/2015  . Cervical dysplasia 04/29/2015  . History of pulmonary  embolus (PE) 04/29/2015  . Family history of breast cancer in mother 04/29/2015  . Retinal detachment 04/29/2015   Amador Braddy B. Rutherford Nail M.S., CCC-SLP, [place] Pathologist Rehabilitation Services Office [phone]  Stormy Fabian 05/29/2020, 12:25 PM  [place] MAIN [facility] SERVICES [address] [place], [place], [address] Phone: [phone]   Fax:  [phone]   Name: Anna Williams MRN: [id number] Date of Birth: 1946-05-14

## 2020-05-29 NOTE — Telephone Encounter (Signed)
Requested Prescriptions  Pending Prescriptions Disp Refills   metFORMIN (GLUCOPHAGE-XR) 500 MG 24 hr tablet [Pharmacy Med Name: METFORMIN HCL ER 500 MG TABLET] 90 tablet 3    Sig: TAKE 1 TABLET BY MOUTH EVERY DAY WITH BREAKFAST     Endocrinology:  Diabetes - [place]uan[place] Passed - 05/29/2020  1:49 AM      Passed - Cr in normal range and within 360 days    Creatinine, Ser  Date Value Ref Range Status  03/11/2020 0.53 0.44 - 1.00 mg/dL Final         Passed - HBA1C is between 0 and 7.9 and within 180 days    Hgb A1c MFr Bld  Date Value Ref Range Status  03/10/2020 7.4 (H) 4.8 - 5.6 % Final    Comment:    (NOTE) Pre diabetes:          5.7%-6.4% Diabetes:              >6.4% Glycemic control for   <7.0% adults with diabetes          Passed - eGFR in normal range and within 360 days    GFR calc Af Amer  Date Value Ref Range Status  03/11/2020 >60 >60 mL/min Final   GFR calc non Af Amer  Date Value Ref Range Status  03/11/2020 >60 >60 mL/min Final         Passed - Valid encounter within last 6 months    Recent Outpatient Visits          3 weeks ago Altered mental status, unspecified altered mental status type   [facility] Jerrol Banana., MD   1 month ago CVA, old, cognitive deficits   [facility] Jerrol Banana., MD   2 months ago Cerebrovascular accident (CVA), unspecified mechanism [facility])   [facility] Jerrol Banana., MD   2 months ago Traumatic injury of head, initial encounter   [facility] Jerrol Banana., MD   10 months ago Diabetes mellitus type 2 without retinopathy [facility])   [facility] Jerrol Banana., MD      Future Appointments            In 1 month Jerrol Banana., MD [facility]

## 2020-05-30 ENCOUNTER — Other Ambulatory Visit: Payer: Self-pay

## 2020-05-30 ENCOUNTER — Ambulatory Visit: Payer: Medicare Other | Admitting: Speech Pathology

## 2020-05-30 DIAGNOSIS — R41841 Cognitive communication deficit: Secondary | ICD-10-CM | POA: Diagnosis not present

## 2020-05-31 ENCOUNTER — Encounter: Payer: Self-pay | Admitting: Speech Pathology

## 2020-05-31 NOTE — Therapy (Addendum)
[place] MAIN [facility] SERVICES [address] [place], [place], [address] Phone: [phone]   Fax:  [phone]  Speech Language Pathology Treatment  Patient Details  Name: Anna Williams MRN: [id number] Date of Birth: 1946/08/06 Referring Provider (SLP): Miguel Aschoff   Encounter Date: 05/30/2020   End of Session - 05/31/20 1356    Visit Number 19    Number of Visits 25    Date for SLP Re-Evaluation 06/13/20    Authorization Type [employer]    Authorization Time Period 03/21/2020 to 06/13/2020    Authorization - Visit Number 9    Progress Note Due on Visit 10    SLP Start Time 1000    SLP Stop Time  1100    SLP Time Calculation (min) 60 min    Activity Tolerance Patient tolerated treatment well           Past Medical History:  Diagnosis Date  . Anxiety and depression   . ASCUS with positive high risk HPV 09/2013  . Diabetes mellitus without complication ([place])    type 2  . Elevated blood sugar   . Family history of breast cancer in mother   . Fibroid uterus   . History of pulmonary embolus (PE)   . Hypertension   . Menopause   . Mild dysplasia of cervix 09/2013   repeat pap done -ecc neg, cerival bx cin 1  . Retinal detachment     Past Surgical History:  Procedure Laterality Date  . anterior neck fusion    . BREAST CYST ASPIRATION Left yrs ago  . CHOLECYSTECTOMY  2010  . DILATION AND CURETTAGE OF UTERUS    . HYSTEROSCOPY    . mole removed  2014    There were no vitals filed for this visit.   Subjective Assessment - 05/31/20 1356    Subjective pt continues to be social and eager to learn information within session    Patient is accompained by: Family member    Currently in Pain? No/denies                 ADULT SLP TREATMENT - 05/31/20 0001      General Information   Behavior/Cognition Alert;Cooperative;Pleasant mood    HPI Anna Williams is a 74 y.o. female with medical history  significant of anxiety disorder, diabetes, hypertension, hyperlipidemia, history of pulmonary embolism, with recent infarct in the left anterior basal ganglia involving lenticular nucleus and internal capsule (03/10/2020). Per chart, pt exhibited dysarthria which cleared during acute hospitalization. Of note pt fell striking back of head on 03/06/2020 with diagnosis of concussion and altered mental status.      Treatment Provided   Treatment provided Cognitive-Linquistic      Cognitive-Linquistic Treatment   Treatment focused on Cognition    Skilled Treatment Minimal cues to recall sequence of semi-complex task      Assessment / Recommendations / [place] with current plan of care      Progression Toward Goals   Progression toward goals Progressing toward goals            SLP Education - 05/31/20 1356    Education Details provided    Person(s) Educated Patient;Spouse    Methods Explanation;Demonstration;Verbal cues    Comprehension Verbalized understanding            SLP Short Term Goals - 04/24/20 0832      SLP SHORT TERM GOAL #1   Title  Given minimal to moderate cues, pt will sequence semi-complex reasoning problem solving tasks with 80% accuracy.    Baseline no ability for semi-complex reasoning tasks    Time 10    Period --   sessions   Status Achieved      SLP SHORT TERM GOAL #2   Title With minimum to moderate cues, pt will pre-plan problem solving tasks with 80% accuracy.    Baseline unable, new concept    Time 10    Period --   sessions   Status Achieved      SLP SHORT TERM GOAL #3   Title With minimum to moderate cues, pt will demonstrate emergent awareness by self-monitoring for errors during semi-complex problem solving tasks.    Baseline pt unable to self-monitor during evaluation    Time 10    Period --   sessions   Status Achieved      SLP SHORT TERM GOAL #4   Title STGs = LTGs d/t short remaining POC    Baseline progressing    Time 10      Period --   sessions   Status New            SLP Long Term Goals - 04/24/20 3354      SLP LONG TERM GOAL #1   Title Pt will identify cognitive-communication barriers and participate in developing compensatory strategies.    Baseline new goal    Time 12    Period Weeks    Status Achieved      SLP LONG TERM GOAL #2   Title Pt will increase executive cognitive functions to complete complex reasoning tasks.    Baseline moderate deficits in executive functions    Time 10    Period --   sessions   Status Partially Met            Plan - 05/31/20 1357    Speech Therapy Frequency 2x / week    Duration 12 weeks    Treatment/Interventions Cognitive reorganization;Patient/family education;SLP instruction and feedback    Potential to Achieve Goals Good    SLP Home Exercise Plan given    Consulted and Agree with Plan of Care Patient;Family member/caregiver          Skilled treatment session focused on pt's cognitive communication goals. SLP facilitated session by providing minimal cues to assist pt with recall and sequencing initial steps for semi-complex reasoning task.    Patient will benefit from skilled therapeutic intervention in order to improve the following deficits and impairments:   Cognitive communication deficit    Problem List Patient Active Problem List   Diagnosis Date Noted  . CVA (cerebral vascular accident) ([place]) 03/10/2020  . Vaginal atrophy 04/29/2016  . Fibroid, uterine 04/29/2016  . Pelvic pain in female 04/29/2016  . Hypertension 10/16/2015  . Diabetes mellitus type 2 without retinopathy ([place]) 05/27/2015  . Menopause 04/29/2015  . Fibroids 04/29/2015  . Anxiety 04/29/2015  . Cervical dysplasia 04/29/2015  . History of pulmonary embolus (PE) 04/29/2015  . Family history of breast cancer in mother 04/29/2015  . Retinal detachment 04/29/2015   Kooper Chriswell B. Rutherford Nail M.S., CCC-SLP, [place] Pathologist Rehabilitation Services Office  [phone]   Stormy Fabian 05/31/2020, 1:58 PM  [place] MAIN [facility] SERVICES [address] [place], [place], [address] Phone: [phone]   Fax:  [phone]   Name: Anna Williams MRN: [id number] Date of Birth: [birth year]

## 2020-06-04 ENCOUNTER — Ambulatory Visit: Payer: Self-pay | Admitting: *Deleted

## 2020-06-04 ENCOUNTER — Encounter: Payer: Medicare Other | Admitting: Speech Pathology

## 2020-06-04 NOTE — Telephone Encounter (Signed)
Patient calls with chronic lower abdominal pain and diarrhea after taking Metformin. This has been occurring almost 2 years. Pain is relieved with the BM. She feels more fatigued since her last OV. Husband stated physician wanted to do blood work with next visit before taking VitB12 for fatigue. Denies fever/CP/SOB/HA/muscle pain/loss of taste/smell/cough/ST. "Diarrhea is same as always been while on Metformin". She would like to discuss alternative to Metformin. Received Covid vaccines. Scheduled via DT because diarrhea is not a new symptom.  Reason for Disposition . Abdominal pain is a chronic symptom (recurrent or ongoing AND present > 4 weeks)  Answer Assessment - Initial Assessment Questions 1. LOCATION: "Where does it hurt?"      lower abdominal pain after taking Metformin.  2. RADIATION: "Does the pain shoot anywhere else?" (e.g., chest, back)     No 3. ONSET: "When did the pain begin?" (e.g., minutes, hours or days ago)      Has been occurring for almost 2 years while on Metformin. 4. SUDDEN: "Gradual or sudden onset?"     Comes on sudden after taking Metformin 5. PATTERN "Does the pain come and go, or is it constant?"    - If constant: "Is it getting better, staying the same, or worsening?"      (Note: Constant means the pain never goes away completely; most serious pain is constant and it progresses)     - If intermittent: "How long does it last?" "Do you have pain now?"     (Note: Intermittent means the pain goes away completely between bouts)   Comes on after taking Metformin and is relieved when the BM occurs 6. SEVERITY: "How bad is the pain?"  (e.g., Scale 1-10; mild, moderate, or severe)   - MILD (1-3): doesn't interfere with normal activities, abdomen soft and not tender to touch    - MODERATE (4-7): interferes with normal activities or awakens from sleep, tender to touch    - SEVERE (8-10): excruciating pain, doubled over, unable to do any normal activities      Able to  continue her daily activities 7. RECURRENT SYMPTOM: "Have you ever had this type of stomach pain before?" If Yes, ask: "When was the last time?" and "What happened that time?"      With this medication8. CAUSE: "What do you think is causing the stomach pain?"     Metformin 9. RELIEVING/AGGRAVATING FACTORS: "What makes it better or worse?" (e.g., movement, antacids, bowel movement)     BM 10. OTHER SYMPTOMS: "Has there been any vomiting, diarrhea, constipation, or urine problems?"       Diarrhea with the abdominal pain after taking Metformin 11. PREGNANCY: "Is there any chance you are pregnant?" "When was your last menstrual period?"       NA  Protocols used: ABDOMINAL PAIN - [facility]

## 2020-06-06 ENCOUNTER — Ambulatory Visit: Payer: Medicare Other | Admitting: Speech Pathology

## 2020-06-06 NOTE — Progress Notes (Signed)
[name],acting as a scribe for Wilhemena Durie, MD.,have documented all relevant documentation on the behalf of Wilhemena Durie, MD,as directed by  Wilhemena Durie, MD while in the presence of Wilhemena Durie, MD.   Established patient visit   Patient: Anna Williams   DOB: July 21, 1946   74 y.o. Female  MRN: [id number] Visit Date: 06/09/2020  Today's healthcare provider: Wilhemena Durie, MD   Chief Complaint  Patient presents with  . Discuss Medication   Subjective    HPI   Patient is here to discuss a different alternative to metformin medication because it causes her to have diarrhea, patient says that she has been on this medication for two years.  She continues to have problem with her memory her husband is with her.  This is happened since her concussion which was followed by her CVA.  Otherwise neurologically she is intact. Patient would also like to discuss b12 shots for fatigue.       Medications: Outpatient Medications Prior to Visit  Medication Sig  . amLODipine (NORVASC) 2.5 MG tablet TAKE 1 TABLET BY MOUTH EVERY DAY  . atorvastatin (LIPITOR) 40 MG tablet TAKE 1 TABLET BY MOUTH EVERY DAY  . Calcium Citrate-Vitamin D (CALCIUM + D PO) Take 1 tablet by mouth daily.  . citalopram (CELEXA) 20 MG tablet TAKE 1/2 TABLET BY MOUTH EVERY DAY  . glucose blood (ONE TOUCH ULTRA TEST) test strip CHECK SUGAR ONCE DAILY  . lidocaine (LIDODERM) 5 % APPLY PATCH TO AFFECTED AREA WHEN AWAKE.  . metFORMIN (GLUCOPHAGE-XR) 500 MG 24 hr tablet TAKE 1 TABLET BY MOUTH EVERY DAY WITH BREAKFAST  . Multiple Vitamins-Minerals (PRESERVISION/LUTEIN PO) Take by mouth daily.   Marland Kitchen omeprazole (PRILOSEC) 20 MG capsule Take 1 capsule (20 mg total) by mouth daily.  Glory Rosebush DELICA LANCETS FINE MISC Check sugar once daily  . pioglitazone (ACTOS) 15 MG tablet TAKE 1 TABLET BY MOUTH EVERY DAY  . Apoaequorin (PREVAGEN) 10 MG CAPS Take 10 mg by mouth daily at 12 noon. (Patient  not taking: Reported on 06/09/2020)  . JARDIANCE 25 MG TABS tablet TAKE 1 TABLET BY MOUTH DAILY (Patient not taking: Reported on 06/09/2020)   No facility-administered medications prior to visit.    Review of Systems  Constitutional: Negative for appetite change, chills, fatigue and fever.  Respiratory: Negative for chest tightness and shortness of breath.   Cardiovascular: Negative for chest pain and palpitations.  Gastrointestinal: Negative for abdominal pain, nausea and vomiting.  Neurological: Negative for dizziness and weakness.    Last hemoglobin A1c Lab Results  Component Value Date   HGBA1C 7.4 (H) 03/10/2020      Objective    There were no vitals taken for this visit. BP Readings from Last 3 Encounters:  06/09/20 132/75  05/08/20 (!) 134/75  03/31/20 130/76   Wt Readings from Last 3 Encounters:  06/09/20 154 lb 6.4 oz (70 kg)  05/08/20 153 lb (69.4 kg)  03/31/20 159 lb (72.1 kg)      Physical Exam Vitals reviewed.  Constitutional:      Appearance: Normal appearance. She is normal weight.  Eyes:     General: No scleral icterus.    Conjunctiva/sclera: Conjunctivae normal.  Cardiovascular:     Rate and Rhythm: Normal rate and regular rhythm.     Pulses: Normal pulses.     Heart sounds: Normal heart sounds.  Pulmonary:     Effort: Pulmonary effort is normal.  Breath sounds: Normal breath sounds.  Abdominal:     General: Bowel sounds are normal.     Palpations: Abdomen is soft.  Musculoskeletal:     Cervical back: Normal range of motion and neck supple.     Right lower leg: No edema.     Left lower leg: No edema.  Skin:    General: Skin is warm and dry.  Neurological:     General: No focal deficit present.     Mental Status: She is alert and oriented to person, place, and time.  Psychiatric:        Mood and Affect: Mood normal.        Behavior: Behavior normal.        Thought Content: Thought content normal.        Judgment: Judgment normal.         No results found for any visits on 06/09/20.  Assessment & Plan     1. Diabetes mellitus type 2 without retinopathy (HCC) Increase pioglitazone to 30 mg daily. - Vitamin B12 - CBC with Differential/Platelet - TSH - pioglitazone (ACTOS) 30 MG tablet; Take 0.5 tablets (15 mg total) by mouth daily.  Dispense: 30 tablet; Refill: 5  2. Other fatigue Patient is not snoring.  No evidence of sleep apnea.  She would like to start B12 and I am fine with this.  Check for B12 deficiency. - Vitamin B12 - CBC with Differential/Platelet - TSH  3. Diarrhea, unspecified type  - Vitamin B12 - CBC with Differential/Platelet - TSH  4. MCI (mild cognitive impairment) This is occurred since her concussion.  May need follow-up with neurology in the future.  Consider [place] neurology  5. Cerebrovascular accident (CVA), unspecified mechanism ([place])   6. Essential hypertension   7. Postconcussive syndrome I have had long discussion with speech therapy.  We will continue same.   No follow-ups on file.         Josi Roediger Cranford Mon, MD  [facility] [phone] (phone) [phone] (fax)  [place]

## 2020-06-08 ENCOUNTER — Other Ambulatory Visit: Payer: Self-pay | Admitting: Family Medicine

## 2020-06-08 DIAGNOSIS — E119 Type 2 diabetes mellitus without complications: Secondary | ICD-10-CM

## 2020-06-09 ENCOUNTER — Other Ambulatory Visit: Payer: Self-pay

## 2020-06-09 ENCOUNTER — Ambulatory Visit (INDEPENDENT_AMBULATORY_CARE_PROVIDER_SITE_OTHER): Payer: Medicare Other | Admitting: Family Medicine

## 2020-06-09 ENCOUNTER — Ambulatory Visit: Payer: Medicare Other | Admitting: Speech Pathology

## 2020-06-09 ENCOUNTER — Encounter: Payer: Self-pay | Admitting: Family Medicine

## 2020-06-09 VITALS — BP 132/75 | HR 76 | Temp 98.5°F | Ht 67.0 in | Wt 154.4 lb

## 2020-06-09 DIAGNOSIS — G3184 Mild cognitive impairment, so stated: Secondary | ICD-10-CM | POA: Diagnosis not present

## 2020-06-09 DIAGNOSIS — E119 Type 2 diabetes mellitus without complications: Secondary | ICD-10-CM

## 2020-06-09 DIAGNOSIS — R5383 Other fatigue: Secondary | ICD-10-CM | POA: Diagnosis not present

## 2020-06-09 DIAGNOSIS — R197 Diarrhea, unspecified: Secondary | ICD-10-CM | POA: Diagnosis not present

## 2020-06-09 DIAGNOSIS — R41841 Cognitive communication deficit: Secondary | ICD-10-CM | POA: Diagnosis not present

## 2020-06-09 DIAGNOSIS — F0781 Postconcussional syndrome: Secondary | ICD-10-CM

## 2020-06-09 DIAGNOSIS — I639 Cerebral infarction, unspecified: Secondary | ICD-10-CM

## 2020-06-09 DIAGNOSIS — I10 Essential (primary) hypertension: Secondary | ICD-10-CM

## 2020-06-09 MED ORDER — PIOGLITAZONE HCL 30 MG PO TABS: 15.0000 mg | ORAL_TABLET | Freq: Every day | ORAL | 5 refills | Status: DC

## 2020-06-09 NOTE — Patient Instructions (Signed)
STOP METFORMIN & OMEPRAZOLE !!!

## 2020-06-10 ENCOUNTER — Encounter: Payer: Self-pay | Admitting: Speech Pathology

## 2020-06-10 LAB — CBC WITH DIFFERENTIAL/PLATELET
Basophils Absolute: 0 10*3/uL (ref 0.0–0.2)
Basos: 0 %
EOS (ABSOLUTE): 0.1 10*3/uL (ref 0.0–0.4)
Eos: 1 %
Hematocrit: 37.5 % (ref 34.0–46.6)
Hemoglobin: 12.7 g/dL (ref 11.1–15.9)
Immature Grans (Abs): 0 10*3/uL (ref 0.0–0.1)
Immature Granulocytes: 0 %
Lymphocytes Absolute: 1.5 10*3/uL (ref 0.7–3.1)
Lymphs: 34 %
MCH: 31.9 pg (ref 26.6–33.0)
MCHC: 33.9 g/dL (ref 31.5–35.7)
MCV: 94 fL (ref 79–97)
Monocytes Absolute: 0.4 10*3/uL (ref 0.1–0.9)
Monocytes: 9 %
Neutrophils Absolute: 2.5 10*3/uL (ref 1.4–7.0)
Neutrophils: 56 %
Platelets: 141 10*3/uL — ABNORMAL LOW (ref 150–450)
RBC: 3.98 x10E6/uL (ref 3.77–5.28)
RDW: 12.2 % (ref 11.7–15.4)
WBC: 4.5 10*3/uL (ref 3.4–10.8)

## 2020-06-10 LAB — VITAMIN B12: Vitamin B-12: 1091 pg/mL (ref 232–1245)

## 2020-06-10 LAB — TSH: TSH: 1.41 u[IU]/mL (ref 0.450–4.500)

## 2020-06-10 NOTE — Therapy (Signed)
[place] MAIN [facility] SERVICES [address] [place], [place], [address] Phone: [phone]   Fax:  [phone]  Speech Language Pathology Treatment Speech Therapy Progress Note   Dates of reporting period  04/23/2020   to   13/05/6577  RECERTIFICATION  Patient Details  Name: Anna Williams MRN: [id number] Date of Birth: 11/01/1945 Referring Provider (SLP): Miguel Aschoff   Encounter Date: 06/09/2020   End of Session - 06/10/20 1241    Visit Number 20    Number of Visits 23    Date for SLP Re-Evaluation 09/02/20    Authorization Type [employer] Medicare    Authorization Time Period 06/09/2020 thru 09/02/2020    Authorization - Visit Number 10    Progress Note Due on Visit 10    SLP Start Time 1000    SLP Stop Time  1100    SLP Time Calculation (min) 60 min    Activity Tolerance Patient tolerated treatment well           Past Medical History:  Diagnosis Date  . Anxiety and depression   . ASCUS with positive high risk HPV 09/2013  . Diabetes mellitus without complication ([place])    type 2  . Elevated blood sugar   . Family history of breast cancer in mother   . Fibroid uterus   . History of pulmonary embolus (PE)   . Hypertension   . Menopause   . Mild dysplasia of cervix 09/2013   repeat pap done -ecc neg, cerival bx cin 1  . Retinal detachment     Past Surgical History:  Procedure Laterality Date  . anterior neck fusion    . BREAST CYST ASPIRATION Left yrs ago  . CHOLECYSTECTOMY  2010  . DILATION AND CURETTAGE OF UTERUS    . HYSTEROSCOPY    . mole removed  2014    There were no vitals filed for this visit.   Subjective Assessment - 06/10/20 1240    Subjective pt continues to be social and eager to learn information within session    Patient is accompained by: Family member    Currently in Pain? No/denies                 ADULT SLP TREATMENT - 06/10/20 0001      General Information    Behavior/Cognition Alert;Cooperative;Pleasant mood    HPI Anna Williams is a 74 y.o. female with medical history significant of anxiety disorder, diabetes, hypertension, hyperlipidemia, history of pulmonary embolism, with recent infarct in the left anterior basal ganglia involving lenticular nucleus and internal capsule (03/10/2020). Per chart, pt exhibited dysarthria which cleared during acute hospitalization. Of note pt fell striking back of head on 03/06/2020 with diagnosis of concussion and altered mental status.      Treatment Provided   Treatment provided Cognitive-Linquistic      Cognitive-Linquistic Treatment   Treatment focused on Cognition    Skilled Treatment Maximal multimodal assistance to utilize memory strategy of rehearsal       Assessment / Recommendations / Plan   Plan Continue with current plan of care      Progression Toward Goals   Progression toward goals Progressing toward goals            SLP Education - 06/10/20 1240    Education Details need to continue organizing verbal information and practice rehearsal of information    Person(s) Educated Patient;Spouse    Methods Explanation;Demonstration;Verbal cues    Comprehension Verbalized  understanding            SLP Short Term Goals - 06/10/20 1244      SLP SHORT TERM GOAL #4   Status Not Met      SLP SHORT TERM GOAL #5   Title Given minimal cues, pt will solve semi-complex reasoning tasks with 90% accuracy.    Baseline max to moderate cues    Time 10    Period --   sessions   Status New      Additional Short Term Goals   Additional Short Term Goals Yes      SLP SHORT TERM GOAL #6   Title With moderate cues, pt will demonstrate selective attention to task for 5 minutes in a mildly distractive environment in 7 out of 10 opportunities.    Baseline maximal cues for selective attnetion for 2 minute intervals    Time 10    Period --   sessions   Status New      SLP SHORT TERM GOAL #7   Title With  moderate cues, pt will utilize memory strategy to recall basic daily information with 75% accuracy.    Baseline maximal assistance    Time 10    Period --   sessions   Status New      SLP SHORT TERM GOAL #8   Title With minimal cues, pt will verbally organize basic information to increase listener understanding in 7 out of 10 opportunities.    Baseline new goal    Time 10    Period --   sessions   Status New            SLP Long Term Goals - 06/10/20 1255      SLP LONG TERM GOAL #1   Title Pt will identify cognitive-communication barriers and participate in developing compensatory strategies.    Baseline re-addressing goal    Time 12    Period Weeks    Status Revised    Target Date 09/02/20      SLP LONG TERM GOAL #2   Title Pt will increase executive cognitive functions to complete complex reasoning tasks.    Baseline moderate deficits in executive functions    Time 12    Period Weeks    Status Partially Met    Target Date 09/02/20      SLP LONG TERM GOAL #3   Title Pt will use memory strategies with minimal assistance to recall basic daily information.    Baseline new goal    Time 12    Period Weeks    Status New    Target Date 09/02/20          10TH WEEK PROGRESS NOTE AND RE-CERTIFICATION Pt continues to eagerly attend skilled ST sessions and as a result she is making progress towards her cognitive communication goals. While pt continues to make progress, her progress has slowed slightly due to increased fatigue and anxiety regarding the recovery process. MD continues to monitor pt's depression and support is provided during ST sessions. Currently pt requires moderate assistance for memory, selective attention and semi-complex problem solving. As a result, skilled sT is required to increase pt's functional independence and reduce caregiver burden.    Plan - 06/10/20 1242    Clinical Impression Statement Skilled treatment session targeted pt's memory deficits. SLP  facilitated session by providing maximal multi-modal assistance to use memory strategy of rehearsal. Specifically, task was broken down into identifying what facts needed to be rehearsed. Teaching  provided on this concept. Pt demonstrated emerging understanding.    Speech Therapy Frequency 2x / week    Duration 12 weeks    Treatment/Interventions Cognitive reorganization;Patient/family education;SLP instruction and feedback    Potential to Achieve Goals Good    SLP Home Exercise Plan given    Consulted and Agree with Plan of Care Patient;Family member/caregiver    Family Member Consulted pt's husband           Patient will benefit from skilled therapeutic intervention in order to improve the following deficits and impairments:   Cognitive communication deficit    Tenecia Ignasiak B. Rutherford Nail M.S., CCC-SLP, [place] Pathologist Rehabilitation Services Office [phone]   Stormy Fabian 06/10/2020, 1:06 PM  [place] MAIN [facility] SERVICES [address] [place], [place], [address] Phone: [phone]   Fax:  [phone]   Name: DORANN DAVIDSON MRN: [id number] Date of Birth: [birth year]

## 2020-06-11 ENCOUNTER — Encounter: Payer: Medicare Other | Admitting: Speech Pathology

## 2020-06-11 ENCOUNTER — Telehealth: Payer: Self-pay

## 2020-06-11 NOTE — Telephone Encounter (Signed)
Copied from [place] [phone]. Topic: General - Inquiry >> Jun 11, 2020  1:39 PM Gillis Ends D wrote: Reason for CRM: Happi Rutherford Nail from [place] called and wants you to call her back at [phone]. In reference to Mrs. Upperman. Please advise

## 2020-06-12 NOTE — Telephone Encounter (Signed)
Returned call to [employer], who stated she has already spoken to Dr. Rosanna Randy.

## 2020-06-13 ENCOUNTER — Encounter: Payer: Self-pay | Admitting: Speech Pathology

## 2020-06-13 ENCOUNTER — Ambulatory Visit: Payer: Medicare Other | Attending: Family Medicine | Admitting: Speech Pathology

## 2020-06-13 ENCOUNTER — Other Ambulatory Visit: Payer: Self-pay

## 2020-06-13 DIAGNOSIS — R41841 Cognitive communication deficit: Secondary | ICD-10-CM | POA: Diagnosis not present

## 2020-06-13 NOTE — Therapy (Signed)
[place] MAIN [facility] SERVICES [address] [place], [place], [address] Phone: [phone]   Fax:  [phone]  Speech Language Pathology Treatment  Patient Details  Name: Anna Williams MRN: [id number] Date of Birth: Dec 03, 1945 Referring Provider (SLP): Miguel Aschoff   Encounter Date: 06/13/2020   End of Session - 06/13/20 1407    Visit Number 21    Number of Visits 42    Date for SLP Re-Evaluation 09/02/20    Authorization Type [employer]    Authorization Time Period 06/09/2020 thru 09/02/2020    Authorization - Visit Number 1    Progress Note Due on Visit 10    SLP Start Time 1015    SLP Stop Time  1145    SLP Time Calculation (min) 90 min    Activity Tolerance Patient tolerated treatment well           Past Medical History:  Diagnosis Date   Anxiety and depression    ASCUS with positive high risk HPV 09/2013   Diabetes mellitus without complication ([place])    type 2   Elevated blood sugar    Family history of breast cancer in mother    Fibroid uterus    History of pulmonary embolus (PE)    Hypertension    Menopause    Mild dysplasia of cervix 09/2013   repeat pap done -ecc neg, cerival bx cin 1   Retinal detachment     Past Surgical History:  Procedure Laterality Date   anterior neck fusion     BREAST CYST ASPIRATION Left yrs ago   CHOLECYSTECTOMY  2010   [place]     HYSTEROSCOPY     mole removed  2014    There were no vitals filed for this visit.   Subjective Assessment - 06/13/20 1406    Subjective pt continues to be social and eager to learn information within session    Patient is accompained by: Family member    Currently in Pain? No/denies                 ADULT SLP TREATMENT - 06/13/20 0001      General Information   Behavior/Cognition Alert;Cooperative;Pleasant mood    HPI Anna Williams is a 74 y.o. female with medical history  significant of anxiety disorder, diabetes, hypertension, hyperlipidemia, history of pulmonary embolism, with recent infarct in the left anterior basal ganglia involving lenticular nucleus and internal capsule (03/10/2020). Per chart, pt exhibited dysarthria which cleared during acute hospitalization. Of note pt fell striking back of head on 03/06/2020 with diagnosis of concussion and altered mental status.      Treatment Provided   Treatment provided Cognitive-Linquistic      Cognitive-Linquistic Treatment   Treatment focused on Cognition    Skilled Treatment Extensive education provided to pt and her husband on s/s of concussion, post-concussion syndrome, therapeutic cognitive tasks       Assessment / Recommendations / Plan   Plan Continue with current plan of care      Progression Toward Goals   Progression toward goals Progressing toward goals            SLP Education - 06/13/20 1407    Education Details neurological events, CVa and concussion    Person(s) Educated Patient;Spouse    Methods Explanation;Demonstration;Verbal cues    Comprehension Verbalized understanding            SLP Short Term Goals - 06/10/20  [place] GOAL #4   Status Not Met      SLP SHORT TERM GOAL #5   Title Given minimal cues, pt will solve semi-complex reasoning tasks with 90% accuracy.    Baseline max to moderate cues    Time 10    Period --   sessions   Status New      Additional Short Term Goals   Additional Short Term Goals Yes      SLP SHORT TERM GOAL #6   Title With moderate cues, pt will demonstrate selective attention to task for 5 minutes in a mildly distractive environment in 7 out of 10 opportunities.    Baseline maximal cues for selective attnetion for 2 minute intervals    Time 10    Period --   sessions   Status New      SLP SHORT TERM GOAL #7   Title With moderate cues, pt will utilize memory strategy to recall basic daily information with 75% accuracy.     Baseline maximal assistance    Time 10    Period --   sessions   Status New      SLP SHORT TERM GOAL #8   Title With minimal cues, pt will verbally organize basic information to increase listener understanding in 7 out of 10 opportunities.    Baseline new goal    Time 10    Period --   sessions   Status New            SLP Long Term Goals - 06/10/20 1255      SLP LONG TERM GOAL #1   Title Pt will identify cognitive-communication barriers and participate in developing compensatory strategies.    Baseline re-addressing goal    Time 12    Period Weeks    Status Revised    Target Date 09/02/20      SLP LONG TERM GOAL #2   Title Pt will increase executive cognitive functions to complete complex reasoning tasks.    Baseline moderate deficits in executive functions    Time 12    Period Weeks    Status Partially Met    Target Date 09/02/20      SLP LONG TERM GOAL #3   Title Pt will use memory strategies with minimal assistance to recall basic daily information.    Baseline new goal    Time 12    Period Weeks    Status New    Target Date 09/02/20            Plan - 06/13/20 1408    Clinical Impression Statement Skilled ST session focused on pt and caregiver education. Pt frequently asks, is this normal? referring to deficits and slowness of recovery process. To help pt and her husband understand recovery, extensive time was spent educating them on what is a concussion, reviewing her history of neurological events, describing post-concussive syndrome, different facets of cognitive rehabilitation, cognitive hierarchy. Pt and her husband had questions that were answered to their satisfaction.    Speech Therapy Frequency 2x / week    Duration 12 weeks    Treatment/Interventions Cognitive reorganization;Patient/family education;SLP instruction and feedback    Potential to Achieve Goals Good    SLP Home Exercise Plan given    Consulted and Agree with Plan of Care Patient;Family  member/caregiver    Family Member Consulted pt's husband           Patient will benefit  from skilled therapeutic intervention in order to improve the following deficits and impairments:   Cognitive communication deficit    Problem List Patient Active Problem List   Diagnosis Date Noted   CVA (cerebral vascular accident) ([place]) 03/10/2020   Vaginal atrophy 04/29/2016   Fibroid, uterine 04/29/2016   Pelvic pain in female 04/29/2016   Hypertension 10/16/2015   Diabetes mellitus type 2 without retinopathy ([place]) 05/27/2015   Menopause 04/29/2015   Fibroids 04/29/2015   Anxiety 04/29/2015   Cervical dysplasia 04/29/2015   History of pulmonary embolus (PE) 04/29/2015   Family history of breast cancer in mother 04/29/2015   Retinal detachment 04/29/2015   Fay Swider B. Rutherford Nail M.S., CCC-SLP, [place] Pathologist Rehabilitation Services Office [phone]  Stormy Fabian 06/13/2020, 2:12 PM  [place] MAIN [facility] SERVICES [address] [place], [place], [address] Phone: [phone]   Fax:  [phone]   Name: AURIELLA WIEAND MRN: [id number] Date of Birth: [birth year]

## 2020-06-17 ENCOUNTER — Other Ambulatory Visit: Payer: Self-pay | Admitting: Family Medicine

## 2020-06-17 DIAGNOSIS — F329 Major depressive disorder, single episode, unspecified: Secondary | ICD-10-CM

## 2020-06-17 MED ORDER — BUPROPION HCL ER (SR) 100 MG PO TB12: 100.0000 mg | ORAL_TABLET | Freq: Two times a day (BID) | ORAL | 5 refills | Status: DC

## 2020-06-18 ENCOUNTER — Other Ambulatory Visit: Payer: Self-pay

## 2020-06-18 ENCOUNTER — Ambulatory Visit: Payer: Medicare Other | Admitting: Speech Pathology

## 2020-06-18 DIAGNOSIS — R41841 Cognitive communication deficit: Secondary | ICD-10-CM | POA: Diagnosis not present

## 2020-06-19 ENCOUNTER — Encounter: Payer: Self-pay | Admitting: Speech Pathology

## 2020-06-19 NOTE — Therapy (Signed)
[place] MAIN [facility] SERVICES [address] [place], [place], [address] Phone: [phone]   Fax:  [phone]  Speech Language Pathology Treatment  Patient Details  Name: Anna Williams MRN: [id number] Date of Birth: January 13, 1946 Referring Provider (SLP): Miguel Aschoff   Encounter Date: 06/18/2020   End of Session - 06/19/20 1317    Visit Number 22    Number of Visits 69    Date for SLP Re-Evaluation 09/02/20    Authorization Type [employer]    Authorization Time Period 06/09/2020 thru 09/02/2020    Authorization - Visit Number 2    Progress Note Due on Visit 10    SLP Start Time 1010    SLP Stop Time  1105    SLP Time Calculation (min) 55 min    Activity Tolerance Patient tolerated treatment well           Past Medical History:  Diagnosis Date  . Anxiety and depression   . ASCUS with positive high risk HPV 09/2013  . Diabetes mellitus without complication ([place])    type 2  . Elevated blood sugar   . Family history of breast cancer in mother   . Fibroid uterus   . History of pulmonary embolus (PE)   . Hypertension   . Menopause   . Mild dysplasia of cervix 09/2013   repeat pap done -ecc neg, cerival bx cin 1  . Retinal detachment     Past Surgical History:  Procedure Laterality Date  . anterior neck fusion    . BREAST CYST ASPIRATION Left yrs ago  . CHOLECYSTECTOMY  2010  . DILATION AND CURETTAGE OF UTERUS    . HYSTEROSCOPY    . mole removed  2014    There were no vitals filed for this visit.   Subjective Assessment - 06/19/20 1316    Subjective pt continues to be social and eager to learn information within session    Patient is accompained by: Family member    Currently in Pain? No/denies                 ADULT SLP TREATMENT - 06/19/20 0001      General Information   Behavior/Cognition Alert;Cooperative;Pleasant mood    HPI Anna Williams is a 74 y.o. female with medical history  significant of anxiety disorder, diabetes, hypertension, hyperlipidemia, history of pulmonary embolism, with recent infarct in the left anterior basal ganglia involving lenticular nucleus and internal capsule (03/10/2020). Per chart, pt exhibited dysarthria which cleared during acute hospitalization. Of note pt fell striking back of head on 03/06/2020 with diagnosis of concussion and altered mental status.      Treatment Provided   Treatment provided Cognitive-Linquistic      Cognitive-Linquistic Treatment   Treatment focused on Cognition    Skilled Treatment Maximal assistance to recall current list of medicines, pt unsure that she is taking them as prescribed, used app on pt's cellphone to set alarm reminders to take newly prescribed Wellbutrin, established food journal as memory aid as pt desires to add more fiber to her diet       Assessment / Recommendations / [place] with current plan of care      Progression Toward Goals   Progression toward goals Progressing toward goals            SLP Education - 06/19/20 1317    Education Details medication managment    Person(s) Educated Patient;Spouse  Methods Explanation;Demonstration;Verbal cues    Comprehension Verbalized understanding            SLP Short Term Goals - 06/10/20 1244      SLP SHORT TERM GOAL #4   Status Not Met      SLP SHORT TERM GOAL #5   Title Given minimal cues, pt will solve semi-complex reasoning tasks with 90% accuracy.    Baseline max to moderate cues    Time 10    Period --   sessions   Status New      Additional Short Term Goals   Additional Short Term Goals Yes      SLP SHORT TERM GOAL #6   Title With moderate cues, pt will demonstrate selective attention to task for 5 minutes in a mildly distractive environment in 7 out of 10 opportunities.    Baseline maximal cues for selective attnetion for 2 minute intervals    Time 10    Period --   sessions   Status New      SLP SHORT TERM  GOAL #7   Title With moderate cues, pt will utilize memory strategy to recall basic daily information with 75% accuracy.    Baseline maximal assistance    Time 10    Period --   sessions   Status New      SLP SHORT TERM GOAL #8   Title With minimal cues, pt will verbally organize basic information to increase listener understanding in 7 out of 10 opportunities.    Baseline new goal    Time 10    Period --   sessions   Status New            SLP Long Term Goals - 06/10/20 1255      SLP LONG TERM GOAL #1   Title Pt will identify cognitive-communication barriers and participate in developing compensatory strategies.    Baseline re-addressing goal    Time 12    Period Weeks    Status Revised    Target Date 09/02/20      SLP LONG TERM GOAL #2   Title Pt will increase executive cognitive functions to complete complex reasoning tasks.    Baseline moderate deficits in executive functions    Time 12    Period Weeks    Status Partially Met    Target Date 09/02/20      SLP LONG TERM GOAL #3   Title Pt will use memory strategies with minimal assistance to recall basic daily information.    Baseline new goal    Time 12    Period Weeks    Status New    Target Date 09/02/20            Plan - 06/19/20 1318    Clinical Impression Statement Skilled treatment session targeted pt's cognitive communication skills. SLP facilitated session by helping pt set alarm on her cellphone as a memory strategy to help with taking newly prescribed afternoon dose. Pt was not able to recall the medicines she is currently taking and as she spoke, she became uncertain if she is taking them correctly. Pt to bring in her medicines for the next session and practice medication organization with pill box. Secure chat sent to Dr Rosanna Randy requesting a copy of current medications. Pt desire to increase fiber intake in hopes that it will help with her continuous loose stools. Food journal created to help pt's  remember and reflect on how much fiber she is eating  per day.    Speech Therapy Frequency 2x / week    Duration 12 weeks    Treatment/Interventions Cognitive reorganization;Patient/family education;SLP instruction and feedback    Potential to Achieve Goals Good    Potential Considerations Ability to learn/carryover information    SLP Home Exercise Plan given    Consulted and Agree with Plan of Care Patient;Family member/caregiver    Family Member Consulted pt's husband           Patient will benefit from skilled therapeutic intervention in order to improve the following deficits and impairments:   Cognitive communication deficit    Problem List Patient Active Problem List   Diagnosis Date Noted  . CVA (cerebral vascular accident) ([place]) 03/10/2020  . Vaginal atrophy 04/29/2016  . Fibroid, uterine 04/29/2016  . Pelvic pain in female 04/29/2016  . Hypertension 10/16/2015  . Diabetes mellitus type 2 without retinopathy ([place]) 05/27/2015  . Menopause 04/29/2015  . Fibroids 04/29/2015  . Anxiety 04/29/2015  . Cervical dysplasia 04/29/2015  . History of pulmonary embolus (PE) 04/29/2015  . Family history of breast cancer in mother 04/29/2015  . Retinal detachment 04/29/2015   Nikka Hakimian B. Rutherford Nail M.S., CCC-SLP, [place] Pathologist Rehabilitation Services Office [phone]  Stormy Fabian 06/19/2020, 1:19 PM  [place] MAIN [facility] SERVICES [address] [place], [place], [address] Phone: [phone]   Fax:  [phone]   Name: MARSHAL SCHRECENGOST MRN: [id number] Date of Birth: [birth year]

## 2020-06-20 ENCOUNTER — Encounter: Payer: Self-pay | Admitting: Speech Pathology

## 2020-06-20 ENCOUNTER — Ambulatory Visit: Payer: Medicare Other | Admitting: Speech Pathology

## 2020-06-20 ENCOUNTER — Encounter: Payer: Self-pay | Admitting: Family Medicine

## 2020-06-20 ENCOUNTER — Other Ambulatory Visit: Payer: Self-pay

## 2020-06-20 DIAGNOSIS — R41841 Cognitive communication deficit: Secondary | ICD-10-CM | POA: Diagnosis not present

## 2020-06-20 NOTE — Therapy (Signed)
[place] MAIN [facility] SERVICES [address] [place], [place], [address] Phone: [phone]   Fax:  [phone]  Speech Language Pathology Treatment  Patient Details  Name: Anna Williams MRN: [id number] Date of Birth: 1945/10/28 Referring Provider (SLP): Miguel Aschoff   Encounter Date: 06/20/2020   End of Session - 06/20/20 1504    Visit Number 23    Number of Visits 76    Date for SLP Re-Evaluation 09/02/20    Authorization Type [employer] Medicare    Authorization Time Period 06/09/2020 thru 09/02/2020    Authorization - Visit Number 3    Progress Note Due on Visit 10    SLP Start Time 1000    SLP Stop Time  1100    SLP Time Calculation (min) 60 min    Activity Tolerance Patient tolerated treatment well           Past Medical History:  Diagnosis Date  . Anxiety and depression   . ASCUS with positive high risk HPV 09/2013  . Diabetes mellitus without complication ([place])    type 2  . Elevated blood sugar   . Family history of breast cancer in mother   . Fibroid uterus   . History of pulmonary embolus (PE)   . Hypertension   . Menopause   . Mild dysplasia of cervix 09/2013   repeat pap done -ecc neg, cerival bx cin 1  . Retinal detachment     Past Surgical History:  Procedure Laterality Date  . anterior neck fusion    . BREAST CYST ASPIRATION Left yrs ago  . CHOLECYSTECTOMY  2010  . DILATION AND CURETTAGE OF UTERUS    . HYSTEROSCOPY    . mole removed  2014    There were no vitals filed for this visit.   Subjective Assessment - 06/20/20 1502    Subjective pt continues to be social but has questions regarding continued need for cognitive therapy    Patient is accompained by: Family member    Currently in Pain? No/denies                 ADULT SLP TREATMENT - 06/20/20 0001      General Information   Behavior/Cognition Alert;Cooperative;Pleasant mood    HPI Anna Williams is a 74 y.o. female with  medical history significant of anxiety disorder, diabetes, hypertension, hyperlipidemia, history of pulmonary embolism, with recent infarct in the left anterior basal ganglia involving lenticular nucleus and internal capsule (03/10/2020). Per chart, pt exhibited dysarthria which cleared during acute hospitalization. Of note pt fell striking back of head on 03/06/2020 with diagnosis of concussion and altered mental status.      Treatment Provided   Treatment provided Cognitive-Linquistic      Cognitive-Linquistic Treatment   Treatment focused on Cognition    Skilled Treatment Education provided on [facility] for continued cognitive communication therapy, pt unaware of examples provided from therapy sessions and her husband's examples       Assessment / Recommendations / [place] with current plan of care      Progression Toward Goals   Progression toward goals Progressing toward goals            SLP Education - 06/20/20 1503    Education Details continued need for cognitive therapy to target short term memory and attention    Person(s) Educated Patient;Spouse    Methods Explanation;Demonstration;Verbal cues    Comprehension Verbalized understanding  SLP Short Term Goals - 06/10/20 1244      SLP SHORT TERM GOAL #4   Status Not Met      SLP SHORT TERM GOAL #5   Title Given minimal cues, pt will solve semi-complex reasoning tasks with 90% accuracy.    Baseline max to moderate cues    Time 10    Period --   sessions   Status New      Additional Short Term Goals   Additional Short Term Goals Yes      SLP SHORT TERM GOAL #6   Title With moderate cues, pt will demonstrate selective attention to task for 5 minutes in a mildly distractive environment in 7 out of 10 opportunities.    Baseline maximal cues for selective attnetion for 2 minute intervals    Time 10    Period --   sessions   Status New      SLP SHORT TERM GOAL #7   Title With moderate cues, pt will  utilize memory strategy to recall basic daily information with 75% accuracy.    Baseline maximal assistance    Time 10    Period --   sessions   Status New      SLP SHORT TERM GOAL #8   Title With minimal cues, pt will verbally organize basic information to increase listener understanding in 7 out of 10 opportunities.    Baseline new goal    Time 10    Period --   sessions   Status New            SLP Long Term Goals - 06/10/20 1255      SLP LONG TERM GOAL #1   Title Pt will identify cognitive-communication barriers and participate in developing compensatory strategies.    Baseline re-addressing goal    Time 12    Period Weeks    Status Revised    Target Date 09/02/20      SLP LONG TERM GOAL #2   Title Pt will increase executive cognitive functions to complete complex reasoning tasks.    Baseline moderate deficits in executive functions    Time 12    Period Weeks    Status Partially Met    Target Date 09/02/20      SLP LONG TERM GOAL #3   Title Pt will use memory strategies with minimal assistance to recall basic daily information.    Baseline new goal    Time 12    Period Weeks    Status New    Target Date 09/02/20            Plan - 06/20/20 1504    Clinical Impression Statement Skilled treatment session targeted pt's cognitive communication goals. PT and her husband recieved calendar for the 2 next 2 months of ST session. pt had questions wether all of the sessions were needed. Pt unable to identify cognitive deficits within her everyday activities. SLP provided several examples that occur within therapy sessions and husband provided several examples. Despite taking great care when providing descriptions of observed deficits, pt appears slightly hurt and is somewhat defensive. Plan made to continue targeting short-term memory, self-awareness, semantic paraphasias, attention and complex problem solving.    Speech Therapy Frequency 2x / week    Duration 12 weeks     Treatment/Interventions Cognitive reorganization;Patient/family education;SLP instruction and feedback    Potential to Achieve Goals Good    Potential Considerations Ability to learn/carryover information    SLP Home Exercise  Plan given    Consulted and Agree with Plan of Care Patient;Family member/caregiver    Family Member Consulted pt's husband           Patient will benefit from skilled therapeutic intervention in order to improve the following deficits and impairments:   Cognitive communication deficit    Problem List Patient Active Problem List   Diagnosis Date Noted  . CVA (cerebral vascular accident) ([place]) 03/10/2020  . Vaginal atrophy 04/29/2016  . Fibroid, uterine 04/29/2016  . Pelvic pain in female 04/29/2016  . Hypertension 10/16/2015  . Diabetes mellitus type 2 without retinopathy ([place]) 05/27/2015  . Menopause 04/29/2015  . Fibroids 04/29/2015  . Anxiety 04/29/2015  . Cervical dysplasia 04/29/2015  . History of pulmonary embolus (PE) 04/29/2015  . Family history of breast cancer in mother 04/29/2015  . Retinal detachment 04/29/2015   Anna Williams B. Rutherford Nail M.S., CCC-SLP, [place] Pathologist Rehabilitation Services Office [phone]   Stormy Fabian 06/20/2020, 3:10 PM  [place] MAIN [facility] SERVICES [address] [place], [place], [address] Phone: [phone]   Fax:  [phone]   Name: Anna Williams MRN: [id number] Date of Birth: [birth year]

## 2020-06-23 ENCOUNTER — Other Ambulatory Visit: Payer: Self-pay | Admitting: Obstetrics and Gynecology

## 2020-06-23 DIAGNOSIS — Z1231 Encounter for screening mammogram for malignant neoplasm of breast: Secondary | ICD-10-CM

## 2020-06-24 ENCOUNTER — Ambulatory Visit: Payer: Medicare Other | Admitting: Speech Pathology

## 2020-06-24 ENCOUNTER — Other Ambulatory Visit: Payer: Self-pay

## 2020-06-24 DIAGNOSIS — R41841 Cognitive communication deficit: Secondary | ICD-10-CM | POA: Diagnosis not present

## 2020-06-25 ENCOUNTER — Encounter: Payer: Self-pay | Admitting: Speech Pathology

## 2020-06-26 ENCOUNTER — Other Ambulatory Visit: Payer: Self-pay

## 2020-06-26 ENCOUNTER — Ambulatory Visit: Payer: Medicare Other | Admitting: Speech Pathology

## 2020-06-26 DIAGNOSIS — R41841 Cognitive communication deficit: Secondary | ICD-10-CM

## 2020-06-26 NOTE — Therapy (Signed)
[place] MAIN [facility] SERVICES [address] [place], [place], [address] Phone: [phone]   Fax:  [phone]  Speech Language Pathology Treatment  Patient Details  Name: Anna Williams MRN: [id number] Date of Birth: [birth year] Referring Provider (SLP): Miguel Aschoff   Encounter Date: 06/24/2020   End of Session - 06/26/20 0732    Visit Number 24    Number of Visits 44    Date for SLP Re-Evaluation 09/02/20    Authorization Type [employer]    Authorization Time Period 06/09/2020 thru 09/02/2020    Authorization - Visit Number 4    Progress Note Due on Visit 10    SLP Start Time 1000    SLP Stop Time  1115    SLP Time Calculation (min) 75 min    Activity Tolerance Patient tolerated treatment well           Past Medical History:  Diagnosis Date  . Anxiety and depression   . ASCUS with positive high risk HPV 09/2013  . Diabetes mellitus without complication ([place])    type 2  . Elevated blood sugar   . Family history of breast cancer in mother   . Fibroid uterus   . History of pulmonary embolus (PE)   . Hypertension   . Menopause   . Mild dysplasia of cervix 09/2013   repeat pap done -ecc neg, cerival bx cin 1  . Retinal detachment     Past Surgical History:  Procedure Laterality Date  . anterior neck fusion    . BREAST CYST ASPIRATION Left yrs ago  . CHOLECYSTECTOMY  2010  . DILATION AND CURETTAGE OF UTERUS    . HYSTEROSCOPY    . mole removed  2014    There were no vitals filed for this visit.   Subjective Assessment - 06/25/20 1558    Subjective pt shaking uncontrollably    Patient is accompained by: Family member    Currently in Pain? No/denies                 ADULT SLP TREATMENT - 06/26/20 0001      General Information   Behavior/Cognition Alert;Cooperative;Pleasant mood    HPI Anna Williams is a 74 y.o. female with medical history significant of anxiety disorder, diabetes,  hypertension, hyperlipidemia, history of pulmonary embolism, with recent infarct in the left anterior basal ganglia involving lenticular nucleus and internal capsule (03/10/2020). Per chart, pt exhibited dysarthria which cleared during acute hospitalization. Of note pt fell striking back of head on 03/06/2020 with diagnosis of concussion and altered mental status.      Treatment Provided   Treatment provided Cognitive-Linquistic      Cognitive-Linquistic Treatment   Treatment focused on Cognition    Skilled Treatment Assistance provided in creating journal to document medical stats and any episodes of uncontrolled shaking, education also provided on decreasing pt's perseverative attention to ideas other than cognitive recovery from concussion and CVA      Assessment / Recommendations / Plan   Plan Continue with current plan of care      Progression Toward Goals   Progression toward goals Progressing toward goals            SLP Education - 06/26/20 0730    Education Details provided on guarded rognosis for cogniitve rehab if pt continues to perseverate and have increased anxiety regarding topics not related to recovery    Person(s) Educated Patient;Spouse    Methods Explanation;Demonstration;Verbal cues  Comprehension Need further instruction            SLP Short Term Goals - 06/10/20 1244      SLP SHORT TERM GOAL #4   Status Not Met      SLP SHORT TERM GOAL #5   Title Given minimal cues, pt will solve semi-complex reasoning tasks with 90% accuracy.    Baseline max to moderate cues    Time 10    Period --   sessions   Status New      Additional Short Term Goals   Additional Short Term Goals Yes      SLP SHORT TERM GOAL #6   Title With moderate cues, pt will demonstrate selective attention to task for 5 minutes in a mildly distractive environment in 7 out of 10 opportunities.    Baseline maximal cues for selective attnetion for 2 minute intervals    Time 10    Period --    sessions   Status New      SLP SHORT TERM GOAL #7   Title With moderate cues, pt will utilize memory strategy to recall basic daily information with 75% accuracy.    Baseline maximal assistance    Time 10    Period --   sessions   Status New      SLP SHORT TERM GOAL #8   Title With minimal cues, pt will verbally organize basic information to increase listener understanding in 7 out of 10 opportunities.    Baseline new goal    Time 10    Period --   sessions   Status New            SLP Long Term Goals - 06/10/20 1255      SLP LONG TERM GOAL #1   Title Pt will identify cognitive-communication barriers and participate in developing compensatory strategies.    Baseline re-addressing goal    Time 12    Period Weeks    Status Revised    Target Date 09/02/20      SLP LONG TERM GOAL #2   Title Pt will increase executive cognitive functions to complete complex reasoning tasks.    Baseline moderate deficits in executive functions    Time 12    Period Weeks    Status Partially Met    Target Date 09/02/20      SLP LONG TERM GOAL #3   Title Pt will use memory strategies with minimal assistance to recall basic daily information.    Baseline new goal    Time 12    Period Weeks    Status New    Target Date 09/02/20            Plan - 06/26/20 0732    Clinical Impression Statement Skilled treatment session targeted pt's cognition goals. Pt arrived at [facility] appointment with uncontrollable shaking. No explanation for shaking was provided by pt or her husband. Pt did have recent medication changes per her request. One such medication change was d/c metformin d/t possible contribution to her chronic loose stools. This Probation officer provided pt with graham crackers, peanut butter as well as several pieces of chocolate. Vitals were BP 172/83; manually BP was 160/70; Pulse 89. Assistance provided to create a medical journal to chart these 'episodes" and to have information for upcoming appt with  MD. Honest education was provided to pt and her husband that pt's prognosis for cognitive rehab is very guarded d/t pt's prominent level of anxiety and perseveration on "  fixing everything at once" (example changing medicines post concussion/CVA for chronic conditions), which in turn creates anxiety with the results. Strong education provided that pt and her husband must prioritize and focus on one 1 goal during this process. This writer recommended the goal be cognitive recovery vs dental work vs chronic medical condition such as loose stools.    Speech Therapy Frequency 2x / week    Duration 12 weeks    Treatment/Interventions Cognitive reorganization;Patient/family education;SLP instruction and feedback    Potential to Achieve Goals Good    Potential Considerations Ability to learn/carryover information    SLP Home Exercise Plan chart vitals    Consulted and Agree with Plan of Care Patient;Family member/caregiver    Family Member Consulted pt's husband           Patient will benefit from skilled therapeutic intervention in order to improve the following deficits and impairments:   Cognitive communication deficit    Problem List Patient Active Problem List   Diagnosis Date Noted  . CVA (cerebral vascular accident) ([place]) 03/10/2020  . Vaginal atrophy 04/29/2016  . Fibroid, uterine 04/29/2016  . Pelvic pain in female 04/29/2016  . Hypertension 10/16/2015  . Diabetes mellitus type 2 without retinopathy ([place]) 05/27/2015  . Menopause 04/29/2015  . Fibroids 04/29/2015  . Anxiety 04/29/2015  . Cervical dysplasia 04/29/2015  . History of pulmonary embolus (PE) 04/29/2015  . Family history of breast cancer in mother 04/29/2015  . Retinal detachment 04/29/2015   Anna Williams B. Rutherford Nail M.S., CCC-SLP, [place] Pathologist Rehabilitation Services Office [phone]  Stormy Fabian 06/26/2020, 7:43 AM  [place] MAIN [facility] SERVICES [address] [place], [place], [address] Phone: [phone]   Fax:  [phone]   Name: TIANNA BAUS MRN: [id number] Date of Birth: [birth year]

## 2020-06-27 ENCOUNTER — Encounter: Payer: Self-pay | Admitting: Speech Pathology

## 2020-06-27 NOTE — Progress Notes (Signed)
I,Tallis Soledad,acting as a scribe for Wilhemena Durie, MD.,have documented all relevant documentation on the behalf of Wilhemena Durie, MD,as directed by  Wilhemena Durie, MD while in the presence of Wilhemena Durie, MD.  Established patient visit   Patient: Anna Williams   DOB: [birth year]   74 y.o. Female  MRN: [id number] Visit Date: 06/30/2020  Today's healthcare provider: Wilhemena Durie, MD   Chief Complaint  Patient presents with  . Diabetes  . Follow-up   Subjective    HPI  Patient has had COVID vaccine. She is having more more problems with anxiety recently.  She is also lost weight and has no appetite.  Her husband is with her again today.  Her speech therapist has noted the cognitive issues. Her only other complaint today is a metallic taste in her mouth for the past 2 weeks since being on Wellbutrin.  She states that however she feels like she has more energy since starting the Wellbutrin. Her diarrhea is resolved off of Metformin.  She is worried about her blood sugars but Jardiance caused her yeast infection.  She is on Actos 30 mg daily now. Diabetes Mellitus Type II, follow-up  Lab Results  Component Value Date   HGBA1C 8.2 (A) 06/30/2020   HGBA1C 7.4 (H) 03/10/2020   HGBA1C 8.4 (A) 07/19/2019   Last seen for diabetes 3 weeks ago.  Management since then includes; Increased pioglitazone to 30 mg daily. She reports good compliance with treatment. She is not having side effects. none  Home blood sugar records: fasting range: 232  Episodes of hypoglycemia? No none   Current insulin regiment: n/a Most Recent Eye Exam: 03/02/2018  --------------------------------------------------------------------  Other fatigue From 06/09/2020-Patient is not snoring.  No evidence of sleep apnea.  She would like to start B12 and I am fine with this.  Check for B12 deficiency.  MCI (mild cognitive impairment) frm 06/09/2020-This is occurred since her  concussion.  May need follow-up with neurology in the future.  Consider [facility].  Postconcussive syndrome From 06/09/2020-I have had long discussion with speech therapy.  We will continue same.     Office Visit from 06/30/2020 in [facility] [place]  PHQ-9 Total Score 17     GAD 7 : Generalized Anxiety Score 06/30/2020  Nervous, Anxious, on Edge 3  Control/stop worrying 2  Worry too much - different things 2  Trouble relaxing 2  Restless 2  Easily annoyed or irritable 3  Afraid - awful might happen 3  Total GAD 7 Score 17  Anxiety Difficulty Not difficult at all   MMSE - Mini Mental State Exam 06/30/2020  Orientation to time 4  Orientation to Place 5  Registration 3  Attention/ Calculation 3  Recall 3  Language- name 2 objects 2  Language- repeat 1  Language- follow 3 step command 3  Language- read & follow direction 1  Write a sentence 1  Copy design 1  Total score 27    Past Medical History:  Diagnosis Date  . Anxiety and depression   . ASCUS with positive high risk HPV 09/2013  . Diabetes mellitus without complication ([place])    type 2  . Elevated blood sugar   . Family history of breast cancer in mother   . Fibroid uterus   . History of pulmonary embolus (PE)   . Hypertension   . Menopause   . Mild dysplasia of cervix 09/2013   repeat pap done -ecc  neg, cerival bx cin 1  . Retinal detachment        Medications: Outpatient Medications Prior to Visit  Medication Sig  . atorvastatin (LIPITOR) 40 MG tablet TAKE 1 TABLET BY MOUTH EVERY DAY  . buPROPion (WELLBUTRIN SR) 100 MG 12 hr tablet Take 1 tablet (100 mg total) by mouth 2 (two) times daily.  . Calcium Citrate-Vitamin D (CALCIUM + D PO) Take 1 tablet by mouth daily.  . Multiple Vitamins-Minerals (PRESERVISION/LUTEIN PO) Take by mouth daily.   . pioglitazone (ACTOS) 30 MG tablet Take 0.5 tablets (15 mg total) by mouth daily.  Marland Kitchen Apoaequorin (PREVAGEN) 10 MG CAPS Take 10 mg by mouth daily  at 12 noon. (Patient not taking: Reported on 06/09/2020)  . citalopram (CELEXA) 20 MG tablet TAKE 1/2 TABLET BY MOUTH EVERY DAY (Patient not taking: Reported on 06/19/2020)  . glucose blood (ONE TOUCH ULTRA TEST) test strip CHECK SUGAR ONCE DAILY (Patient not taking: Reported on 06/19/2020)  . JARDIANCE 25 MG TABS tablet TAKE 1 TABLET BY MOUTH DAILY (Patient not taking: Reported on 06/09/2020)  . lidocaine (LIDODERM) 5 % APPLY PATCH TO AFFECTED AREA WHEN AWAKE. (Patient not taking: Reported on 06/19/2020)  . metFORMIN (GLUCOPHAGE-XR) 500 MG 24 hr tablet TAKE 1 TABLET BY MOUTH EVERY DAY WITH BREAKFAST (Patient not taking: Reported on 06/19/2020)  . omeprazole (PRILOSEC) 20 MG capsule Take 1 capsule (20 mg total) by mouth daily. (Patient not taking: Reported on 06/19/2020)  . ONETOUCH DELICA LANCETS FINE MISC Check sugar once daily (Patient not taking: Reported on 06/19/2020)  . [DISCONTINUED] amLODipine (NORVASC) 2.5 MG tablet TAKE 1 TABLET BY MOUTH EVERY DAY (Patient not taking: Reported on 06/20/2020)   No facility-administered medications prior to visit.    Review of Systems  Constitutional: Negative for appetite change, chills, fatigue and fever.  Respiratory: Negative for chest tightness and shortness of breath.   Cardiovascular: Negative for chest pain and palpitations.  Gastrointestinal: Negative for abdominal pain, nausea and vomiting.  Neurological: Negative for dizziness and weakness.    Last hemoglobin A1c Lab Results  Component Value Date   HGBA1C 8.2 (A) 06/30/2020      Objective    There were no vitals taken for this visit. BP Readings from Last 3 Encounters:  06/09/20 132/75  05/08/20 (!) 134/75  03/31/20 130/76   Wt Readings from Last 3 Encounters:  06/09/20 154 lb 6.4 oz (70 kg)  05/08/20 153 lb (69.4 kg)  03/31/20 159 lb (72.1 kg)      Physical Exam Vitals reviewed.  Constitutional:      Appearance: Normal appearance. She is normal weight.  Eyes:     General: No  scleral icterus.    Conjunctiva/sclera: Conjunctivae normal.  Cardiovascular:     Rate and Rhythm: Normal rate and regular rhythm.     Pulses: Normal pulses.     Heart sounds: Normal heart sounds.  Pulmonary:     Effort: Pulmonary effort is normal.     Breath sounds: Normal breath sounds.  Abdominal:     General: Bowel sounds are normal.     Palpations: Abdomen is soft.  Musculoskeletal:     Cervical back: Normal range of motion and neck supple.     Right lower leg: No edema.     Left lower leg: No edema.  Skin:    General: Skin is warm and dry.  Neurological:     General: No focal deficit present.     Mental Status: She is alert and  oriented to person, place, and time.  Psychiatric:        Mood and Affect: Mood normal.        Behavior: Behavior normal.        Thought Content: Thought content normal.        Judgment: Judgment normal.     Comments:  patient is anxious today.       Results for orders placed or performed in visit on 06/30/20  POCT glycosylated hemoglobin (Hb A1C)  Result Value Ref Range   Hemoglobin A1C 8.2 (A) 4.0 - 5.6 %   Est. average glucose Bld gHb Est-mCnc 189     Assessment & Plan     1. Diabetes mellitus type 2 without retinopathy ([place]) Stable with A1c of 8.2.  We will have her check her sugars and will consider other medications going forward.  Continue Actos for now. - POCT glycosylated hemoglobin (Hb A1C)  2. Cerebrovascular accident (CVA), unspecified mechanism ([place]) Still seeing speech therapy - mirtazapine (REMERON) 30 MG tablet; Take 1 tablet (30 mg total) by mouth at bedtime.  Dispense: 30 tablet; Refill: 5  3. Essential hypertension Add back amlodipine at 5 mg daily - amLODipine (NORVASC) 5 MG tablet; Take 1 tablet (5 mg total) by mouth daily.  Dispense: 90 tablet; Refill: 1  4. Concussion without loss of consciousness, subsequent encounter I think this is continuing to affect the patient some.  Refer back to neurology for their  input.  5. Anxiety Add mirtazapine 30 mg nightly.  6. Reactive depression Consider referral to psychiatry.  I am also concerned about weight loss from 154 pounds a month ago to 142 pounds this month.  Hopefully mirtazapine will help her appetite.  No sign of systemic illness.   No follow-ups on file.      I, Wilhemena Durie, MD, have reviewed all documentation for this visit. The documentation on 07/03/20 for the exam, diagnosis, procedures, and orders are all accurate and complete.    Richard Cranford Mon, MD  [facility] [phone] (phone) [phone] (fax)  [place]

## 2020-06-27 NOTE — Therapy (Addendum)
[place] MAIN [facility] SERVICES [address] [place], [place], [address] Phone: [phone]   Fax:  [phone]  Speech Language Pathology Treatment  Patient Details  Name: Anna Williams MRN: [id number] Date of Birth: [birth year] Referring Provider (SLP): Miguel Aschoff   Encounter Date: 06/26/2020   End of Session - 06/27/20 1455    Visit Number 25    Number of Visits 39    Date for SLP Re-Evaluation 09/02/20    Authorization Type [employer] Medicare    Authorization Time Period 06/09/2020 thru 09/02/2020    Authorization - Visit Number 5    Progress Note Due on Visit 10    SLP Start Time 1000    SLP Stop Time  1130    SLP Time Calculation (min) 90 min    Activity Tolerance Patient tolerated treatment well           Past Medical History:  Diagnosis Date  . Anxiety and depression   . ASCUS with positive high risk HPV 09/2013  . Diabetes mellitus without complication ([place])    type 2  . Elevated blood sugar   . Family history of breast cancer in mother   . Fibroid uterus   . History of pulmonary embolus (PE)   . Hypertension   . Menopause   . Mild dysplasia of cervix 09/2013   repeat pap done -ecc neg, cerival bx cin 1  . Retinal detachment     Past Surgical History:  Procedure Laterality Date  . anterior neck fusion    . BREAST CYST ASPIRATION Left yrs ago  . CHOLECYSTECTOMY  2010  . DILATION AND CURETTAGE OF UTERUS    . HYSTEROSCOPY    . mole removed  2014    There were no vitals filed for this visit.   Subjective Assessment - 06/27/20 1453    Subjective Pt pleasant and upbeat until session began and cognitive deficits were being targeted    Patient is accompained by: Family member    Currently in Pain? No/denies                 ADULT SLP TREATMENT - 06/27/20 0001      General Information   Behavior/Cognition Alert;Cooperative;Pleasant mood    HPI Anna Williams is a 74 y.o. female with  medical history significant of anxiety disorder, diabetes, hypertension, hyperlipidemia, history of pulmonary embolism, with recent infarct in the left anterior basal ganglia involving lenticular nucleus and internal capsule (03/10/2020). Per chart, pt exhibited dysarthria which cleared during acute hospitalization. Of note pt fell striking back of head on 03/06/2020 with diagnosis of concussion and altered mental status.      Treatment Provided   Treatment provided Cognitive-Linquistic      Cognitive-Linquistic Treatment   Treatment focused on Cognition    Skilled Treatment Pt with no accuracy on 4 out of 4 episodes of charting BP and blood sugar d/t inability to write down information in appropriate manner, location on sheet (BP values on BP line etc), pt unaware that all entries were incorrect, ability improved with multiple repetitions        Assessment / Recommendations / Plan   Plan Continue with current plan of care      Progression Toward Goals   Progression toward goals Progressing toward goals            SLP Education - 06/27/20 1454    Education Details provided on erros made and cognitive deficits, recommended pt  consider counseling should emotional physical shaking continue    Person(s) Educated Patient;Spouse    Methods Explanation;Demonstration    Comprehension Need further instruction            SLP Short Term Goals - 06/10/20 1244      SLP SHORT TERM GOAL #4   Status Not Met      SLP SHORT TERM GOAL #5   Title Given minimal cues, pt will solve semi-complex reasoning tasks with 90% accuracy.    Baseline max to moderate cues    Time 10    Period --   sessions   Status New      Additional Short Term Goals   Additional Short Term Goals Yes      SLP SHORT TERM GOAL #6   Title With moderate cues, pt will demonstrate selective attention to task for 5 minutes in a mildly distractive environment in 7 out of 10 opportunities.    Baseline maximal cues for selective  attnetion for 2 minute intervals    Time 10    Period --   sessions   Status New      SLP SHORT TERM GOAL #7   Title With moderate cues, pt will utilize memory strategy to recall basic daily information with 75% accuracy.    Baseline maximal assistance    Time 10    Period --   sessions   Status New      SLP SHORT TERM GOAL #8   Title With minimal cues, pt will verbally organize basic information to increase listener understanding in 7 out of 10 opportunities.    Baseline new goal    Time 10    Period --   sessions   Status New            SLP Long Term Goals - 06/10/20 1255      SLP LONG TERM GOAL #1   Title Pt will identify cognitive-communication barriers and participate in developing compensatory strategies.    Baseline re-addressing goal    Time 12    Period Weeks    Status Revised    Target Date 09/02/20      SLP LONG TERM GOAL #2   Title Pt will increase executive cognitive functions to complete complex reasoning tasks.    Baseline moderate deficits in executive functions    Time 12    Period Weeks    Status Partially Met    Target Date 09/02/20      SLP LONG TERM GOAL #3   Title Pt will use memory strategies with minimal assistance to recall basic daily information.    Baseline new goal    Time 12    Period Weeks    Status New    Target Date 09/02/20            Plan - 06/27/20 1456    Clinical Impression Statement Skilled treatment session targeted pt's moderate cognitive deficits. SLP facilitated session by providing review of pt's charting in previously created notebook. Pt was provided with a total of 4 opportunities to chart time, BP and blood sugar. Of these 4 opportunities, pt didn't complete any of them correctly. Pt unaware of errors until SLP pointed them out. As she realized the extent of her cognitive deficits within the task, she remained stoic, but she began trembling. Emotional support provided but as pt gains more awareness of her deficits,  she may benefit from counseling to help with her ability to cope.  Speech Therapy Frequency 2x / week    Duration 12 weeks    Treatment/Interventions Cognitive reorganization;Patient/family education;SLP instruction and feedback    Potential to Achieve Goals Good    Potential Considerations Ability to learn/carryover information    SLP Home Exercise Plan increase accuracy when charting information    Consulted and Agree with Plan of Care Patient;Family member/caregiver    Family Member Consulted pt's husband           Patient will benefit from skilled therapeutic intervention in order to improve the following deficits and impairments:   Cognitive communication deficit    Problem List Patient Active Problem List   Diagnosis Date Noted  . CVA (cerebral vascular accident) ([place]) 03/10/2020  . Vaginal atrophy 04/29/2016  . Fibroid, uterine 04/29/2016  . Pelvic pain in female 04/29/2016  . Hypertension 10/16/2015  . Diabetes mellitus type 2 without retinopathy ([place]) 05/27/2015  . Menopause 04/29/2015  . Fibroids 04/29/2015  . Anxiety 04/29/2015  . Cervical dysplasia 04/29/2015  . History of pulmonary embolus (PE) 04/29/2015  . Family history of breast cancer in mother 04/29/2015  . Retinal detachment 04/29/2015   Zakhari Fogel B. Rutherford Nail M.S., CCC-SLP, [place] Pathologist Rehabilitation Services Office [phone]  Stormy Fabian 06/27/2020, 3:16 PM  [place] MAIN [facility] SERVICES [address] [place], [place], [address] Phone: [phone]   Fax:  [phone]   Name: Anna Williams MRN: [id number] Date of Birth: March 15, 1946

## 2020-06-30 ENCOUNTER — Ambulatory Visit (INDEPENDENT_AMBULATORY_CARE_PROVIDER_SITE_OTHER): Payer: Medicare Other | Admitting: Family Medicine

## 2020-06-30 ENCOUNTER — Encounter: Payer: Self-pay | Admitting: Family Medicine

## 2020-06-30 ENCOUNTER — Ambulatory Visit: Payer: Self-pay | Admitting: Family Medicine

## 2020-06-30 ENCOUNTER — Other Ambulatory Visit: Payer: Self-pay

## 2020-06-30 DIAGNOSIS — E119 Type 2 diabetes mellitus without complications: Secondary | ICD-10-CM

## 2020-06-30 DIAGNOSIS — I10 Essential (primary) hypertension: Secondary | ICD-10-CM

## 2020-06-30 DIAGNOSIS — I639 Cerebral infarction, unspecified: Secondary | ICD-10-CM | POA: Diagnosis not present

## 2020-06-30 DIAGNOSIS — F329 Major depressive disorder, single episode, unspecified: Secondary | ICD-10-CM

## 2020-06-30 DIAGNOSIS — F419 Anxiety disorder, unspecified: Secondary | ICD-10-CM

## 2020-06-30 DIAGNOSIS — S060X0D Concussion without loss of consciousness, subsequent encounter: Secondary | ICD-10-CM

## 2020-06-30 LAB — POCT GLYCOSYLATED HEMOGLOBIN (HGB A1C)
Est. average glucose Bld gHb Est-mCnc: 189
Hemoglobin A1C: 8.2 % — AB (ref 4.0–5.6)

## 2020-06-30 MED ORDER — MIRTAZAPINE 30 MG PO TABS: 30.0000 mg | ORAL_TABLET | Freq: Every day | ORAL | 5 refills | Status: DC

## 2020-06-30 MED ORDER — AMLODIPINE BESYLATE 5 MG PO TABS: 5.0000 mg | ORAL_TABLET | Freq: Every day | ORAL | 1 refills | Status: DC

## 2020-07-01 ENCOUNTER — Ambulatory Visit: Payer: Self-pay | Admitting: Family Medicine

## 2020-07-02 ENCOUNTER — Other Ambulatory Visit: Payer: Self-pay

## 2020-07-02 ENCOUNTER — Ambulatory Visit: Payer: Medicare Other | Admitting: Speech Pathology

## 2020-07-02 DIAGNOSIS — R41841 Cognitive communication deficit: Secondary | ICD-10-CM | POA: Diagnosis not present

## 2020-07-03 ENCOUNTER — Encounter: Payer: Self-pay | Admitting: Speech Pathology

## 2020-07-03 ENCOUNTER — Encounter: Payer: Medicare Other | Admitting: Speech Pathology

## 2020-07-04 ENCOUNTER — Other Ambulatory Visit: Payer: Self-pay

## 2020-07-04 ENCOUNTER — Encounter: Payer: Self-pay | Admitting: Speech Pathology

## 2020-07-04 ENCOUNTER — Ambulatory Visit: Payer: Medicare Other | Admitting: Speech Pathology

## 2020-07-04 DIAGNOSIS — R41841 Cognitive communication deficit: Secondary | ICD-10-CM

## 2020-07-04 NOTE — Therapy (Signed)
[place] MAIN  [facility] SERVICES [address] [place], [place], [address] Phone: [phone]   Fax:  [phone]  Speech Language Pathology Treatment  Patient Details  Name: Anna Williams MRN: [id number] Date of Birth: Nov 15, 1945 Referring Provider (SLP): Miguel Aschoff   Encounter Date: 07/04/2020   End of Session - 07/04/20 1451    Visit Number 27    Number of Visits 11    Date for SLP Re-Evaluation 09/02/20    Authorization Type [employer]    Authorization Time Period 06/09/2020 thru 09/02/2020    Authorization - Visit Number 7    Progress Note Due on Visit 10    SLP Start Time 1005    SLP Stop Time  1120    SLP Time Calculation (min) 75 min    Activity Tolerance Patient tolerated treatment well           Past Medical History:  Diagnosis Date  . Anxiety and depression   . ASCUS with positive high risk HPV 09/2013  . Diabetes mellitus without complication ([place])    type 2  . Elevated blood sugar   . Family history of breast cancer in mother   . Fibroid uterus   . History of pulmonary embolus (PE)   . Hypertension   . Menopause   . Mild dysplasia of cervix 09/2013   repeat pap done -ecc neg, cerival bx cin 1  . Retinal detachment     Past Surgical History:  Procedure Laterality Date  . anterior neck fusion    . BREAST CYST ASPIRATION Left yrs ago  . CHOLECYSTECTOMY  2010  . DILATION AND CURETTAGE OF UTERUS    . HYSTEROSCOPY    . mole removed  2014    There were no vitals filed for this visit.   Subjective Assessment - 07/04/20 1449    Subjective Pt is more conversant and appears in much better spirits    Patient is accompained by: Family member    Currently in Pain? No/denies                 ADULT SLP TREATMENT - 07/04/20 1449      General Information   Behavior/Cognition Alert;Cooperative;Pleasant mood    HPI Anna Williams is a 74 y.o. female with medical history significant of  anxiety disorder, diabetes, hypertension, hyperlipidemia, history of pulmonary embolism, with recent infarct in the left anterior basal ganglia involving lenticular nucleus and internal capsule (03/10/2020). Per chart, pt exhibited dysarthria which cleared during acute hospitalization. Of note pt fell striking back of head on 03/06/2020 with diagnosis of concussion and altered mental status.      Treatment Provided   Treatment provided Cognitive-Linquistic      Pain Assessment   Pain Assessment No/denies pain      Cognitive-Linquistic Treatment   Treatment focused on Cognition    Skilled Treatment Independently sustained attention to semi-complex problem solving task for 25 minutes without any need for re-direction, much improved executive functions, including self awareness and self0correcting      Assessment / Recommendations / Plan   Plan Continue with current plan of care      Progression Toward Goals   Progression toward goals Progressing toward goals            SLP Education - 07/04/20 1451    Education Details great progress during this session    Person(s) Educated Patient;Spouse    Methods Explanation;Demonstration    Comprehension Verbalized understanding  SLP Short Term Goals - 06/10/20 1244      SLP SHORT TERM GOAL #4   Status Not Met      SLP SHORT TERM GOAL #5   Title Given minimal cues, pt will solve semi-complex reasoning tasks with 90% accuracy.    Baseline max to moderate cues    Time 10    Period --   sessions   Status New      Additional Short Term Goals   Additional Short Term Goals Yes      SLP SHORT TERM GOAL #6   Title With moderate cues, pt will demonstrate selective attention to task for 5 minutes in a mildly distractive environment in 7 out of 10 opportunities.    Baseline maximal cues for selective attnetion for 2 minute intervals    Time 10    Period --   sessions   Status New      SLP SHORT TERM GOAL #7   Title With moderate  cues, pt will utilize memory strategy to recall basic daily information with 75% accuracy.    Baseline maximal assistance    Time 10    Period --   sessions   Status New      SLP SHORT TERM GOAL #8   Title With minimal cues, pt will verbally organize basic information to increase listener understanding in 7 out of 10 opportunities.    Baseline new goal    Time 10    Period --   sessions   Status New            SLP Long Term Goals - 06/10/20 1255      SLP LONG TERM GOAL #1   Title Pt will identify cognitive-communication barriers and participate in developing compensatory strategies.    Baseline re-addressing goal    Time 12    Period Weeks    Status Revised    Target Date 09/02/20      SLP LONG TERM GOAL #2   Title Pt will increase executive cognitive functions to complete complex reasoning tasks.    Baseline moderate deficits in executive functions    Time 12    Period Weeks    Status Partially Met    Target Date 09/02/20      SLP LONG TERM GOAL #3   Title Pt will use memory strategies with minimal assistance to recall basic daily information.    Baseline new goal    Time 12    Period Weeks    Status New    Target Date 09/02/20            Plan - 07/04/20 1452    Clinical Impression Statement Skilled treatment session focused on pt's ability to sustain attention to task as well as improviding executive functions. Pt continues to show more stable emotions during session. SLP facilitated session by providing word search to which pt was able to sustain attention for 25 minutes wihtout any redirection to task. This is vast improvement in ability. Additionally, she demonstrated self-awareness and effectively finished the puzzle.    Speech Therapy Frequency 2x / week    Duration 12 weeks    Treatment/Interventions Cognitive reorganization;Patient/family education;SLP instruction and feedback    Potential to [place] continue  practicing sustained attention to tasks    Consulted and Agree with Plan of Care Patient;Family member/caregiver    Family Member Consulted pt's husband  Patient will benefit from skilled therapeutic intervention in order to improve the following deficits and impairments:   Cognitive communication deficit    Problem List Patient Active Problem List   Diagnosis Date Noted  . CVA (cerebral vascular accident) ([place]) 03/10/2020  . Vaginal atrophy 04/29/2016  . Fibroid, uterine 04/29/2016  . Pelvic pain in female 04/29/2016  . Hypertension 10/16/2015  . Diabetes mellitus type 2 without retinopathy ([place]) 05/27/2015  . Menopause 04/29/2015  . Fibroids 04/29/2015  . Anxiety 04/29/2015  . Cervical dysplasia 04/29/2015  . History of pulmonary embolus (PE) 04/29/2015  . Family history of breast cancer in mother 04/29/2015  . Retinal detachment 04/29/2015   Koren Plyler B. Rutherford Nail M.S., CCC-SLP, [place] Pathologist Rehabilitation Services Office [phone]   Stormy Fabian 07/04/2020, 2:55 PM  [place] MAIN [facility] SERVICES [address] [place], [place], [address] Phone: [phone]   Fax:  [phone]   Name: Anna Williams MRN: [id number] Date of Birth: 1946/05/16

## 2020-07-04 NOTE — Therapy (Signed)
[place] MAIN [facility] SERVICES [address] [place], [place], [address] Phone: [phone]   Fax:  [phone]  Speech Language Pathology Treatment  Patient Details  Name: Anna Williams MRN: [id number] Date of Birth: [birth year] Referring Provider (SLP): Miguel Aschoff   Encounter Date: 07/02/2020   End of Session - 07/04/20 1317    Visit Number 26    Number of Visits 72    Date for SLP Re-Evaluation 09/02/20    Authorization Type [employer] Medicare    Authorization Time Period 06/09/2020 thru 09/02/2020    Authorization - Visit Number 6    Progress Note Due on Visit 10    SLP Start Time 1300    SLP Stop Time  1400    SLP Time Calculation (min) 60 min    Activity Tolerance Patient tolerated treatment well           Past Medical History:  Diagnosis Date  . Anxiety and depression   . ASCUS with positive high risk HPV 09/2013  . Diabetes mellitus without complication ([place])    type 2  . Elevated blood sugar   . Family history of breast cancer in mother   . Fibroid uterus   . History of pulmonary embolus (PE)   . Hypertension   . Menopause   . Mild dysplasia of cervix 09/2013   repeat pap done -ecc neg, cerival bx cin 1  . Retinal detachment     Past Surgical History:  Procedure Laterality Date  . anterior neck fusion    . BREAST CYST ASPIRATION Left yrs ago  . CHOLECYSTECTOMY  2010  . DILATION AND CURETTAGE OF UTERUS    . HYSTEROSCOPY    . mole removed  2014    There were no vitals filed for this visit.   Subjective Assessment - 07/03/20 1603    Subjective Pt is more conversant and appears in much better spirits    Patient is accompained by: Family member    Currently in Pain? No/denies                 ADULT SLP TREATMENT - 07/04/20 0001      General Information   Behavior/Cognition Alert;Cooperative;Pleasant mood    HPI Anna Williams is a 74 y.o. female with medical history significant of  anxiety disorder, diabetes, hypertension, hyperlipidemia, history of pulmonary embolism, with recent infarct in the left anterior basal ganglia involving lenticular nucleus and internal capsule (03/10/2020). Per chart, pt exhibited dysarthria which cleared during acute hospitalization. Of note pt fell striking back of head on 03/06/2020 with diagnosis of concussion and altered mental status.      Treatment Provided   Treatment provided Cognitive-Linquistic      Pain Assessment   Pain Assessment No/denies pain      Cognitive-Linquistic Treatment   Treatment focused on Cognition    Skilled Treatment great recall of information from MD appt, recalled several magazine articles and details from these without cues from SLP      Assessment / Recommendations / [place] with current plan of care      Progression Toward Goals   Progression toward goals Progressing toward goals            SLP Education - 07/04/20 1317    Education Details provided on ways to increase cognitive function within everyday activities    Person(s) Educated Patient;Spouse    Methods Explanation;Demonstration    Comprehension Verbalized understanding  SLP Short Term Goals - 06/10/20 1244      SLP SHORT TERM GOAL #4   Status Not Met      SLP SHORT TERM GOAL #5   Title Given minimal cues, pt will solve semi-complex reasoning tasks with 90% accuracy.    Baseline max to moderate cues    Time 10    Period --   sessions   Status New      Additional Short Term Goals   Additional Short Term Goals Yes      SLP SHORT TERM GOAL #6   Title With moderate cues, pt will demonstrate selective attention to task for 5 minutes in a mildly distractive environment in 7 out of 10 opportunities.    Baseline maximal cues for selective attnetion for 2 minute intervals    Time 10    Period --   sessions   Status New      SLP SHORT TERM GOAL #7   Title With moderate cues, pt will utilize memory strategy  to recall basic daily information with 75% accuracy.    Baseline maximal assistance    Time 10    Period --   sessions   Status New      SLP SHORT TERM GOAL #8   Title With minimal cues, pt will verbally organize basic information to increase listener understanding in 7 out of 10 opportunities.    Baseline new goal    Time 10    Period --   sessions   Status New            SLP Long Term Goals - 06/10/20 1255      SLP LONG TERM GOAL #1   Title Pt will identify cognitive-communication barriers and participate in developing compensatory strategies.    Baseline re-addressing goal    Time 12    Period Weeks    Status Revised    Target Date 09/02/20      SLP LONG TERM GOAL #2   Title Pt will increase executive cognitive functions to complete complex reasoning tasks.    Baseline moderate deficits in executive functions    Time 12    Period Weeks    Status Partially Met    Target Date 09/02/20      SLP LONG TERM GOAL #3   Title Pt will use memory strategies with minimal assistance to recall basic daily information.    Baseline new goal    Time 12    Period Weeks    Status New    Target Date 09/02/20            Plan - 07/04/20 1319    Clinical Impression Statement Skilled treatment session targeted pt's cognitive communication goals. Pt arrived to session appearing in a much brighter mood than previous session. She rpoerts speaking to a friend who has fully recovered from a stroke that occured 15 years prior. She reports that her friend has not had another stroke since first stroke. This appeared to put pt's mind at ease. Additionally, pt was able to independently recall information from recent MD appointment and independently recall information from magazine article without any cues in a precise order and vivid details. SLP further facilitated session by introducing novel word search. Pt unfamiliar with word searches but was able to begin returning demonstration of concept.      Speech Therapy Frequency 2x / week    Duration 12 weeks    Treatment/Interventions Cognitive reorganization;Patient/family education;SLP instruction and feedback  Potential to Achieve Goals Good    SLP Home Exercise Plan continue practicing sustained attention to tasks    Consulted and Agree with Plan of Care Patient;Family member/caregiver    Family Member Consulted pt's husband           Patient will benefit from skilled therapeutic intervention in order to improve the following deficits and impairments:   Cognitive communication deficit    Problem List Patient Active Problem List   Diagnosis Date Noted  . CVA (cerebral vascular accident) ([place]) 03/10/2020  . Vaginal atrophy 04/29/2016  . Fibroid, uterine 04/29/2016  . Pelvic pain in female 04/29/2016  . Hypertension 10/16/2015  . Diabetes mellitus type 2 without retinopathy ([place]) 05/27/2015  . Menopause 04/29/2015  . Fibroids 04/29/2015  . Anxiety 04/29/2015  . Cervical dysplasia 04/29/2015  . History of pulmonary embolus (PE) 04/29/2015  . Family history of breast cancer in mother 04/29/2015  . Retinal detachment 04/29/2015   Anna Williams M.S., CCC-SLP, [place] Pathologist Rehabilitation Services Office [phone]  Stormy Fabian 07/04/2020, 1:23 PM  [place] MAIN [facility] SERVICES [address] [place], [place], [address] Phone: [phone]   Fax:  [phone]   Name: UBAH RADKE MRN: [id number] Date of Birth: 03-Dec-1945

## 2020-07-07 ENCOUNTER — Telehealth: Payer: Self-pay | Admitting: Family Medicine

## 2020-07-07 NOTE — Telephone Encounter (Signed)
Copied from [place] [id number]. Topic: Medicare AWV >> Jul 07, 2020  3:20 PM Cher Nakai R wrote: Reason for CRM:  Left message for patient to call back and schedule Medicare Annual Wellness Visit (AWV) either virtually or in office.  Last AWV  07/09/2019  Please schedule at anytime with [facility] Health Advisor.  If any questions, please contact me at [phone]

## 2020-07-07 NOTE — Telephone Encounter (Deleted)
Copied from [place] [id number]. Topic: Medicare AWV >> Jul 07, 2020  3:20 PM Cher Nakai R wrote: Reason for CRM:  Left message for patient to call back and schedule Medicare Annual Wellness Visit (AWV) either virtually or in office.  Last AWV  07/09/2019  Please schedule at anytime with [facility] Health Advisor.  If any questions, please contact me at [phone]

## 2020-07-08 ENCOUNTER — Other Ambulatory Visit: Payer: Self-pay

## 2020-07-08 ENCOUNTER — Encounter: Payer: Self-pay | Admitting: Family Medicine

## 2020-07-08 ENCOUNTER — Ambulatory Visit: Payer: Medicare Other | Admitting: Speech Pathology

## 2020-07-08 ENCOUNTER — Telehealth: Payer: Self-pay

## 2020-07-08 DIAGNOSIS — R41841 Cognitive communication deficit: Secondary | ICD-10-CM | POA: Diagnosis not present

## 2020-07-08 NOTE — Telephone Encounter (Signed)
Okay to try half the dose if she is able to split the pill

## 2020-07-08 NOTE — Telephone Encounter (Signed)
Copied from [place] [id number]. Topic: General - Other >> Jul 08, 2020  8:07 AM Leward Quan A wrote: Reason for CRM: Patient husband Randa Lynn called to inquire of Dr Rosanna Randy if could please call to discuss changing the buPROPion [facility] SR) 100 MG 12 hr tablet for something else since this medication is causing patient to have a high heart rate and they are concerned. Please call Ph# [phone]

## 2020-07-09 ENCOUNTER — Other Ambulatory Visit: Payer: Self-pay | Admitting: Family Medicine

## 2020-07-09 ENCOUNTER — Encounter: Payer: Self-pay | Admitting: Speech Pathology

## 2020-07-09 NOTE — Telephone Encounter (Signed)
Just stopped the Wellbutrin.  Hopefully the mirtazapine will kick in soon

## 2020-07-09 NOTE — Therapy (Signed)
[place] MAIN [facility] SERVICES [address] [place], [place], [address] Phone: [phone]   Fax:  [phone]  Speech Language Pathology Treatment  Patient Details  Name: Anna Williams MRN: [id number] Date of Birth: 05/25/1946 Referring Provider (SLP): Miguel Aschoff   Encounter Date: 07/08/2020   End of Session - 07/09/20 0835    Visit Number 28    Number of Visits 62    Date for SLP Re-Evaluation 09/02/20    Authorization Type [employer] Medicare    Authorization Time Period 06/09/2020 thru 09/02/2020    Authorization - Visit Number 8    Progress Note Due on Visit 10    SLP Start Time 1000    SLP Stop Time  1100    SLP Time Calculation (min) 60 min    Activity Tolerance Patient tolerated treatment well           Past Medical History:  Diagnosis Date  . Anxiety and depression   . ASCUS with positive high risk HPV 09/2013  . Diabetes mellitus without complication ([place])    type 2  . Elevated blood sugar   . Family history of breast cancer in mother   . Fibroid uterus   . History of pulmonary embolus (PE)   . Hypertension   . Menopause   . Mild dysplasia of cervix 09/2013   repeat pap done -ecc neg, cerival bx cin 1  . Retinal detachment     Past Surgical History:  Procedure Laterality Date  . anterior neck fusion    . BREAST CYST ASPIRATION Left yrs ago  . CHOLECYSTECTOMY  2010  . DILATION AND CURETTAGE OF UTERUS    . HYSTEROSCOPY    . mole removed  2014    There were no vitals filed for this visit.   Subjective Assessment - 07/09/20 0815    Subjective Pt is more conversant and appears in much better spirits    Patient is accompained by: Family member    Currently in Pain? No/denies                 ADULT SLP TREATMENT - 07/09/20 0001      General Information   Behavior/Cognition Alert;Cooperative;Pleasant mood    HPI Anna Williams is a 74 y.o. female with medical history significant of  anxiety disorder, diabetes, hypertension, hyperlipidemia, history of pulmonary embolism, with recent infarct in the left anterior basal ganglia involving lenticular nucleus and internal capsule (03/10/2020). Per chart, pt exhibited dysarthria which cleared during acute hospitalization. Of note pt fell striking back of head on 03/06/2020 with diagnosis of concussion and altered mental status.      Treatment Provided   Treatment provided Cognitive-Linquistic      Pain Assessment   Pain Assessment No/denies pain      Cognitive-Linquistic Treatment   Treatment focused on Cognition    Skilled Treatment Pt independent with selective attention to task for 30 minutes in moderately distracting environment; independent recall and execution of problem solving semi-complex task, Total assistance required to use MyChart app       Assessment / Recommendations / Plan   Plan Continue with current plan of care      Progression Toward Goals   Progression toward goals Progressing toward goals            SLP Education - 07/09/20 0834    Education Details how to use    Person(s) Educated Patient;Spouse    Methods Explanation;Demonstration  Comprehension Verbalized understanding            SLP Short Term Goals - 06/10/20 1244      SLP SHORT TERM GOAL #4   Status Not Met      SLP SHORT TERM GOAL #5   Title Given minimal cues, pt will solve semi-complex reasoning tasks with 90% accuracy.    Baseline max to moderate cues    Time 10    Period --   sessions   Status New      Additional Short Term Goals   Additional Short Term Goals Yes      SLP SHORT TERM GOAL #6   Title With moderate cues, pt will demonstrate selective attention to task for 5 minutes in a mildly distractive environment in 7 out of 10 opportunities.    Baseline maximal cues for selective attnetion for 2 minute intervals    Time 10    Period --   sessions   Status New      SLP SHORT TERM GOAL #7   Title With moderate cues,  pt will utilize memory strategy to recall basic daily information with 75% accuracy.    Baseline maximal assistance    Time 10    Period --   sessions   Status New      SLP SHORT TERM GOAL #8   Title With minimal cues, pt will verbally organize basic information to increase listener understanding in 7 out of 10 opportunities.    Baseline new goal    Time 10    Period --   sessions   Status New            SLP Long Term Goals - 06/10/20 1255      SLP LONG TERM GOAL #1   Title Pt will identify cognitive-communication barriers and participate in developing compensatory strategies.    Baseline re-addressing goal    Time 12    Period Weeks    Status Revised    Target Date 09/02/20      SLP LONG TERM GOAL #2   Title Pt will increase executive cognitive functions to complete complex reasoning tasks.    Baseline moderate deficits in executive functions    Time 12    Period Weeks    Status Partially Met    Target Date 09/02/20      SLP LONG TERM GOAL #3   Title Pt will use memory strategies with minimal assistance to recall basic daily information.    Baseline new goal    Time 12    Period Weeks    Status New    Target Date 09/02/20            Plan - 07/09/20 0839    Clinical Impression Statement Skilled treatment session specifically targeted pt's ability to selectively attend to semi-complex tasks in a moderately distracting environment. Pt with great progress as she was able to selectively attend for ~30 minutes despite increased distractions. Pt also independently completed semi-complex task. Additionally, she was able to describe the sensation of heart racing in a concise, sequential way that enabled immediate listener understanding.    Speech Therapy Frequency 2x / week    Duration 12 weeks    Treatment/Interventions Cognitive reorganization;Patient/family education;SLP instruction and feedback    Potential to Achieve Goals Good    Potential Considerations Ability to  learn/carryover information    SLP Home Exercise Plan continue practicing selective attention to tasks with increased distractions    Consulted  and Agree with Plan of Care Patient;Family member/caregiver    Family Member Consulted pt's husband           Patient will benefit from skilled therapeutic intervention in order to improve the following deficits and impairments:   Cognitive communication deficit    Problem List Patient Active Problem List   Diagnosis Date Noted  . CVA (cerebral vascular accident) ([place]) 03/10/2020  . Vaginal atrophy 04/29/2016  . Fibroid, uterine 04/29/2016  . Pelvic pain in female 04/29/2016  . Hypertension 10/16/2015  . Diabetes mellitus type 2 without retinopathy ([place]) 05/27/2015  . Menopause 04/29/2015  . Fibroids 04/29/2015  . Anxiety 04/29/2015  . Cervical dysplasia 04/29/2015  . History of pulmonary embolus (PE) 04/29/2015  . Family history of breast cancer in mother 04/29/2015  . Retinal detachment 04/29/2015   Hellen Shanley B. Rutherford Nail M.S., CCC-SLP, [place] Pathologist Rehabilitation Services Office [phone]  Stormy Fabian 07/09/2020, 8:41 AM  [place] MAIN [facility] SERVICES [address] [place], [place], [address] Phone: [phone]   Fax:  [phone]   Name: ANAMIKA KUEKER MRN: [id number] Date of Birth: [birth year]

## 2020-07-09 NOTE — Telephone Encounter (Signed)
Patient advised through mychart message.

## 2020-07-09 NOTE — Telephone Encounter (Signed)
Requested Prescriptions  Pending Prescriptions Disp Refills  . buPROPion (WELLBUTRIN SR) 100 MG 12 hr tablet [Pharmacy Med Name: BUPROPION HCL SR 100 MG TABLET] 180 tablet 2    Sig: TAKE 1 TABLET BY MOUTH TWICE A DAY     Psychiatry: Antidepressants - bupropion Passed - 07/09/2020 10:33 AM      Passed - Last BP in normal range    BP Readings from Last 1 Encounters:  06/09/20 132/75         Passed - Valid encounter within last 6 months    Recent Outpatient Visits          1 week ago Diabetes mellitus type 2 without retinopathy [facility])   [facility] Jerrol Banana., MD   1 month ago Diabetes mellitus type 2 without retinopathy [facility])   [facility] Jerrol Banana., MD   2 months ago Altered mental status, unspecified altered mental status type   [facility] Jerrol Banana., MD   3 months ago CVA, old, cognitive deficits   [facility] Jerrol Banana., MD   3 months ago Cerebrovascular accident (CVA), unspecified mechanism [facility])   [facility] Jerrol Banana., MD      Future Appointments            In 3 weeks Jerrol Banana., MD [facility] [place] request 90 day fills

## 2020-07-10 ENCOUNTER — Encounter: Payer: Self-pay | Admitting: Speech Pathology

## 2020-07-10 ENCOUNTER — Ambulatory Visit: Payer: Medicare Other | Admitting: Speech Pathology

## 2020-07-10 ENCOUNTER — Other Ambulatory Visit: Payer: Self-pay

## 2020-07-10 DIAGNOSIS — R41841 Cognitive communication deficit: Secondary | ICD-10-CM

## 2020-07-10 NOTE — Therapy (Signed)
[place] MAIN [facility] SERVICES [address] [place], [place], [address] Phone: [phone]   Fax:  [phone]  Speech Language Pathology Treatment  Patient Details  Name: Anna Williams MRN: [id number] Date of Birth: 1945/12/21 Referring Provider (SLP): Miguel Aschoff   Encounter Date: 07/10/2020   End of Session - 07/10/20 1744    Visit Number 29    Number of Visits 45    Date for SLP Re-Evaluation 09/02/20    Authorization Type [employer]    Authorization Time Period 06/09/2020 thru 09/02/2020    Authorization - Visit Number 9    Progress Note Due on Visit 10    SLP Start Time 1000    SLP Stop Time  1100    SLP Time Calculation (min) 60 min    Activity Tolerance Patient tolerated treatment well           Past Medical History:  Diagnosis Date  . Anxiety and depression   . ASCUS with positive high risk HPV 09/2013  . Diabetes mellitus without complication ([place])    type 2  . Elevated blood sugar   . Family history of breast cancer in mother   . Fibroid uterus   . History of pulmonary embolus (PE)   . Hypertension   . Menopause   . Mild dysplasia of cervix 09/2013   repeat pap done -ecc neg, cerival bx cin 1  . Retinal detachment     Past Surgical History:  Procedure Laterality Date  . anterior neck fusion    . BREAST CYST ASPIRATION Left yrs ago  . CHOLECYSTECTOMY  2010  . DILATION AND CURETTAGE OF UTERUS    . HYSTEROSCOPY    . mole removed  2014    There were no vitals filed for this visit.   Subjective Assessment - 07/10/20 1736    Subjective Pt is more conversant and appears in much better spirits    Patient is accompained by: Family member    Currently in Pain? No/denies                 ADULT SLP TREATMENT - 07/10/20 0001      General Information   Behavior/Cognition Alert;Cooperative;Pleasant mood    HPI Anna Williams is a 74 y.o. female with medical history significant of  anxiety disorder, diabetes, hypertension, hyperlipidemia, history of pulmonary embolism, with recent infarct in the left anterior basal ganglia involving lenticular nucleus and internal capsule (03/10/2020). Per chart, pt exhibited dysarthria which cleared during acute hospitalization. Of note pt fell striking back of head on 03/06/2020 with diagnosis of concussion and altered mental status.      Treatment Provided   Treatment provided Cognitive-Linquistic      Pain Assessment   Pain Assessment No/denies pain      Cognitive-Linquistic Treatment   Treatment focused on Cognition    Skilled Treatment Pt independent with logging into and navigating MyChart; moderately independent with downloading Pinterest app and total assistance required to help pt use calendar on her phone.        Assessment / Recommendations / Plan   Plan Continue with current plan of care      Progression Toward Goals   Progression toward goals Progressing toward goals            SLP Education - 07/10/20 1743    Education Details need for concise information in calendar on cell phone    Person(s) Educated Patient;Spouse    Methods  Explanation;Demonstration;Verbal cues;Tactile cues;Handout    Comprehension Verbal cues required;Tactile cues required            SLP Short Term Goals - 06/10/20 1244      SLP SHORT TERM GOAL #4   Status Not Met      SLP SHORT TERM GOAL #5   Title Given minimal cues, pt will solve semi-complex reasoning tasks with 90% accuracy.    Baseline max to moderate cues    Time 10    Period --   sessions   Status New      Additional Short Term Goals   Additional Short Term Goals Yes      SLP SHORT TERM GOAL #6   Title With moderate cues, pt will demonstrate selective attention to task for 5 minutes in a mildly distractive environment in 7 out of 10 opportunities.    Baseline maximal cues for selective attnetion for 2 minute intervals    Time 10    Period --   sessions   Status New        SLP SHORT TERM GOAL #7   Title With moderate cues, pt will utilize memory strategy to recall basic daily information with 75% accuracy.    Baseline maximal assistance    Time 10    Period --   sessions   Status New      SLP SHORT TERM GOAL #8   Title With minimal cues, pt will verbally organize basic information to increase listener understanding in 7 out of 10 opportunities.    Baseline new goal    Time 10    Period --   sessions   Status New            SLP Long Term Goals - 06/10/20 1255      SLP LONG TERM GOAL #1   Title Pt will identify cognitive-communication barriers and participate in developing compensatory strategies.    Baseline re-addressing goal    Time 12    Period Weeks    Status Revised    Target Date 09/02/20      SLP LONG TERM GOAL #2   Title Pt will increase executive cognitive functions to complete complex reasoning tasks.    Baseline moderate deficits in executive functions    Time 12    Period Weeks    Status Partially Met    Target Date 09/02/20      SLP LONG TERM GOAL #3   Title Pt will use memory strategies with minimal assistance to recall basic daily information.    Baseline new goal    Time 12    Period Weeks    Status New    Target Date 09/02/20            Plan - 07/10/20 1746    Clinical Impression Statement Skilled treatment session targeted pt's cognitive communication goals. SLP facilitated by assigning pt task of finding 1 recipe, save into her board, make recipe and answer list of questions about recipe. Questions created included severity rating, yes/no, never/maybe/yes, and if yes, provide name. Total assistance required for pt to effectively use calendar of her cell phone. Assignment given for pt to effectively enter all ST session into calendar for October.    Speech Therapy Frequency 2x / week    Duration 12 weeks    Treatment/Interventions Cognitive reorganization;Patient/family education;SLP instruction and feedback     Potential to Achieve Goals Good    Potential Considerations Ability to learn/carryover information  SLP Home Exercise Plan select recipe on Pinterest, make it, rate it and enter all ST sessions in October in her phone    Consulted and Agree with Plan of Care Patient;Family member/caregiver    Family Member Consulted pt's husband           Patient will benefit from skilled therapeutic intervention in order to improve the following deficits and impairments:   Cognitive communication deficit    Problem List Patient Active Problem List   Diagnosis Date Noted  . CVA (cerebral vascular accident) ([place]) 03/10/2020  . Vaginal atrophy 04/29/2016  . Fibroid, uterine 04/29/2016  . Pelvic pain in female 04/29/2016  . Hypertension 10/16/2015  . Diabetes mellitus type 2 without retinopathy ([place]) 05/27/2015  . Menopause 04/29/2015  . Fibroids 04/29/2015  . Anxiety 04/29/2015  . Cervical dysplasia 04/29/2015  . History of pulmonary embolus (PE) 04/29/2015  . Family history of breast cancer in mother 04/29/2015  . Retinal detachment 04/29/2015   Lauryl Seyer B. Rutherford Nail M.S., CCC-SLP, [place] Pathologist Rehabilitation Services Office [phone]   Stormy Fabian 07/10/2020, 5:48 PM  [place] MAIN [facility] SERVICES [address] [place], [place], [address] Phone: [phone]   Fax:  [phone]   Name: CATHE BILGER MRN: [id number] Date of Birth: 12-24-1945

## 2020-07-14 ENCOUNTER — Ambulatory Visit: Payer: Medicare Other | Attending: Family Medicine | Admitting: Speech Pathology

## 2020-07-14 ENCOUNTER — Other Ambulatory Visit: Payer: Self-pay

## 2020-07-14 DIAGNOSIS — R41841 Cognitive communication deficit: Secondary | ICD-10-CM | POA: Diagnosis not present

## 2020-07-15 ENCOUNTER — Encounter: Payer: Self-pay | Admitting: Speech Pathology

## 2020-07-15 NOTE — Therapy (Signed)
[place] MAIN [facility] SERVICES [address] [place], [place], [address] Phone: [phone]   Fax:  [phone]  Speech Language Pathology Treatment Speech Therapy Progress Note   Dates of reporting period  06/09/2020   to   07/14/2020   Patient Details  Name: Anna Williams MRN: [id number] Date of Birth: 1945-12-28 Referring Provider (SLP): Miguel Aschoff   Encounter Date: 07/14/2020   End of Session - 07/15/20 1519    Visit Number 30    Number of Visits 3    Date for SLP Re-Evaluation 09/02/20    Authorization Type [employer]    Authorization Time Period 06/09/2020 thru 09/02/2020    Authorization - Visit Number 10    Progress Note Due on Visit 10    SLP Start Time 1000    SLP Stop Time  1100    SLP Time Calculation (min) 60 min    Activity Tolerance Patient tolerated treatment well           Past Medical History:  Diagnosis Date  . Anxiety and depression   . ASCUS with positive high risk HPV 09/2013  . Diabetes mellitus without complication ([place])    type 2  . Elevated blood sugar   . Family history of breast cancer in mother   . Fibroid uterus   . History of pulmonary embolus (PE)   . Hypertension   . Menopause   . Mild dysplasia of cervix 09/2013   repeat pap done -ecc neg, cerival bx cin 1  . Retinal detachment     Past Surgical History:  Procedure Laterality Date  . anterior neck fusion    . BREAST CYST ASPIRATION Left yrs ago  . CHOLECYSTECTOMY  2010  . DILATION AND CURETTAGE OF UTERUS    . HYSTEROSCOPY    . mole removed  2014    There were no vitals filed for this visit.   Subjective Assessment - 07/15/20 1517    Subjective Pt concerned with re-occurance of heartburn    Patient is accompained by: Family member    Currently in Pain? No/denies                 ADULT SLP TREATMENT - 07/15/20 0001      General Information   Behavior/Cognition Alert;Cooperative;Pleasant mood      HPI Anna Williams is a 74 y.o. female with medical history significant of anxiety disorder, diabetes, hypertension, hyperlipidemia, history of pulmonary embolism, with recent infarct in the left anterior basal ganglia involving lenticular nucleus and internal capsule (03/10/2020). Per chart, pt exhibited dysarthria which cleared during acute hospitalization. Of note pt fell striking back of head on 03/06/2020 with diagnosis of concussion and altered mental status.      Treatment Provided   Treatment provided Cognitive-Linquistic      Pain Assessment   Pain Assessment No/denies pain      Cognitive-Linquistic Treatment   Treatment focused on Cognition    Skilled Treatment Independent use of MyChart, independently sent MD a message; Independently completed Pinterest assignment and completed survey for making recipe; pt 90% accurate with dates entered into personal calendar; independently used calendar to locate specifically requested dates, days of month, maximal assistance for executive function task of combining words       Assessment / Recommendations / [place] with current plan of care      Progression Toward Goals   Progression toward goals Progressing toward goals  SLP Education - 07/15/20 1518    Education Details progress towards her current goals    Person(s) Educated Patient;Spouse    Methods Explanation;Demonstration;Verbal cues;Handout    Comprehension Verbal cues required            SLP Short Term Goals - 07/15/20 1520      SLP SHORT TERM GOAL #5   Title Given minimal cues, pt will solve semi-complex reasoning tasks with 90% accuracy.    Status Achieved      Additional Short Term Goals   Additional Short Term Goals Yes      SLP SHORT TERM GOAL #6   Title With moderate cues, pt will demonstrate selective attention to task for 5 minutes in a mildly distractive environment in 7 out of 10 opportunities.    Status Achieved      SLP SHORT  TERM GOAL #7   Title With moderate cues, pt will utilize memory strategy to recall basic daily information with 75% accuracy.    Status Achieved      SLP SHORT TERM GOAL #8   Title With minimal cues, pt will verbally organize basic information to increase listener understanding in 7 out of 10 opportunities.    Status Achieved      SLP SHORT TERM GOAL  #9   TITLE With supervision, pt will solve semi-complex reasoning tasks with 90% accuracy.    Baseline Minimal cues    Time 10    Period --   sessions   Status New      SLP SHORT TERM GOAL  #10   TITLE Pt will demonstrate alternating attention between 2 basic tasks with minimal cues.    Baseline new goal for alternating attention    Time 10    Period --   sessions   Status New            SLP Long Term Goals - 07/15/20 1526      SLP LONG TERM GOAL #1   Title Pt will identify cognitive-communication barriers and participate in developing compensatory strategies.    Status On-going      SLP LONG TERM GOAL #2   Title Pt will increase executive cognitive functions to complete complex reasoning tasks.    Status On-going      SLP LONG TERM GOAL #3   Title Pt will use memory strategies with minimal assistance to recall basic daily information.    Status Achieved            Plan - 07/15/20 1519    Clinical Impression Statement Skilled treatment session targeted pt's cognitive communication skills. Pt demonstrated a significant increase in independent ability to navigate and use MyChart, Pinterest and her personal calendar. SLP further facilitated session by providing maximal cues to problem solve executive function problem solving tasks. 10th NOTE PROGRESS REPORT Pt continues to be eager and attend all scheduled ST sessions. As a result, she continues to make progress towards her STG and LTGs. Specifically, pt has increased her ability to selectively attend to tasks in a moderately distracting environment for >30 minutes, she  verbally organizes information to promote listener understanding, and engaged in tasks to increase her memory and executive functions. Skilled ST is required to increase pt's functional cognitive communication independence and reduce caregiver burden.    Speech Therapy Frequency 2x / week    Duration 12 weeks    Treatment/Interventions Cognitive reorganization;Patient/family education;SLP instruction and feedback    Potential to Achieve Goals Good  Potential Considerations Ability to learn/carryover information    SLP Home Exercise Plan complete executive function homework    Consulted and Agree with Plan of Care Patient;Family member/caregiver    Family Member Consulted pt's husband           Patient will benefit from skilled therapeutic intervention in order to improve the following deficits and impairments:   Cognitive communication deficit    Problem List Patient Active Problem List   Diagnosis Date Noted  . CVA (cerebral vascular accident) ([place]) 03/10/2020  . Vaginal atrophy 04/29/2016  . Fibroid, uterine 04/29/2016  . Pelvic pain in female 04/29/2016  . Hypertension 10/16/2015  . Diabetes mellitus type 2 without retinopathy ([place]) 05/27/2015  . Menopause 04/29/2015  . Fibroids 04/29/2015  . Anxiety 04/29/2015  . Cervical dysplasia 04/29/2015  . History of pulmonary embolus (PE) 04/29/2015  . Family history of breast cancer in mother 04/29/2015  . Retinal detachment 04/29/2015   Phillp Dolores B. Rutherford Nail M.S., CCC-SLP, [place] Pathologist Rehabilitation Services Office [phone]  Stormy Fabian 07/15/2020, 3:26 PM  [place] MAIN [facility] SERVICES [address] [place], [place], [address] Phone: [phone]   Fax:  [phone]   Name: CERINITY ZYNDA MRN: [id number] Date of Birth: 1946-08-18

## 2020-07-16 ENCOUNTER — Encounter: Payer: Medicare Other | Admitting: Speech Pathology

## 2020-07-17 ENCOUNTER — Telehealth: Payer: Self-pay | Admitting: Family Medicine

## 2020-07-17 ENCOUNTER — Ambulatory Visit: Payer: Self-pay | Admitting: *Deleted

## 2020-07-17 ENCOUNTER — Ambulatory Visit: Payer: Medicare Other | Admitting: Speech Pathology

## 2020-07-17 ENCOUNTER — Other Ambulatory Visit: Payer: Self-pay

## 2020-07-17 DIAGNOSIS — R41841 Cognitive communication deficit: Secondary | ICD-10-CM

## 2020-07-17 NOTE — Telephone Encounter (Signed)
Call to patient-she is at speech therapy. Per therapist: not dizzy at all- per speech therapist- lethargy. See note sent in message. Wellbutrin has been discontinued- heart racing. Reduction 50 mg- did help the SE. She is off now and since stopping is lethargic. No loss of balance or vertigo reported. Patient felt uneasy driving to appointment due to lethargy.Vitals 141/72 84 In her opinion- she thinks the 50 mg bid Wellbutrin may be good dose for patient   Remeron is medication patient thinks was causing dizziness and hung over in am- lethargy is the complaint. Contact: [phone] Call anytime- Happi Overton  Please send recommendation on MyChart- for patient and care givers

## 2020-07-17 NOTE — Telephone Encounter (Signed)
Addressed in telephone encounter. Please review.

## 2020-07-17 NOTE — Telephone Encounter (Signed)
Patient returning call from office. Patient anxious and upset she has been very tired in the mornings. Patient reports that she has stopped taking her wellbutrin and no longer has symptoms with her heart rate. Offered to set up appt with PCP and patient declined. Patient advised if symptoms worsen to call back or go to ED. Patient reports she does not need to go to ED. Please advise .

## 2020-07-17 NOTE — Telephone Encounter (Signed)
See telephone encounter. KW

## 2020-07-17 NOTE — Telephone Encounter (Signed)
Contacted patient at number listed in telephone note and left a detailed message stating below, advised patient to contact our office to schedule a office visit for evaluation and treatment. KW Per Sharyn Lull [place]: Please triage patient, I do see where Dr. Rosanna Randy discontinued Wellbutrin due to increased heart rate - has this resolved?  If any emergent symptoms please send to ER, with history of CVA in May 2021.If non urgent can schedule appointment in office. Do not recommend patient driving until seen.  Recommend a follow up with neurology as well.

## 2020-07-17 NOTE — Telephone Encounter (Signed)
Pt calling back x 2. Requesting CB, after hours. Attempted to reach staff via teams, FC.  Pt had called earlier and spoke to another NT and declined appt.  Please CB on this number only: [phone]

## 2020-07-17 NOTE — Telephone Encounter (Signed)
Pt returning call.  Please see other messages/encounters, addressed in patient calls.   Pt denies dizziness, reports "Could not keep my eyes open past 2 days." States "Took my sleeping pill."   States has not been taking Wellbutrin, it is on current med profile.  States ST told her to take 1/2 tab during session which she did.  Pt and pts husband expressing frustration at multiple messages, calls. Difficult to follow discussion as pt is poor historian regarding meds.  NT called practice, [facility], as pt would like to speak with nurse at practice directly.  Please advise:  [phone]  Asked to please call only this number.

## 2020-07-17 NOTE — Telephone Encounter (Signed)
Lmtcb, okay for [facility] triage nurse to triage patient once she returns call to get more information about symptoms and for how long it has been going on, please see message below from Nurse Practitioner. KW

## 2020-07-17 NOTE — Telephone Encounter (Signed)
Please triage patient, I do see where Dr. Rosanna Randy discontinued Wellbutrin due to increased heart rate - has this resolved?  If any emergent symptoms please send to ER, with history of CVA in May 2021.If non urgent can schedule appointment in office. Do not recommend patient driving until seen.  Recommend a follow up with neurology as well.

## 2020-07-17 NOTE — Telephone Encounter (Signed)
Patient started Advair 1 week ago- she normally takes at night. Patient has noticed dizziness and off balance symptoms since started. Patient is requesting appointment today- contact: [phone]. Patient advised increase fliuds and do not take medication until she gets advise from PCP.  Reason for Disposition . Taking a medicine that could cause dizziness (e.g., blood pressure medications, diuretics)  Answer Assessment - Initial Assessment Questions 1. DESCRIPTION: "Describe your dizziness."     Dizziness and hangover due to medication 2. LIGHTHEADED: "Do you feel lightheaded?" (e.g., somewhat faint, woozy, weak upon standing)     yes 3. VERTIGO: "Do you feel like either you or the room is spinning or tilting?" (i.e. vertigo)     yes 4. SEVERITY: "How bad is it?"  "Do you feel like you are going to faint?" "Can you stand and walk?"   - MILD: Feels slightly dizzy, but walking normally.   - MODERATE: Feels very unsteady when walking, but not falling; interferes with normal activities (e.g., school, work) .   - SEVERE: Unable to walk without falling, or requires assistance to walk without falling; feels like passing out now.      Mild/moderate 5. ONSET:  "When did the dizziness begin?"     Building for few days- 1 week 6. AGGRAVATING FACTORS: "Does anything make it worse?" (e.g., standing, change in head position)     movement 7. HEART RATE: "Can you tell me your heart rate?" "How many beats in 15 seconds?"  (Note: not all patients can do this)       P - normal 8. CAUSE: "What do you think is causing the dizziness?"     Advair- new medication 9. RECURRENT SYMPTOM: "Have you had dizziness before?" If Yes, ask: "When was the last time?" "What happened that time?"     no 10. OTHER SYMPTOMS: "Do you have any other symptoms?" (e.g., fever, chest pain, vomiting, diarrhea, bleeding)       no 11. PREGNANCY: "Is there any chance you are pregnant?" "When was your last menstrual period?"        n/a  Protocols used: DIZZINESS [facility]

## 2020-07-18 ENCOUNTER — Encounter: Payer: Self-pay | Admitting: Speech Pathology

## 2020-07-18 NOTE — Telephone Encounter (Signed)
Returned call and spoke to pt's husband Herbie [place].

## 2020-07-18 NOTE — Therapy (Signed)
[facility] MAIN [facility] SERVICES [address] [place], [place], [address] Phone: [phone]   Fax:  [phone]  Speech Language Pathology Treatment  Patient Details  Name: Anna Williams MRN: [id number] Date of Birth: 1946/06/29 Referring Provider (SLP): Miguel Aschoff   Encounter Date: 07/17/2020   End of Session - 07/18/20 1558    Visit Number 31    Number of Visits 50    Date for SLP Re-Evaluation 09/02/20    Authorization Type [employer]    Authorization Time Period 06/09/2020 thru 09/02/2020    Authorization - Visit Number 1    Progress Note Due on Visit 10    SLP Start Time 1010    SLP Stop Time  1100    SLP Time Calculation (min) 50 min    Activity Tolerance Patient tolerated treatment well           Past Medical History:  Diagnosis Date  . Anxiety and depression   . ASCUS with positive high risk HPV 09/2013  . Diabetes mellitus without complication ([place])    type 2  . Elevated blood sugar   . Family history of breast cancer in mother   . Fibroid uterus   . History of pulmonary embolus (PE)   . Hypertension   . Menopause   . Mild dysplasia of cervix 09/2013   repeat pap done -ecc neg, cerival bx cin 1  . Retinal detachment     Past Surgical History:  Procedure Laterality Date  . anterior neck fusion    . BREAST CYST ASPIRATION Left yrs ago  . CHOLECYSTECTOMY  2010  . DILATION AND CURETTAGE OF UTERUS    . HYSTEROSCOPY    . mole removed  2014    There were no vitals filed for this visit.   Subjective Assessment - 07/18/20 1555    Subjective Pt concerned with "dizziness"    Patient is accompained by: Family member    Currently in Pain? No/denies                 ADULT SLP TREATMENT - 07/18/20 0001      General Information   Behavior/Cognition Alert;Confused;Distractible;Decreased sustained attention;Requires cueing    HPI Anna Williams is a 74 y.o. female with medical history  significant of anxiety disorder, diabetes, hypertension, hyperlipidemia, history of pulmonary embolism, with recent infarct in the left anterior basal ganglia involving lenticular nucleus and internal capsule (03/10/2020). Per chart, pt exhibited dysarthria which cleared during acute hospitalization. Of note pt fell striking back of head on 03/06/2020 with diagnosis of concussion and altered mental status.      Treatment Provided   Treatment provided Cognitive-Linquistic      Pain Assessment   Pain Assessment No/denies pain      Cognitive-Linquistic Treatment   Treatment focused on Cognition    Skilled Treatment Maximal multi-modal assistance provided to describe her current "dizziness"      Assessment / Recommendations / Heil with current plan of care      Progression Toward Goals   Progression toward goals Progressing toward goals            SLP Education - 07/18/20 1557    Education Details provided to pt's nurse who called during the session on pt's current symptoms    Person(s) Educated Patient;Spouse    Methods Explanation    Comprehension Need further instruction  SLP Short Term Goals - 07/15/20 1520      SLP SHORT TERM GOAL #5   Title Given minimal cues, pt will solve semi-complex reasoning tasks with 90% accuracy.    Status Achieved      Additional Short Term Goals   Additional Short Term Goals Yes      SLP SHORT TERM GOAL #6   Title With moderate cues, pt will demonstrate selective attention to task for 5 minutes in a mildly distractive environment in 7 out of 10 opportunities.    Status Achieved      SLP SHORT TERM GOAL #7   Title With moderate cues, pt will utilize memory strategy to recall basic daily information with 75% accuracy.    Status Achieved      SLP SHORT TERM GOAL #8   Title With minimal cues, pt will verbally organize basic information to increase listener understanding in 7 out of 10 opportunities.    Status Achieved       SLP SHORT TERM GOAL  #9   TITLE With supervision, pt will solve semi-complex reasoning tasks with 90% accuracy.    Baseline Minimal cues    Time 10    Period --   sessions   Status New      SLP SHORT TERM GOAL  #10   TITLE Pt will demonstrate alternating attention between 2 basic tasks with minimal cues.    Baseline new goal for alternating attention    Time 10    Period --   sessions   Status New            SLP Long Term Goals - 07/15/20 1526      SLP LONG TERM GOAL #1   Title Pt will identify cognitive-communication barriers and participate in developing compensatory strategies.    Status On-going      SLP LONG TERM GOAL #2   Title Pt will increase executive cognitive functions to complete complex reasoning tasks.    Status On-going      SLP LONG TERM GOAL #3   Title Pt will use memory strategies with minimal assistance to recall basic daily information.    Status Achieved            Plan - 07/18/20 1558    Clinical Impression Statement Pt late to session as she was talking to PCP's office in hallway about her current "dizziness" and it being a side-effect of her Advair. Pt presented very confused and provided poor history of current deficits. She required extensive redirection throughout session and required maximal multi-modal cues to verbally express her current symptoms. It appears that pt is experiencing a reoccurance of lethargy that may be related to current medicines. In pt's confused state, she didn't take any of her morning medicines including medicines for hypertension and diabetes. Education provided that essential medicines for blood pressure and diabetes should be taken consistently. [name] nurse called while pt was in session. It doesn't appear that pt is currently taking Advair as she had previously stated to me and PCP's office.  With pt and her husband's permission, this Probation officer shared information gathered during cognitive questions in the session.     Speech Therapy Frequency 2x / week    Duration 12 weeks    Treatment/Interventions Cognitive reorganization;Patient/family education;SLP instruction and feedback    Potential to Achieve Goals Good    Potential Considerations Ability to learn/carryover information;Severity of impairments    Consulted and Agree with Plan of Care Patient;Family member/caregiver  Family Member Consulted pt's husband           Patient will benefit from skilled therapeutic intervention in order to improve the following deficits and impairments:   Cognitive communication deficit    Problem List Patient Active Problem List   Diagnosis Date Noted  . CVA (cerebral vascular accident) ([place]) 03/10/2020  . Vaginal atrophy 04/29/2016  . Fibroid, uterine 04/29/2016  . Pelvic pain in female 04/29/2016  . Hypertension 10/16/2015  . Diabetes mellitus type 2 without retinopathy ([place]) 05/27/2015  . Menopause 04/29/2015  . Fibroids 04/29/2015  . Anxiety 04/29/2015  . Cervical dysplasia 04/29/2015  . History of pulmonary embolus (PE) 04/29/2015  . Family history of breast cancer in mother 04/29/2015  . Retinal detachment 04/29/2015   Dorman Calderwood B. Rutherford Nail M.S., CCC-SLP, [place] Pathologist Rehabilitation Services Office [phone]  Stormy Fabian 07/18/2020, 4:27 PM  [place] MAIN [facility] SERVICES [address] [place], [place], [address] Phone: [phone]   Fax:  [phone]   Name: Anna Williams MRN: [id number] Date of Birth: 1946/02/17

## 2020-07-18 NOTE — Telephone Encounter (Signed)
See other message in chart

## 2020-07-18 NOTE — Telephone Encounter (Signed)
Two encounters open  one from [facility] ok to close this encounter, Nunzio Cory CMA tried to call patient several times without an answer - April Sabra Heck CMA is going to attempt to reach patient today and triage. 07/18/20 8:19AM.

## 2020-07-18 NOTE — Telephone Encounter (Signed)
Called and spoke to patient's husband Herbie Baltimore. Per Herbie Baltimore patient is taking Remeron. The only symptoms she is having is fatigue. Per Herbie Baltimore, she is not having any tremors and not having bradycardia.  Patient is not currently taking Wellbutrin. Scheduled patient an appointment with Dr. Rosanna Randy for next week to discuss medications and fatigue.

## 2020-07-18 NOTE — Telephone Encounter (Signed)
Ok please have her decrease dose of Remeron to 15 mg which would be a half of a tablet she should have Remeron 30 mg tablet , take earlier in the evening or at night.  I can send in Remeron 15mg  tablet if they prefer.  Don't drive it lethargy.  Since she had tachycardia with the Wellbutrin she can stay off of that for now and discuss with Dr. Rosanna Randy next week.  Follow up sooner if needed.

## 2020-07-18 NOTE — Telephone Encounter (Signed)
Contacted pt's husband Herbie Baltimore.

## 2020-07-18 NOTE — Therapy (Signed)
[place] MAIN [facility] SERVICES [address] [place], [place], [address] Phone: [phone]   Fax:  [phone]  Patient Details  Name: Anna Williams MRN: [id number] Date of Birth: 1946/02/07 Referring Provider:  Jerrol Banana.,*  Encounter Date: 07/17/2020   Clarification: At no time did this Magazine features editor pt to take any dosage of Wellbutrin and pt didn't take Wellbutrin in front of this writer on 07/17/2020 during her ST session. This Probation officer also did not insert her own opinion to PCP's nurse as to whether Wellbutrin was appropriate for pt. This Probation officer provided pt reported information on symptoms when taking Wellbutrin 100 mg and 50 mg that was gathered during session in an effort to work thru pt's confused thought processing. See ST note.  Please feel free to contact this writer with any further questions.   Ova Meegan B. Rutherford Nail M.S., CCC-SLP, [place] Pathologist Rehabilitation Services Office [phone]   Stormy Fabian 07/18/2020, 4:49 PM  [place] MAIN [facility] SERVICES [address] [place], [place], [address] Phone: [phone]   Fax:  [phone]

## 2020-07-18 NOTE — Telephone Encounter (Signed)
Will you please call patient's husband at [phone] and triage patients symptoms and vitals ? Verify she has been taking Remeron 30mg  at night ?

## 2020-07-21 ENCOUNTER — Other Ambulatory Visit: Payer: Self-pay

## 2020-07-21 ENCOUNTER — Encounter: Payer: Self-pay | Admitting: Speech Pathology

## 2020-07-21 ENCOUNTER — Ambulatory Visit: Payer: Medicare Other | Admitting: Speech Pathology

## 2020-07-21 DIAGNOSIS — R41841 Cognitive communication deficit: Secondary | ICD-10-CM | POA: Diagnosis not present

## 2020-07-21 NOTE — Therapy (Signed)
[place] MAIN [facility] SERVICES [address] [place], [place], [address] Phone: [phone]   Fax:  [phone]  Speech Language Pathology Treatment  Patient Details  Name: Anna Williams MRN: [id number] Date of Birth: 1946-01-20 Referring Provider (SLP): Miguel Aschoff   Encounter Date: 07/21/2020   End of Session - 07/21/20 1547    Visit Number 32    Number of Visits 44    Date for SLP Re-Evaluation 09/02/20    Authorization Type [employer] Medicare    Authorization Time Period 06/09/2020 thru 09/02/2020    Authorization - Visit Number 2    Progress Note Due on Visit 10    SLP Start Time 1000    SLP Stop Time  1115    SLP Time Calculation (min) 75 min    Activity Tolerance Patient tolerated treatment well           Past Medical History:  Diagnosis Date  . Anxiety and depression   . ASCUS with positive high risk HPV 09/2013  . Diabetes mellitus without complication ([place])    type 2  . Elevated blood sugar   . Family history of breast cancer in mother   . Fibroid uterus   . History of pulmonary embolus (PE)   . Hypertension   . Menopause   . Mild dysplasia of cervix 09/2013   repeat pap done -ecc neg, cerival bx cin 1  . Retinal detachment     Past Surgical History:  Procedure Laterality Date  . anterior neck fusion    . BREAST CYST ASPIRATION Left yrs ago  . CHOLECYSTECTOMY  2010  . DILATION AND CURETTAGE OF UTERUS    . HYSTEROSCOPY    . mole removed  2014    There were no vitals filed for this visit.   Subjective Assessment - 07/21/20 1546    Subjective pt concerned with continued lethargy    Patient is accompained by: Family member    Currently in Pain? No/denies                 ADULT SLP TREATMENT - 07/21/20 0001      General Information   Behavior/Cognition Alert;Confused;Distractible;Decreased sustained attention;Requires cueing    HPI Anna Williams is a 74 y.o. female with medical  history significant of anxiety disorder, diabetes, hypertension, hyperlipidemia, history of pulmonary embolism, with recent infarct in the left anterior basal ganglia involving lenticular nucleus and internal capsule (03/10/2020). Per chart, pt exhibited dysarthria which cleared during acute hospitalization. Of note pt fell striking back of head on 03/06/2020 with diagnosis of concussion and altered mental status.      Treatment Provided   Treatment provided Cognitive-Linquistic      Pain Assessment   Pain Assessment No/denies pain      Cognitive-Linquistic Treatment   Treatment focused on Cognition    Skilled Treatment Cognitive Linguistic Quick Test administered to assess progress - see impressions statement       Assessment / Recommendations / Plan   Plan Continue with current plan of care      Progression Toward Goals   Progression toward goals Progressing toward goals            SLP Education - 07/21/20 1546    Education Details overall progress    Person(s) Educated Patient;Spouse    Methods Explanation;Demonstration    Comprehension Verbalized understanding            SLP Short Term Goals - 07/15/20 1520  SLP SHORT TERM GOAL #5   Title Given minimal cues, pt will solve semi-complex reasoning tasks with 90% accuracy.    Status Achieved      Additional Short Term Goals   Additional Short Term Goals Yes      SLP SHORT TERM GOAL #6   Title With moderate cues, pt will demonstrate selective attention to task for 5 minutes in a mildly distractive environment in 7 out of 10 opportunities.    Status Achieved      SLP SHORT TERM GOAL #7   Title With moderate cues, pt will utilize memory strategy to recall basic daily information with 75% accuracy.    Status Achieved      SLP SHORT TERM GOAL #8   Title With minimal cues, pt will verbally organize basic information to increase listener understanding in 7 out of 10 opportunities.    Status Achieved      SLP SHORT TERM  GOAL  #9   TITLE With supervision, pt will solve semi-complex reasoning tasks with 90% accuracy.    Baseline Minimal cues    Time 10    Period --   sessions   Status New      SLP SHORT TERM GOAL  #10   TITLE Pt will demonstrate alternating attention between 2 basic tasks with minimal cues.    Baseline new goal for alternating attention    Time 10    Period --   sessions   Status New            SLP Long Term Goals - 07/15/20 1526      SLP LONG TERM GOAL #1   Title Pt will identify cognitive-communication barriers and participate in developing compensatory strategies.    Status On-going      SLP LONG TERM GOAL #2   Title Pt will increase executive cognitive functions to complete complex reasoning tasks.    Status On-going      SLP LONG TERM GOAL #3   Title Pt will use memory strategies with minimal assistance to recall basic daily information.    Status Achieved            Plan - 07/21/20 1547    Clinical Impression Statement The Cognitive Linguistic Quick Test was administered to document pt's cognitive progress. Today's scores were compared to initial scores obtained on March 20, 2020.   Task Scores   March 20, 2020    July 21, 2020   Attention - 124                 156 - increased by 12   Memory - 132   145 - increased by 13   Executive function - 11                13 - increased by 2   Language - 29   29    Visuospatial - 41  63 - increased by 22   Clock drawing - 8  11 - increased by 3   Severity Ratings   March 20, 2020   July 21, 2020   Attention - Mild                 Within Normal Limits (WNL) - improved   Memory - Mild                 Mild   Executive Function - MOD Mild - improved   Language - WNL  WNL   Visuospatial -  Mild  WNL - improved   Clock drawing - Moderate WNL   Scores reflect progress observed within skilled ST sessions. Results shared with pt and her husband. Pt largely apathic. During next session, pt and her husband will  collaborate on potential cognitive goals.        Speech Therapy Frequency 2x / week    Duration 12 weeks    Treatment/Interventions Cognitive reorganization;Patient/family education;SLP instruction and feedback    Potential to Achieve Goals Good    Potential Considerations Ability to learn/carryover information;Severity of impairments    SLP Home Exercise Plan complete executive function homework    Consulted and Agree with Plan of Care Patient;Family member/caregiver    Family Member Consulted pt's husband           Patient will benefit from skilled therapeutic intervention in order to improve the following deficits and impairments:   Cognitive communication deficit    Problem List Patient Active Problem List   Diagnosis Date Noted  . CVA (cerebral vascular accident) ([place]) 03/10/2020  . Vaginal atrophy 04/29/2016  . Fibroid, uterine 04/29/2016  . Pelvic pain in female 04/29/2016  . Hypertension 10/16/2015  . Diabetes mellitus type 2 without retinopathy ([place]) 05/27/2015  . Menopause 04/29/2015  . Fibroids 04/29/2015  . Anxiety 04/29/2015  . Cervical dysplasia 04/29/2015  . History of pulmonary embolus (PE) 04/29/2015  . Family history of breast cancer in mother 04/29/2015  . Retinal detachment 04/29/2015   Ramondo Dietze B. Rutherford Nail M.S., CCC-SLP, [place] Pathologist Rehabilitation Services Office [phone]  Stormy Fabian 07/21/2020, 3:48 PM  [place] MAIN [facility] SERVICES [address] [place], [place], [address] Phone: [phone]   Fax:  [phone]   Name: Anna Williams MRN: [id number] Date of Birth: 1945-10-30

## 2020-07-22 ENCOUNTER — Other Ambulatory Visit: Payer: Self-pay | Admitting: Family Medicine

## 2020-07-22 DIAGNOSIS — I639 Cerebral infarction, unspecified: Secondary | ICD-10-CM

## 2020-07-22 NOTE — Telephone Encounter (Signed)
Requested medication (s) are due for refill today: No  Requested medication (s) are on the active medication list: Yes  Last refill:  06/30/20  Future visit scheduled: Yes  Notes to clinic:  Pharmacy requests 90 day supply and diagnosis code.    Requested Prescriptions  Pending Prescriptions Disp Refills   mirtazapine (REMERON) 30 MG tablet [Pharmacy Med Name: MIRTAZAPINE 30 MG TABLET] 90 tablet 2    Sig: TAKE 1 TABLET BY MOUTH AT BEDTIME.      Psychiatry: Antidepressants - mirtazapine Passed - 07/22/2020  2:32 PM      Passed - AST in normal range and within 360 days    AST  Date Value Ref Range Status  03/11/2020 23 15 - 41 U/L Final          Passed - ALT in normal range and within 360 days    ALT  Date Value Ref Range Status  03/11/2020 26 0 - 44 U/L Final          Passed - Triglycerides in normal range and within 360 days    Triglycerides  Date Value Ref Range Status  03/11/2020 112 <150 mg/dL Final          Passed - Total Cholesterol in normal range and within 360 days    Cholesterol, Total  Date Value Ref Range Status  12/20/2017 170 100 - 199 mg/dL Final   Cholesterol  Date Value Ref Range Status  03/11/2020 125 0 - 200 mg/dL Final          Passed - WBC in normal range and within 360 days    WBC  Date Value Ref Range Status  06/09/2020 4.5 3.4 - 10.8 x10E3/uL Final  03/11/2020 5.2 4.0 - 10.5 K/uL Final          Passed - Valid encounter within last 6 months    Recent Outpatient Visits           3 weeks ago Diabetes mellitus type 2 without retinopathy [facility])   [facility] Jerrol Banana., MD   1 month ago Diabetes mellitus type 2 without retinopathy [facility])   [facility] Jerrol Banana., MD   2 months ago Altered mental status, unspecified altered mental status type   [facility] Jerrol Banana., MD   3 months ago CVA, old, cognitive deficits   [facility] Jerrol Banana., MD   4 months ago Cerebrovascular accident (CVA), unspecified mechanism [facility])   [facility] Jerrol Banana., MD       Future Appointments             Tomorrow Jerrol Banana., MD [facility], [place]   In 1 week Jerrol Banana., MD [facility], [place]

## 2020-07-22 NOTE — Telephone Encounter (Signed)
LMOVM for pt to return call 

## 2020-07-23 ENCOUNTER — Other Ambulatory Visit: Payer: Self-pay

## 2020-07-23 ENCOUNTER — Encounter: Payer: Self-pay | Admitting: Family Medicine

## 2020-07-23 ENCOUNTER — Ambulatory Visit (INDEPENDENT_AMBULATORY_CARE_PROVIDER_SITE_OTHER): Payer: Medicare Other | Admitting: Family Medicine

## 2020-07-23 ENCOUNTER — Ambulatory Visit: Payer: Medicare Other | Admitting: Speech Pathology

## 2020-07-23 VITALS — BP 130/73 | HR 80 | Temp 98.5°F | Resp 16 | Ht 67.0 in | Wt 148.0 lb

## 2020-07-23 DIAGNOSIS — S060X0S Concussion without loss of consciousness, sequela: Secondary | ICD-10-CM

## 2020-07-23 DIAGNOSIS — I639 Cerebral infarction, unspecified: Secondary | ICD-10-CM | POA: Diagnosis not present

## 2020-07-23 DIAGNOSIS — F419 Anxiety disorder, unspecified: Secondary | ICD-10-CM | POA: Diagnosis not present

## 2020-07-23 DIAGNOSIS — K219 Gastro-esophageal reflux disease without esophagitis: Secondary | ICD-10-CM | POA: Diagnosis not present

## 2020-07-23 DIAGNOSIS — E119 Type 2 diabetes mellitus without complications: Secondary | ICD-10-CM | POA: Diagnosis not present

## 2020-07-23 MED ORDER — SERTRALINE HCL 50 MG PO TABS: 50.0000 mg | ORAL_TABLET | Freq: Every day | ORAL | 1 refills | Status: DC

## 2020-07-23 MED ORDER — OMEPRAZOLE 20 MG PO CPDR: 20.0000 mg | DELAYED_RELEASE_CAPSULE | Freq: Two times a day (BID) | ORAL | 1 refills | Status: DC

## 2020-07-23 NOTE — Telephone Encounter (Signed)
Patient has ov today. 

## 2020-07-23 NOTE — Patient Instructions (Signed)
Stop citalopram.

## 2020-07-23 NOTE — Progress Notes (Signed)
I,[name],acting as a scribe for Wilhemena Durie, MD.,have documented all relevant documentation on the behalf of Wilhemena Durie, MD,as directed by  Wilhemena Durie, MD while in the presence of Wilhemena Durie, MD.    Established patient visit   Patient: Anna Williams   DOB: Sep 10, 1946   74 y.o. Female  MRN: [id number] Visit Date: 07/23/2020  Today's healthcare provider: Wilhemena Durie, MD   Chief Complaint  Patient presents with  . Follow-up  . Hypertension   Subjective    HPI  Patient states she feels better and husband definitely think she is feeling better than she was. She had more energy but could not tolerate Wellbutrin.  Mirtazapine is definitely helped her sleep better and be less anxious.  Speech appears to be back to normal. Hypertension, follow-up  BP Readings from Last 3 Encounters:  07/23/20 130/73  06/09/20 132/75  05/08/20 (!) 134/75   Wt Readings from Last 3 Encounters:  07/23/20 148 lb (67.1 kg)  06/09/20 154 lb 6.4 oz (70 kg)  05/08/20 153 lb (69.4 kg)     She was last seen for hypertension 3 weeks ago.  BP at that visit was 132/75. Management since that visit includes; Added back amlodipine at 5 mg daily. She reports good compliance with treatment. She is not having side effects. none She is not exercising. She is adherent to low salt diet.   Outside blood pressures are; says she good.  She does not smoke.  Use of agents associated with hypertension: none.   --------------------------------------------------------------------  Cerebrovascular accident (CVA), unspecified mechanism ([place]) From 06/30/2020-Still seeing speech therapy  Concussion without loss of consciousness, subsequent encounter From 06/30/2020-I think this is continuing to affect the patient some.  Refer back to neurology for their input.  Anxiety From 9/20/20121-Added mirtazapine 30 mg nightly.  Reactive depression From 06/30/2020-Consider  referral to psychiatry.  I am also concerned about weight loss from 154 pounds a month ago to 142 pounds this month.  Hopefully mirtazapine will help her appetite.  No sign of systemic illness.      Medications: Outpatient Medications Prior to Visit  Medication Sig  . amLODipine (NORVASC) 5 MG tablet Take 1 tablet (5 mg total) by mouth daily.  Marland Kitchen Apoaequorin (PREVAGEN) 10 MG CAPS Take 10 mg by mouth daily at 12 noon.   Marland Kitchen atorvastatin (LIPITOR) 40 MG tablet TAKE 1 TABLET BY MOUTH EVERY DAY  . Calcium Citrate-Vitamin D (CALCIUM + D PO) Take 1 tablet by mouth daily.  . citalopram (CELEXA) 20 MG tablet TAKE 1/2 TABLET BY MOUTH EVERY DAY  . glucose blood (ONE TOUCH ULTRA TEST) test strip CHECK SUGAR ONCE DAILY  . lidocaine (LIDODERM) 5 % APPLY PATCH TO AFFECTED AREA WHEN AWAKE.  . metFORMIN (GLUCOPHAGE-XR) 500 MG 24 hr tablet TAKE 1 TABLET BY MOUTH EVERY DAY WITH BREAKFAST  . mirtazapine (REMERON) 30 MG tablet Take 1 tablet (30 mg total) by mouth at bedtime.  . Multiple Vitamins-Minerals (PRESERVISION/LUTEIN PO) Take by mouth daily.   Marland Kitchen omeprazole (PRILOSEC) 20 MG capsule Take 1 capsule (20 mg total) by mouth daily.  Glory Rosebush DELICA LANCETS FINE MISC Check sugar once daily  . pioglitazone (ACTOS) 30 MG tablet Take 0.5 tablets (15 mg total) by mouth daily.  Marland Kitchen buPROPion (WELLBUTRIN SR) 100 MG 12 hr tablet TAKE 1 TABLET BY MOUTH TWICE A DAY (Patient not taking: Reported on 07/23/2020)  . JARDIANCE 25 MG TABS tablet TAKE 1 TABLET BY MOUTH  DAILY (Patient not taking: Reported on 06/09/2020)   No facility-administered medications prior to visit.    Review of Systems  Constitutional: Negative for appetite change, chills, fatigue and fever.  Respiratory: Negative for chest tightness and shortness of breath.   Cardiovascular: Negative for chest pain and palpitations.  Gastrointestinal: Negative for abdominal pain, nausea and vomiting.  Neurological: Negative for dizziness and weakness.    Last  hemoglobin A1c Lab Results  Component Value Date   HGBA1C 8.2 (A) 06/30/2020      Objective    BP 130/73 (BP Location: Right Arm, Patient Position: Sitting, Cuff Size: Normal)   Pulse 80   Temp 98.5 F (36.9 C) (Oral)   Resp 16   Ht 5\' 7"  (1.702 m)   Wt 148 lb (67.1 kg)   SpO2 100%   BMI 23.18 kg/m  BP Readings from Last 3 Encounters:  07/23/20 130/73  06/09/20 132/75  05/08/20 (!) 134/75   Wt Readings from Last 3 Encounters:  07/23/20 148 lb (67.1 kg)  06/09/20 154 lb 6.4 oz (70 kg)  05/08/20 153 lb (69.4 kg)      Physical Exam Vitals reviewed.  Constitutional:      Appearance: Normal appearance. She is normal weight.  Eyes:     General: No scleral icterus.    Conjunctiva/sclera: Conjunctivae normal.  Cardiovascular:     Rate and Rhythm: Normal rate and regular rhythm.     Pulses: Normal pulses.     Heart sounds: Normal heart sounds.  Pulmonary:     Effort: Pulmonary effort is normal.     Breath sounds: Normal breath sounds.  Abdominal:     General: Bowel sounds are normal.     Palpations: Abdomen is soft.  Musculoskeletal:     Cervical back: Normal range of motion and neck supple.     Right lower leg: No edema.     Left lower leg: No edema.  Skin:    General: Skin is warm and dry.  Neurological:     General: No focal deficit present.     Mental Status: She is alert and oriented to person, place, and time.  Psychiatric:        Mood and Affect: Mood normal.        Behavior: Behavior normal.        Thought Content: Thought content normal.        Judgment: Judgment normal.     Comments:  patient is anxious today.       No results found for any visits on 07/23/20.  Assessment & Plan     1. Anxiety Worse on Wellbutrin.  Change citalopram to sertraline 50 mg daily.  Follow-up in 1 month. - sertraline (ZOLOFT) 50 MG tablet; Take 1 tablet (50 mg total) by mouth daily.  Dispense: 30 tablet; Refill: 1  2. Gastroesophageal reflux disease Increase  omeprazole to twice daily for symptom relief - omeprazole (PRILOSEC) 20 MG capsule; Take 1 capsule (20 mg total) by mouth 2 (two) times daily before a meal.  Dispense: 180 capsule; Refill: 1  3. Cerebrovascular accident (CVA), unspecified mechanism ([place]) Follow-up with Dr. Melrose Nakayama from neurology in the future  4. Concussion without loss of consciousness, sequela ([place]) Memory problems started with a concussion before her stroke.  This seems to be improving. On Lipitor. 5. Diabetes mellitus type 2 without retinopathy ([place]) Follow-up A1c when appropriate.   No follow-ups on file.         Wilhemena Durie, MD  [place] [phone] (phone) [phone] (fax)  [place]

## 2020-07-25 ENCOUNTER — Ambulatory Visit
Admission: RE | Admit: 2020-07-25 | Discharge: 2020-07-25 | Disposition: A | Discharge status: Home / Self Care | Payer: Medicare Other | Source: Ambulatory Visit | Attending: Obstetrics and Gynecology | Admitting: Obstetrics and Gynecology

## 2020-07-25 ENCOUNTER — Other Ambulatory Visit: Payer: Self-pay

## 2020-07-25 DIAGNOSIS — Z1231 Encounter for screening mammogram for malignant neoplasm of breast: Secondary | ICD-10-CM | POA: Diagnosis not present

## 2020-07-29 ENCOUNTER — Other Ambulatory Visit: Payer: Self-pay

## 2020-07-29 ENCOUNTER — Ambulatory Visit: Payer: Medicare Other | Admitting: Speech Pathology

## 2020-07-29 DIAGNOSIS — R41841 Cognitive communication deficit: Secondary | ICD-10-CM

## 2020-07-30 ENCOUNTER — Encounter: Payer: Self-pay | Admitting: Speech Pathology

## 2020-07-30 ENCOUNTER — Other Ambulatory Visit: Payer: Self-pay | Admitting: Family Medicine

## 2020-07-30 NOTE — Therapy (Signed)
[place] MAIN [facility] SERVICES [address] [place], [place], [address] Phone: [phone]   Fax:  [phone]  Speech Language Pathology Treatment  Patient Details  Name: Anna Williams MRN: [id number] Date of Birth: [birth year] Referring Provider (SLP): Miguel Aschoff   Encounter Date: 07/29/2020   End of Session - 07/30/20 0926    Visit Number 33    Number of Visits 66    Date for SLP Re-Evaluation 09/02/20    Authorization Type [employer]    Authorization Time Period 06/09/2020 thru 09/02/2020    Authorization - Visit Number 3    Progress Note Due on Visit 10    SLP Start Time 1005    SLP Stop Time  1105    SLP Time Calculation (min) 60 min    Activity Tolerance Patient tolerated treatment well           Past Medical History:  Diagnosis Date  . Anxiety and depression   . ASCUS with positive high risk HPV 09/2013  . Diabetes mellitus without complication ([place])    type 2  . Elevated blood sugar   . Family history of breast cancer in mother   . Fibroid uterus   . History of pulmonary embolus (PE)   . Hypertension   . Menopause   . Mild dysplasia of cervix 09/2013   repeat pap done -ecc neg, cerival bx cin 1  . Retinal detachment     Past Surgical History:  Procedure Laterality Date  . anterior neck fusion    . BREAST CYST ASPIRATION Left yrs ago  . CHOLECYSTECTOMY  2010  . DILATION AND CURETTAGE OF UTERUS    . HYSTEROSCOPY    . mole removed  2014    There were no vitals filed for this visit.   Subjective Assessment - 07/30/20 0920    Subjective pt appeared in better spirits    Currently in Pain? No/denies                 ADULT SLP TREATMENT - 07/30/20 0001      General Information   Behavior/Cognition Alert;Cooperative;Pleasant mood;Distractible    HPI Anna Williams is a 74 y.o. female with medical history significant of anxiety disorder, diabetes, hypertension, hyperlipidemia,  history of pulmonary embolism, with recent infarct in the left anterior basal ganglia involving lenticular nucleus and internal capsule (03/10/2020). Per chart, pt exhibited dysarthria which cleared during acute hospitalization. Of note pt fell striking back of head on 03/06/2020 with diagnosis of concussion and altered mental status.      Treatment Provided   Treatment provided Cognitive-Linquistic      Pain Assessment   Pain Assessment No/denies pain      Cognitive-Linquistic Treatment   Treatment focused on Cognition    Skilled Treatment Reviewed external memory strategies and introduced internal memory strategies; Supervision cues required to produce associations for common basic items; Minimal cues to describe visualization of item      Assessment / Recommendations / [place] with current plan of care      Progression Toward Goals   Progression toward goals Progressing toward goals            SLP Education - 07/30/20 0926    Education Details external and internal memory strategies    Person(s) Educated Patient    Methods Explanation;Demonstration;Handout;Verbal cues    Comprehension Need further instruction  SLP Short Term Goals - 07/15/20 1520      SLP SHORT TERM GOAL #5   Title Given minimal cues, pt will solve semi-complex reasoning tasks with 90% accuracy.    Status Achieved      Additional Short Term Goals   Additional Short Term Goals Yes      SLP SHORT TERM GOAL #6   Title With moderate cues, pt will demonstrate selective attention to task for 5 minutes in a mildly distractive environment in 7 out of 10 opportunities.    Status Achieved      SLP SHORT TERM GOAL #7   Title With moderate cues, pt will utilize memory strategy to recall basic daily information with 75% accuracy.    Status Achieved      SLP SHORT TERM GOAL #8   Title With minimal cues, pt will verbally organize basic information to increase listener understanding in 7 out  of 10 opportunities.    Status Achieved      SLP SHORT TERM GOAL  #9   TITLE With supervision, pt will solve semi-complex reasoning tasks with 90% accuracy.    Baseline Minimal cues    Time 10    Period --   sessions   Status New      SLP SHORT TERM GOAL  #10   TITLE Pt will demonstrate alternating attention between 2 basic tasks with minimal cues.    Baseline new goal for alternating attention    Time 10    Period --   sessions   Status New            SLP Long Term Goals - 07/15/20 1526      SLP LONG TERM GOAL #1   Title Pt will identify cognitive-communication barriers and participate in developing compensatory strategies.    Status On-going      SLP LONG TERM GOAL #2   Title Pt will increase executive cognitive functions to complete complex reasoning tasks.    Status On-going      SLP LONG TERM GOAL #3   Title Pt will use memory strategies with minimal assistance to recall basic daily information.    Status Achieved            Plan - 07/30/20 0927    Clinical Impression Statement Skilled treatment session focused specifically on pt's memory goals. SLP facilitated session by reviewing external memory strategies (with handout given). Pt has been utilizing external memory strategies with good success. Internal memory strategies were introduced. Pt reports that she has independently started using visualization to aid in recall. Association introduced and supervision provided to associate common items.    Speech Therapy Frequency 2x / week    Duration 12 weeks    Treatment/Interventions Cognitive reorganization;Patient/family education;SLP instruction and feedback    Potential to Achieve Goals Good    SLP Home Exercise Plan write down associations for word list    Consulted and Agree with Plan of Care Patient           Patient will benefit from skilled therapeutic intervention in order to improve the following deficits and impairments:   Cognitive communication  deficit    Problem List Patient Active Problem List   Diagnosis Date Noted  . CVA (cerebral vascular accident) ([place]) 03/10/2020  . Vaginal atrophy 04/29/2016  . Fibroid, uterine 04/29/2016  . Pelvic pain in female 04/29/2016  . Hypertension 10/16/2015  . Diabetes mellitus type 2 without retinopathy ([place]) 05/27/2015  . Menopause 04/29/2015  .  Fibroids 04/29/2015  . Anxiety 04/29/2015  . Cervical dysplasia 04/29/2015  . History of pulmonary embolus (PE) 04/29/2015  . Family history of breast cancer in mother 04/29/2015  . Retinal detachment 04/29/2015   Jayleen Scaglione B. Rutherford Nail M.S., CCC-SLP, [place] Pathologist Rehabilitation Services Office [phone]  Stormy Fabian 07/30/2020, 9:28 AM  [place] MAIN [facility] SERVICES [address] [place], [place], [address] Phone: [phone]   Fax:  [phone]   Name: Anna Williams MRN: [id number] Date of Birth: January 14, 1946

## 2020-07-30 NOTE — Telephone Encounter (Signed)
Requested Prescriptions  Pending Prescriptions Disp Refills   atorvastatin (LIPITOR) 40 MG tablet [Pharmacy Med Name: ATORVASTATIN 40 MG TABLET] 90 tablet 2    Sig: TAKE 1 TABLET BY MOUTH EVERY DAY     Cardiovascular:  Antilipid - Statins Passed - 07/30/2020  1:07 AM      Passed - Total Cholesterol in normal range and within 360 days    Cholesterol, Total  Date Value Ref Range Status  12/20/2017 170 100 - 199 mg/dL Final   Cholesterol  Date Value Ref Range Status  03/11/2020 125 0 - 200 mg/dL Final         Passed - LDL in normal range and within 360 days    LDL Calculated  Date Value Ref Range Status  12/20/2017 91 0 - 99 mg/dL Final   LDL Cholesterol  Date Value Ref Range Status  03/11/2020 61 0 - 99 mg/dL Final    Comment:           Total Cholesterol/HDL:CHD Risk Coronary Heart Disease Risk Table                     Men   Women  1/2 Average Risk   3.4   3.3  Average Risk       5.0   4.4  2 X Average Risk   9.6   7.1  3 X Average Risk  23.4   11.0        Use the calculated Patient Ratio above and the CHD Risk Table to determine the patient's CHD Risk.        ATP III CLASSIFICATION (LDL):  <100     mg/dL   Optimal  100-129  mg/dL   Near or Above                    Optimal  130-159  mg/dL   Borderline  160-189  mg/dL   High  >190     mg/dL   Very High Performed at [facility], [place], [place] [address]          Passed - HDL in normal range and within 360 days    HDL  Date Value Ref Range Status  03/11/2020 42 >40 mg/dL Final  12/20/2017 51 >39 mg/dL Final         Passed - Triglycerides in normal range and within 360 days    Triglycerides  Date Value Ref Range Status  03/11/2020 112 <150 mg/dL Final         Passed - Patient is not pregnant      Passed - Valid encounter within last 12 months    Recent Outpatient Visits          1 week ago [place] Jerrol Banana., MD   1 month ago  Diabetes mellitus type 2 without retinopathy [facility])   [facility] Jerrol Banana., MD   1 month ago Diabetes mellitus type 2 without retinopathy [facility])   [facility] Jerrol Banana., MD   2 months ago Altered mental status, unspecified altered mental status type   [facility] Jerrol Banana., MD   4 months ago CVA, old, cognitive deficits   [facility] Jerrol Banana., MD      Future Appointments            Tomorrow Jerrol Banana., MD  [facility], [place]   In 4 weeks Rosanna Randy, Retia Passe., MD [facility], [place]

## 2020-07-31 ENCOUNTER — Other Ambulatory Visit: Payer: Self-pay

## 2020-07-31 ENCOUNTER — Ambulatory Visit: Payer: Medicare Other | Admitting: Speech Pathology

## 2020-07-31 ENCOUNTER — Ambulatory Visit: Payer: Self-pay | Admitting: Family Medicine

## 2020-07-31 DIAGNOSIS — R41841 Cognitive communication deficit: Secondary | ICD-10-CM

## 2020-07-31 NOTE — Progress Notes (Deleted)
     Established patient visit   Patient: Anna Williams   DOB: [birth year]   74 y.o. Female  MRN: [id number] Visit Date: 07/31/2020  Today's healthcare provider: Wilhemena Durie, MD   No chief complaint on file.  Subjective    HPI  ***  {Show patient history (optional):23778::" "}   Medications: Outpatient Medications Prior to Visit  Medication Sig  . amLODipine (NORVASC) 5 MG tablet Take 1 tablet (5 mg total) by mouth daily.  Marland Kitchen Apoaequorin (PREVAGEN) 10 MG CAPS Take 10 mg by mouth daily at 12 noon.   Marland Kitchen atorvastatin (LIPITOR) 40 MG tablet TAKE 1 TABLET BY MOUTH EVERY DAY  . buPROPion (WELLBUTRIN SR) 100 MG 12 hr tablet TAKE 1 TABLET BY MOUTH TWICE A DAY (Patient not taking: Reported on 07/23/2020)  . Calcium Citrate-Vitamin D (CALCIUM + D PO) Take 1 tablet by mouth daily.  Marland Kitchen glucose blood (ONE TOUCH ULTRA TEST) test strip CHECK SUGAR ONCE DAILY  . JARDIANCE 25 MG TABS tablet TAKE 1 TABLET BY MOUTH DAILY (Patient not taking: Reported on 06/09/2020)  . lidocaine (LIDODERM) 5 % APPLY PATCH TO AFFECTED AREA WHEN AWAKE.  . metFORMIN (GLUCOPHAGE-XR) 500 MG 24 hr tablet TAKE 1 TABLET BY MOUTH EVERY DAY WITH BREAKFAST  . mirtazapine (REMERON) 30 MG tablet TAKE 1 TABLET BY MOUTH AT BEDTIME.  . Multiple Vitamins-Minerals (PRESERVISION/LUTEIN PO) Take by mouth daily.   Marland Kitchen omeprazole (PRILOSEC) 20 MG capsule Take 1 capsule (20 mg total) by mouth 2 (two) times daily before a meal.  . ONETOUCH DELICA LANCETS FINE MISC Check sugar once daily  . pioglitazone (ACTOS) 30 MG tablet Take 0.5 tablets (15 mg total) by mouth daily.  . sertraline (ZOLOFT) 50 MG tablet Take 1 tablet (50 mg total) by mouth daily.   No facility-administered medications prior to visit.    Review of Systems  Constitutional: Negative for appetite change, chills, fatigue and fever.  Respiratory: Negative for chest tightness and shortness of breath.   Cardiovascular: Negative for chest pain and palpitations.    Gastrointestinal: Negative for abdominal pain, nausea and vomiting.  Neurological: Negative for dizziness and weakness.    {Heme  Chem  Endocrine  Serology  Results Review (optional):23779::" "}  Objective    There were no vitals taken for this visit. {Show previous vital signs (optional):23777::" "}  Physical Exam  ***  No results found for any visits on 07/31/20.  Assessment & Plan     ***  No follow-ups on file.      {provider attestation***:1}   Wilhemena Durie, MD  [facility] [phone] (phone) [phone] (fax)  [place]

## 2020-08-01 ENCOUNTER — Encounter: Payer: Self-pay | Admitting: Speech Pathology

## 2020-08-01 NOTE — Therapy (Addendum)
[place] MAIN [facility] SERVICES [address] [place], [place], [address] Phone: [phone]   Fax:  [phone]  Speech Language Pathology Treatment  Patient Details  Name: Anna Williams MRN: [id number] Date of Birth: 12/19/1945 Referring Provider (SLP): Miguel Aschoff   Encounter Date: 07/31/2020    Past Medical History:  Diagnosis Date  . Anxiety and depression   . ASCUS with positive high risk HPV 09/2013  . Diabetes mellitus without complication ([place])    type 2  . Elevated blood sugar   . Family history of breast cancer in mother   . Fibroid uterus   . History of pulmonary embolus (PE)   . Hypertension   . Menopause   . Mild dysplasia of cervix 09/2013   repeat pap done -ecc neg, cerival bx cin 1  . Retinal detachment     Past Surgical History:  Procedure Laterality Date  . anterior neck fusion    . BREAST CYST ASPIRATION Left yrs ago  . CHOLECYSTECTOMY  2010  . DILATION AND CURETTAGE OF UTERUS    . HYSTEROSCOPY    . mole removed  2014    There were no vitals filed for this visit.          ADULT SLP TREATMENT - 08/01/20 0001      General Information   Behavior/Cognition Alert;Cooperative;Pleasant mood    HPI Anna Williams is a 74 y.o. female with medical history significant of anxiety disorder, diabetes, hypertension, hyperlipidemia, history of pulmonary embolism, with recent infarct in the left anterior basal ganglia involving lenticular nucleus and internal capsule (03/10/2020). Per chart, pt exhibited dysarthria which cleared during acute hospitalization. Of note pt fell striking back of head on 03/06/2020 with diagnosis of concussion and altered mental status.      Treatment Provided   Treatment provided Cognitive-Linquistic      Pain Assessment   Pain Assessment No/denies pain      Cognitive-Linquistic Treatment   Treatment focused on Cognition    Skilled Treatment Skilled treatment session trageted  pt's memory goals. SLP facilitated session by providing moderate assistance to visualize and follow 2 step directions. Pt stated that the more information that the more distracted her mind feels in selecting the correct information.       Assessment / Recommendations / Plan   Plan Continue with current plan of care      Progression Toward Goals   Progression toward goals Progressing toward goals              SLP Short Term Goals - 07/15/20 1520      SLP SHORT TERM GOAL #5   Title Given minimal cues, pt will solve semi-complex reasoning tasks with 90% accuracy.    Status Achieved      Additional Short Term Goals   Additional Short Term Goals Yes      SLP SHORT TERM GOAL #6   Title With moderate cues, pt will demonstrate selective attention to task for 5 minutes in a mildly distractive environment in 7 out of 10 opportunities.    Status Achieved      SLP SHORT TERM GOAL #7   Title With moderate cues, pt will utilize memory strategy to recall basic daily information with 75% accuracy.    Status Achieved      SLP SHORT TERM GOAL #8   Title With minimal cues, pt will verbally organize basic information to increase listener understanding in 7 out of 10 opportunities.  Status Achieved      SLP SHORT TERM GOAL  #9   TITLE With supervision, pt will solve semi-complex reasoning tasks with 90% accuracy.    Baseline Minimal cues    Time 10    Period --   sessions   Status New      SLP SHORT TERM GOAL  #10   TITLE Pt will demonstrate alternating attention between 2 basic tasks with minimal cues.    Baseline new goal for alternating attention    Time 10    Period --   sessions   Status New            SLP Long Term Goals - 07/15/20 1526      SLP LONG TERM GOAL #1   Title Pt will identify cognitive-communication barriers and participate in developing compensatory strategies.    Status On-going      SLP LONG TERM GOAL #2   Title Pt will increase executive cognitive  functions to complete complex reasoning tasks.    Status On-going      SLP LONG TERM GOAL #3   Title Pt will use memory strategies with minimal assistance to recall basic daily information.    Status Achieved            Plan - 08/01/20 1612    Clinical Impression Statement Skilled treatment session trageted pt's memory goals. SLP facilitated session by providing moderate assistance to visualize and follow 2 step directions. Pt stated that the more information that the more distracted her mind feels in selecting the correct information.     Speech Therapy Frequency 2x / week    Duration 12 weeks    Treatment/Interventions Cognitive reorganization;Patient/family education;SLP instruction and feedback    Potential to Achieve Goals Good    SLP Home Exercise Plan write down associations for word list    Consulted and Agree with Plan of Care Patient           Patient will benefit from skilled therapeutic intervention in order to improve the following deficits and impairments:   Cognitive communication deficit    Problem List Patient Active Problem List   Diagnosis Date Noted  . CVA (cerebral vascular accident) ([place]) 03/10/2020  . Vaginal atrophy 04/29/2016  . Fibroid, uterine 04/29/2016  . Pelvic pain in female 04/29/2016  . Hypertension 10/16/2015  . Diabetes mellitus type 2 without retinopathy ([place]) 05/27/2015  . Menopause 04/29/2015  . Fibroids 04/29/2015  . Anxiety 04/29/2015  . Cervical dysplasia 04/29/2015  . History of pulmonary embolus (PE) 04/29/2015  . Family history of breast cancer in mother 04/29/2015  . Retinal detachment 04/29/2015   Yolando Gillum B. Rutherford Nail M.S., CCC-SLP, [place] Pathologist Rehabilitation Services Office [phone]  Stormy Fabian 08/04/2020, 9:38 AM  [place] MAIN [facility] SERVICES [address] [place], [place], [address] Phone: [phone]   Fax:  [phone]   Name: Anna Williams MRN: [id number] Date of Birth: Oct 16, 1945

## 2020-08-05 ENCOUNTER — Ambulatory Visit: Payer: Medicare Other | Admitting: Speech Pathology

## 2020-08-05 ENCOUNTER — Other Ambulatory Visit: Payer: Self-pay

## 2020-08-05 DIAGNOSIS — R41841 Cognitive communication deficit: Secondary | ICD-10-CM

## 2020-08-06 ENCOUNTER — Encounter: Payer: Self-pay | Admitting: Speech Pathology

## 2020-08-06 NOTE — Therapy (Signed)
[place] MAIN [facility] SERVICES [address] [place], [place], [address] Phone: [phone]   Fax:  [phone]  Speech Language Pathology Treatment  Patient Details  Name: Anna Williams MRN: [id number] Date of Birth: [birth year] Referring Provider (SLP): Miguel Aschoff   Encounter Date: 08/05/2020   End of Session - 08/06/20 2208    Visit Number 35    Number of Visits 44    Date for SLP Re-Evaluation 09/02/20    Authorization Type [employer]    Authorization Time Period 06/09/2020 thru 09/02/2020    Authorization - Visit Number 5    Progress Note Due on Visit 10    SLP Start Time 1100    SLP Stop Time  1200    SLP Time Calculation (min) 60 min    Activity Tolerance Patient tolerated treatment well           Past Medical History:  Diagnosis Date  . Anxiety and depression   . ASCUS with positive high risk HPV 09/2013  . Diabetes mellitus without complication ([place])    type 2  . Elevated blood sugar   . Family history of breast cancer in mother   . Fibroid uterus   . History of pulmonary embolus (PE)   . Hypertension   . Menopause   . Mild dysplasia of cervix 09/2013   repeat pap done -ecc neg, cerival bx cin 1  . Retinal detachment     Past Surgical History:  Procedure Laterality Date  . anterior neck fusion    . BREAST CYST ASPIRATION Left yrs ago  . CHOLECYSTECTOMY  2010  . DILATION AND CURETTAGE OF UTERUS    . HYSTEROSCOPY    . mole removed  2014    There were no vitals filed for this visit.   Subjective Assessment - 08/06/20 2130    Subjective Pt continues to feel better and appears in better spirits    Currently in Pain? No/denies                 ADULT SLP TREATMENT - 08/06/20 0001      General Information   Behavior/Cognition Alert;Cooperative;Pleasant mood    HPI Anna Williams is a 74 y.o. female with medical history significant of anxiety disorder, diabetes, hypertension,  hyperlipidemia, history of pulmonary embolism, with recent infarct in the left anterior basal ganglia involving lenticular nucleus and internal capsule (03/10/2020). Per chart, pt exhibited dysarthria which cleared during acute hospitalization. Of note pt fell striking back of head on 03/06/2020 with diagnosis of concussion and altered mental status.      Treatment Provided   Treatment provided Cognitive-Linquistic      Pain Assessment   Pain Assessment No/denies pain      Cognitive-Linquistic Treatment   Treatment focused on Cognition    Skilled Treatment independently recall activities from previous day as well as activities that will take place this afternoon; good recall of external and internal memory strategies      Assessment / Recommendations / Plan   Plan Continue with current plan of care      Progression Toward Goals   Progression toward goals Progressing toward goals            SLP Education - 08/06/20 2201    Education Details external and internal memory strategies    Person(s) Educated Patient    Methods Explanation;Demonstration;Verbal cues    Comprehension Verbalized understanding;Returned demonstration  SLP Short Term Goals - 07/15/20 1520      SLP SHORT TERM GOAL #5   Title Given minimal cues, pt will solve semi-complex reasoning tasks with 90% accuracy.    Status Achieved      Additional Short Term Goals   Additional Short Term Goals Yes      SLP SHORT TERM GOAL #6   Title With moderate cues, pt will demonstrate selective attention to task for 5 minutes in a mildly distractive environment in 7 out of 10 opportunities.    Status Achieved      SLP SHORT TERM GOAL #7   Title With moderate cues, pt will utilize memory strategy to recall basic daily information with 75% accuracy.    Status Achieved      SLP SHORT TERM GOAL #8   Title With minimal cues, pt will verbally organize basic information to increase listener understanding in 7 out of 10  opportunities.    Status Achieved      SLP SHORT TERM GOAL  #9   TITLE With supervision, pt will solve semi-complex reasoning tasks with 90% accuracy.    Baseline Minimal cues    Time 10    Period --   sessions   Status New      SLP SHORT TERM GOAL  #10   TITLE Pt will demonstrate alternating attention between 2 basic tasks with minimal cues.    Baseline new goal for alternating attention    Time 10    Period --   sessions   Status New            SLP Long Term Goals - 07/15/20 1526      SLP LONG TERM GOAL #1   Title Pt will identify cognitive-communication barriers and participate in developing compensatory strategies.    Status On-going      SLP LONG TERM GOAL #2   Title Pt will increase executive cognitive functions to complete complex reasoning tasks.    Status On-going      SLP LONG TERM GOAL #3   Title Pt will use memory strategies with minimal assistance to recall basic daily information.    Status Achieved            Plan - 08/06/20 2209    Clinical Impression Statement Skilled treatment session targeted pt's cognitive communication goals, specifically external and internal memory strategies. Pt independently reports specific examples of how she has utilized various strategies with success. She continues to state that she feels much improved and "is thinking more like herself."    Speech Therapy Frequency 2x / week    Duration 12 weeks    Treatment/Interventions Cognitive reorganization;Patient/family education;SLP instruction and feedback    Potential to [place] keep a list of memory strategies used and their effect    Consulted and Agree with Plan of Care Patient           Patient will benefit from skilled therapeutic intervention in order to improve the following deficits and impairments:   Cognitive communication deficit    Problem List Patient Active Problem List   Diagnosis Date Noted  . CVA (cerebral  vascular accident) ([place]) 03/10/2020  . Vaginal atrophy 04/29/2016  . Fibroid, uterine 04/29/2016  . Pelvic pain in female 04/29/2016  . Hypertension 10/16/2015  . Diabetes mellitus type 2 without retinopathy ([place]) 05/27/2015  . Menopause 04/29/2015  . Fibroids 04/29/2015  . Anxiety 04/29/2015  . Cervical dysplasia  04/29/2015  . History of pulmonary embolus (PE) 04/29/2015  . Family history of breast cancer in mother 04/29/2015  . Retinal detachment 04/29/2015   Mayrani Khamis B. Rutherford Nail M.S., CCC-SLP, [place] Pathologist Rehabilitation Services Office [phone]  Stormy Fabian 08/06/2020, 10:12 PM  [place] MAIN [facility] SERVICES [address] [place], [place], [address] Phone: [phone]   Fax:  [phone]   Name: KELLEIGH SKERRITT MRN: [id number] Date of Birth: 16-Apr-1946

## 2020-08-07 ENCOUNTER — Ambulatory Visit: Payer: Medicare Other | Admitting: Speech Pathology

## 2020-08-14 ENCOUNTER — Ambulatory Visit: Payer: Medicare Other | Attending: Family Medicine | Admitting: Speech Pathology

## 2020-08-14 ENCOUNTER — Other Ambulatory Visit: Payer: Self-pay

## 2020-08-14 ENCOUNTER — Encounter: Payer: Self-pay | Admitting: Speech Pathology

## 2020-08-14 ENCOUNTER — Other Ambulatory Visit: Payer: Self-pay | Admitting: Family Medicine

## 2020-08-14 DIAGNOSIS — F419 Anxiety disorder, unspecified: Secondary | ICD-10-CM

## 2020-08-14 DIAGNOSIS — R41841 Cognitive communication deficit: Secondary | ICD-10-CM | POA: Insufficient documentation

## 2020-08-14 NOTE — Telephone Encounter (Signed)
   Notes to clinic:  REQUEST FOR 90 DAYS PRESCRIPTION. DX Code Needed Review for change in Qty  Requested Prescriptions  Pending Prescriptions Disp Refills   sertraline (ZOLOFT) 50 MG tablet [Pharmacy Med Name: SERTRALINE HCL 50 MG TABLET] 90 tablet 1    Sig: TAKE 1 TABLET BY MOUTH EVERY DAY      Psychiatry:  Antidepressants - SSRI Passed - 08/14/2020  1:34 PM      Passed - Valid encounter within last 6 months    Recent Outpatient Visits           3 weeks ago [place] Jerrol Banana., MD   1 month ago Diabetes mellitus type 2 without retinopathy [facility])   [facility] Jerrol Banana., MD   2 months ago Diabetes mellitus type 2 without retinopathy [facility])   [facility] Jerrol Banana., MD   3 months ago Altered mental status, unspecified altered mental status type   [facility] Jerrol Banana., MD   4 months ago CVA, old, cognitive deficits   [facility] Jerrol Banana., MD       Future Appointments             In 2 weeks Jerrol Banana., MD [facility]

## 2020-08-14 NOTE — Therapy (Signed)
[place] MAIN [facility] SERVICES [address] [place], [place], [address] Phone: [phone]   Fax:  [phone]  Speech Language Pathology Treatment DISCHARGE SUMMARY  Patient Details  Name: Anna Williams MRN: [id number] Date of Birth: April 30, 1946 Referring Provider (SLP): Miguel Aschoff   Encounter Date: 08/14/2020   End of Session - 08/14/20 1024    Visit Number 36    Number of Visits 63    Date for SLP Re-Evaluation 09/02/20    Authorization Type [employer]    Authorization Time Period 06/09/2020 thru 09/02/2020    Authorization - Visit Number 6    Progress Note Due on Visit 10    SLP Start Time 1005    SLP Stop Time  1050    SLP Time Calculation (min) 45 min    Activity Tolerance Patient tolerated treatment well           Past Medical History:  Diagnosis Date  . Anxiety and depression   . ASCUS with positive high risk HPV 09/2013  . Diabetes mellitus without complication ([place])    type 2  . Elevated blood sugar   . Family history of breast cancer in mother   . Fibroid uterus   . History of pulmonary embolus (PE)   . Hypertension   . Menopause   . Mild dysplasia of cervix 09/2013   repeat pap done -ecc neg, cerival bx cin 1  . Retinal detachment     Past Surgical History:  Procedure Laterality Date  . anterior neck fusion    . BREAST CYST ASPIRATION Left yrs ago  . CHOLECYSTECTOMY  2010  . DILATION AND CURETTAGE OF UTERUS    . HYSTEROSCOPY    . mole removed  2014    There were no vitals filed for this visit.   Subjective Assessment - 08/14/20 1022    Subjective pt states that she "feels like herself again" -    Currently in Pain? No/denies                 ADULT SLP TREATMENT - 08/14/20 0001      General Information   Behavior/Cognition Alert;Cooperative;Pleasant mood    HPI Anna Williams is a 74 y.o. female with medical history significant of anxiety disorder, diabetes,  hypertension, hyperlipidemia, history of pulmonary embolism, with recent infarct in the left anterior basal ganglia involving lenticular nucleus and internal capsule (03/10/2020). Per chart, pt exhibited dysarthria which cleared during acute hospitalization. Of note pt fell striking back of head on 03/06/2020 with diagnosis of concussion and altered mental status.      Treatment Provided   Treatment provided Cognitive-Linquistic      Pain Assessment   Pain Assessment No/denies pain      Cognitive-Linquistic Treatment   Treatment focused on Cognition;Patient/family/caregiver education    Skilled Treatment Independently recalled external and internal memory strategies      Assessment / Recommendations / Plan   Plan All goals met      Progression Toward Goals   Progression toward goals Goals met, education completed, patient discharged from SLP            SLP Education - 08/14/20 1023    Education Details external and internal memory strategies, organization of thought    Person(s) Educated Patient    Methods Explanation;Handout    Comprehension Verbalized understanding;Returned demonstration            SLP Short Term Goals - 08/14/20 1035  SLP SHORT TERM GOAL  #9   TITLE With supervision, pt will solve semi-complex reasoning tasks with 90% accuracy.    Status Achieved      SLP SHORT TERM GOAL  #10   TITLE Pt will demonstrate alternating attention between 2 basic tasks with minimal cues.    Status Achieved            SLP Long Term Goals - 08/14/20 1037      SLP LONG TERM GOAL #1   Title Pt will identify cognitive-communication barriers and participate in developing compensatory strategies.    Status Achieved      SLP LONG TERM GOAL #2   Title Pt will increase executive cognitive functions to complete complex reasoning tasks.    Status Achieved            Plan - 08/14/20 1025    Clinical Impression Statement Pt has made great progress during skilled ST  sessions and as a result, she has met all of her 10 STGs and 3 LTGs. Pt has made great progress in organization of thought, processing speed and memory that resulted in increased executive function abilities. In addition to cognitive communication progress, pt has made tremendous social emotional growth. This is evidenced by recent out of town trip to [place]. She reports no fear but only confidence while being out of town and when interacting with friends. This is a great improvement over previous out of town trips. In fact, pt shared her stroke recovery story with a customer at work. At this time, pt demonstrates independent cognitive communication abilities and as such pt no longer requires skilled ST intervention. All education has been completed.    Treatment/Interventions Cognitive reorganization;Patient/family education;SLP instruction and feedback    Potential to Achieve Goals Good    SLP Home Exercise Plan continue using compensatory memory strategies    Consulted and Agree with Plan of Care Patient           Patient will benefit from skilled therapeutic intervention in order to improve the following deficits and impairments:   Cognitive communication deficit    Problem List Patient Active Problem List   Diagnosis Date Noted  . CVA (cerebral vascular accident) ([place]) 03/10/2020  . Vaginal atrophy 04/29/2016  . Fibroid, uterine 04/29/2016  . Pelvic pain in female 04/29/2016  . Hypertension 10/16/2015  . Diabetes mellitus type 2 without retinopathy ([place]) 05/27/2015  . Menopause 04/29/2015  . Fibroids 04/29/2015  . Anxiety 04/29/2015  . Cervical dysplasia 04/29/2015  . History of pulmonary embolus (PE) 04/29/2015  . Family history of breast cancer in mother 04/29/2015  . Retinal detachment 04/29/2015   Lilton Pare B. Rutherford Nail M.S., CCC-SLP, [place] Pathologist Rehabilitation Services Office [phone]  Stormy Fabian 08/14/2020, 10:38 AM  [place] MAIN [facility] SERVICES [address] [place], [place], [address] Phone: [phone]   Fax:  [phone]   Name: ZANYA LINDO MRN: [id number] Date of Birth: 03/07/1946

## 2020-08-19 ENCOUNTER — Encounter: Payer: Medicare Other | Admitting: Speech Pathology

## 2020-08-21 ENCOUNTER — Encounter: Payer: Medicare Other | Admitting: Speech Pathology

## 2020-08-26 ENCOUNTER — Encounter: Payer: Medicare Other | Admitting: Speech Pathology

## 2020-08-28 ENCOUNTER — Ambulatory Visit: Payer: Self-pay | Admitting: Family Medicine

## 2020-08-28 ENCOUNTER — Encounter: Payer: Medicare Other | Admitting: Speech Pathology

## 2020-08-30 ENCOUNTER — Other Ambulatory Visit: Payer: Self-pay | Admitting: Family Medicine

## 2020-08-30 DIAGNOSIS — E119 Type 2 diabetes mellitus without complications: Secondary | ICD-10-CM

## 2020-09-02 ENCOUNTER — Encounter: Payer: Medicare Other | Admitting: Speech Pathology

## 2020-09-09 ENCOUNTER — Encounter: Payer: Medicare Other | Admitting: Speech Pathology

## 2020-09-11 ENCOUNTER — Encounter: Payer: Medicare Other | Admitting: Speech Pathology

## 2020-09-16 ENCOUNTER — Encounter: Payer: Medicare Other | Admitting: Speech Pathology

## 2020-09-17 ENCOUNTER — Other Ambulatory Visit: Payer: Self-pay

## 2020-09-17 ENCOUNTER — Encounter: Payer: Self-pay | Admitting: Family Medicine

## 2020-09-17 ENCOUNTER — Ambulatory Visit (INDEPENDENT_AMBULATORY_CARE_PROVIDER_SITE_OTHER): Payer: Medicare Other | Admitting: Family Medicine

## 2020-09-17 VITALS — BP 117/71 | HR 83 | Temp 98.5°F | Resp 16 | Ht 67.0 in | Wt 146.0 lb

## 2020-09-17 DIAGNOSIS — R197 Diarrhea, unspecified: Secondary | ICD-10-CM | POA: Diagnosis not present

## 2020-09-17 DIAGNOSIS — Z23 Encounter for immunization: Secondary | ICD-10-CM

## 2020-09-17 DIAGNOSIS — S060X0S Concussion without loss of consciousness, sequela: Secondary | ICD-10-CM

## 2020-09-17 DIAGNOSIS — I639 Cerebral infarction, unspecified: Secondary | ICD-10-CM

## 2020-09-17 DIAGNOSIS — K219 Gastro-esophageal reflux disease without esophagitis: Secondary | ICD-10-CM | POA: Diagnosis not present

## 2020-09-17 DIAGNOSIS — F419 Anxiety disorder, unspecified: Secondary | ICD-10-CM | POA: Diagnosis not present

## 2020-09-17 NOTE — Progress Notes (Signed)
I,[name],acting as a scribe for Wilhemena Durie, MD.,have documented all relevant documentation on the behalf of Wilhemena Durie, MD,as directed by  Wilhemena Durie, MD while in the presence of Wilhemena Durie, MD.   Established patient visit   Patient: Anna Williams   DOB: [birth year]   74 y.o. Female  MRN: [id number] Visit Date: 09/17/2020  Today's healthcare provider: Wilhemena Durie, MD   Chief Complaint  Patient presents with  . Anxiety  . Follow-up   Subjective    HPI  Patient feels well and has no complaints.  She is improving.  Speech is improving. Anxiety, Follow-up  She was last seen for anxiety 2 months ago. Changes made at last visit include; Worse on Wellbutrin.  Changeed citalopram to sertraline 50 mg daily.  Follow-up in 1 month.   She reports good compliance with treatment. She reports good tolerance of treatment. She is not having side effects. none  She feels her anxiety is mild and Improved since last visit.  Symptoms: No chest pain No difficulty concentrating  No dizziness Yes fatigue  No feelings of losing control No insomnia  No irritable No palpitations  No panic attacks No racing thoughts  No shortness of breath No sweating  No tremors/shakes    GAD-7 Results GAD-7 Generalized Anxiety Disorder Screening Tool 09/17/2020 06/30/2020  1. Feeling Nervous, Anxious, or on Edge 0 3  2. Not Being Able to Stop or Control Worrying 0 2  3. Worrying Too Much About Different Things 0 2  4. Trouble Relaxing 0 2  5. Being So Restless it's Hard To Sit Still 0 2  6. Becoming Easily Annoyed or Irritable 0 3  7. Feeling Afraid As If Something Awful Might Happen 0 3  Total GAD-7 Score 0 17  Difficulty At Work, Home, or Getting  Along With Others? Not difficult at all Not difficult at all    PHQ-9 Scores PHQ9 SCORE ONLY 09/17/2020 06/30/2020 06/30/2020  PHQ-9 Total Score 1 17 16      --------------------------------------------------------------------  Patient states she feels much improved on sertraline. Patient states she has been sleeping well.   Gastroesophageal reflux disease From 07/23/2020-Increased omeprazole to twice daily for symptom relief.  Concussion without loss of consciousness, sequela ([place]) From 07/23/2020-Memory problems started with a concussion before her stroke.  This seems to be improving. On Lipitor.  Past Medical History:  Diagnosis Date  . Anxiety and depression   . ASCUS with positive high risk HPV 09/2013  . Diabetes mellitus without complication ([place])    type 2  . Elevated blood sugar   . Family history of breast cancer in mother   . Fibroid uterus   . History of pulmonary embolus (PE)   . Hypertension   . Menopause   . Mild dysplasia of cervix 09/2013   repeat pap done -ecc neg, cerival bx cin 1  . Retinal detachment        Medications: Outpatient Medications Prior to Visit  Medication Sig  . amLODipine (NORVASC) 5 MG tablet Take 1 tablet (5 mg total) by mouth daily.  Marland Kitchen Apoaequorin (PREVAGEN) 10 MG CAPS Take 10 mg by mouth daily at 12 noon.   Marland Kitchen atorvastatin (LIPITOR) 40 MG tablet TAKE 1 TABLET BY MOUTH EVERY DAY  . Calcium Citrate-Vitamin D (CALCIUM + D PO) Take 1 tablet by mouth daily.  Marland Kitchen glucose blood (ONE TOUCH ULTRA TEST) test strip CHECK SUGAR ONCE DAILY  . lidocaine (LIDODERM) 5 %  APPLY PATCH TO AFFECTED AREA WHEN AWAKE.  . metFORMIN (GLUCOPHAGE-XR) 500 MG 24 hr tablet TAKE 1 TABLET BY MOUTH EVERY DAY WITH BREAKFAST  . mirtazapine (REMERON) 30 MG tablet TAKE 1 TABLET BY MOUTH AT BEDTIME.  . Multiple Vitamins-Minerals (PRESERVISION/LUTEIN PO) Take by mouth daily.   Marland Kitchen omeprazole (PRILOSEC) 20 MG capsule Take 1 capsule (20 mg total) by mouth 2 (two) times daily before a meal.  . ONETOUCH DELICA LANCETS FINE MISC Check sugar once daily  . pioglitazone (ACTOS) 30 MG tablet Take 0.5 tablets (15 mg total) by mouth  daily.  . sertraline (ZOLOFT) 50 MG tablet TAKE 1 TABLET BY MOUTH EVERY DAY  . buPROPion (WELLBUTRIN SR) 100 MG 12 hr tablet TAKE 1 TABLET BY MOUTH TWICE A DAY (Patient not taking: Reported on 07/23/2020)  . JARDIANCE 25 MG TABS tablet TAKE 1 TABLET BY MOUTH DAILY (Patient not taking: Reported on 06/09/2020)   No facility-administered medications prior to visit.    Review of Systems  Constitutional: Negative for appetite change, chills, fatigue and fever.  Respiratory: Negative for chest tightness and shortness of breath.   Cardiovascular: Negative for chest pain and palpitations.  Gastrointestinal: Negative for abdominal pain, nausea and vomiting.  Neurological: Negative for dizziness and weakness.       Objective    BP 117/71 (BP Location: Left Arm, Patient Position: Sitting, Cuff Size: Normal)   Pulse 83   Temp 98.5 F (36.9 C) (Oral)   Resp 16   Ht 5\' 7"  (1.702 m)   Wt 146 lb (66.2 kg)   SpO2 97%   BMI 22.87 kg/m  BP Readings from Last 3 Encounters:  09/17/20 117/71  07/23/20 130/73  06/09/20 132/75   Wt Readings from Last 3 Encounters:  09/17/20 146 lb (66.2 kg)  07/23/20 148 lb (67.1 kg)  06/09/20 154 lb 6.4 oz (70 kg)      Physical Exam Vitals reviewed.  Constitutional:      Appearance: Normal appearance. She is normal weight.  Eyes:     General: No scleral icterus.    Conjunctiva/sclera: Conjunctivae normal.  Cardiovascular:     Rate and Rhythm: Normal rate and regular rhythm.     Pulses: Normal pulses.     Heart sounds: Normal heart sounds.  Pulmonary:     Effort: Pulmonary effort is normal.     Breath sounds: Normal breath sounds.  Abdominal:     General: Bowel sounds are normal.     Palpations: Abdomen is soft.  Musculoskeletal:     Cervical back: Normal range of motion and neck supple.     Right lower leg: No edema.     Left lower leg: No edema.  Skin:    General: Skin is warm and dry.  Neurological:     General: No focal deficit present.      Mental Status: She is alert and oriented to person, place, and time.  Psychiatric:        Mood and Affect: Mood normal.        Behavior: Behavior normal.        Thought Content: Thought content normal.        Judgment: Judgment normal.     Comments:  patient is anxious today.       No results found for any visits on 09/17/20.  Assessment & Plan     1. Anxiety   2. Need for influenza vaccination  - Flu Vaccine QUAD High Dose(Fluad)  3. Gastroesophageal reflux disease,  unspecified whether esophagitis present   4. Diarrhea, unspecified type Try over-the-counter Probiotic 5. Cerebrovascular accident (CVA), unspecified mechanism ([place]) Improving with speech and cognition.  6. Concussion without loss of consciousness, sequela ([facility]) Cognition improving. 7.  Type 2 diabetes A1c on next visit. No follow-ups on file.         Elgin Carn Cranford Mon, MD  [facility] [phone] (phone) [phone] (fax)  [place]

## 2020-09-17 NOTE — Patient Instructions (Signed)
Try over-the-counter Probiotic.

## 2020-09-18 ENCOUNTER — Encounter: Payer: Medicare Other | Admitting: Speech Pathology

## 2020-09-23 ENCOUNTER — Encounter: Payer: Medicare Other | Admitting: Speech Pathology

## 2020-09-25 ENCOUNTER — Encounter: Payer: Medicare Other | Admitting: Speech Pathology

## 2020-09-30 ENCOUNTER — Encounter: Payer: Medicare Other | Admitting: Speech Pathology

## 2020-10-02 ENCOUNTER — Encounter: Payer: Medicare Other | Admitting: Speech Pathology

## 2020-10-14 ENCOUNTER — Telehealth: Payer: Self-pay

## 2020-10-14 DIAGNOSIS — U071 COVID-19: Secondary | ICD-10-CM | POA: Diagnosis not present

## 2020-10-14 DIAGNOSIS — Z03818 Encounter for observation for suspected exposure to other biological agents ruled out: Secondary | ICD-10-CM | POA: Diagnosis not present

## 2020-10-14 DIAGNOSIS — Z20822 Contact with and (suspected) exposure to covid-19: Secondary | ICD-10-CM | POA: Diagnosis not present

## 2020-10-14 NOTE — Telephone Encounter (Signed)
Copied from CRM [id number]. Topic: General - Other >> Oct 14, 2020 12:01 PM Ezrah, [place] wrote: Reason for CRM: Patient called to ask the nurse to advise her on what kind of treatment she should take because she has tested positive for covid but has had her 2 vaccines and a booster.  Please advise and call patient to discus at [phone]

## 2020-10-15 ENCOUNTER — Encounter: Payer: Self-pay | Admitting: Physician Assistant

## 2020-10-15 ENCOUNTER — Telehealth (INDEPENDENT_AMBULATORY_CARE_PROVIDER_SITE_OTHER): Payer: Medicare Other | Admitting: Physician Assistant

## 2020-10-15 DIAGNOSIS — U071 COVID-19: Secondary | ICD-10-CM

## 2020-10-15 NOTE — Patient Instructions (Signed)
Can take to lessen severity: Vit C 500mg twice daily Quercertin 250-500mg twice daily Zinc 75-100mg daily Melatonin 3-6 mg at bedtime Vit D3 1000-2000 IU daily Aspirin 81 mg daily with food Optional: Famotidine 20mg daily Also can add tylenol/ibuprofen as needed for fevers and body aches May add Mucinex or Mucinex DM as needed for cough/congestion  COVID-19 COVID-19 is a respiratory infection that is caused by a virus called severe acute respiratory syndrome coronavirus 2 (SARS-CoV-2). The disease is also known as coronavirus disease or novel coronavirus. In some people, the virus may not cause any symptoms. In others, it may cause a serious infection. The infection can get worse quickly and can lead to complications, such as:  Pneumonia, or infection of the lungs.  Acute respiratory distress syndrome or ARDS. This is a condition in which fluid build-up in the lungs prevents the lungs from filling with air and passing oxygen into the blood.  Acute respiratory failure. This is a condition in which there is not enough oxygen passing from the lungs to the body or when carbon dioxide is not passing from the lungs out of the body.  Sepsis or septic shock. This is a serious bodily reaction to an infection.  Blood clotting problems.  Secondary infections due to bacteria or fungus.  Organ failure. This is when your body's organs stop working. The virus that causes COVID-19 is contagious. This means that it can spread from person to person through droplets from coughs and sneezes (respiratory secretions). What are the causes? This illness is caused by a virus. You may catch the virus by:  Breathing in droplets from an infected person. Droplets can be spread by a person breathing, speaking, singing, coughing, or sneezing.  Touching something, like a table or a doorknob, that was exposed to the virus (contaminated) and then touching your mouth, nose, or eyes. What increases the risk? Risk for  infection You are more likely to be infected with this virus if you:  Are within 6 feet (2 meters) of a person with COVID-19.  Provide care for or live with a person who is infected with COVID-19.  Spend time in crowded indoor spaces or live in shared housing. Risk for serious illness You are more likely to become seriously ill from the virus if you:  Are 50 years of age or older. The higher your age, the more you are at risk for serious illness.  Live in a nursing home or long-term care facility.  Have cancer.  Have a long-term (chronic) disease such as: ? Chronic lung disease, including chronic obstructive pulmonary disease or asthma. ? A long-term disease that lowers your body's ability to fight infection (immunocompromised). ? Heart disease, including heart failure, a condition in which the arteries that lead to the heart become narrow or blocked (coronary artery disease), a disease which makes the heart muscle thick, weak, or stiff (cardiomyopathy). ? Diabetes. ? Chronic kidney disease. ? Sickle cell disease, a condition in which red blood cells have an abnormal "sickle" shape. ? Liver disease.  Are obese. What are the signs or symptoms? Symptoms of this condition can range from mild to severe. Symptoms may appear any time from 2 to 14 days after being exposed to the virus. They include:  A fever or chills.  A cough.  Difficulty breathing.  Headaches, body aches, or muscle aches.  Runny or stuffy (congested) nose.  A sore throat.  New loss of taste or smell. Some people may also have stomach problems,   such as nausea, vomiting, or diarrhea. Other people may not have any symptoms of COVID-19. How is this diagnosed? This condition may be diagnosed based on:  Your signs and symptoms, especially if: ? You live in an area with a COVID-19 outbreak. ? You recently traveled to or from an area where the virus is common. ? You provide care for or live with a person who  was diagnosed with COVID-19. ? You were exposed to a person who was diagnosed with COVID-19.  A physical exam.  Lab tests, which may include: ? Taking a sample of fluid from the back of your nose and throat (nasopharyngeal fluid), your nose, or your throat using a swab. ? A sample of mucus from your lungs (sputum). ? Blood tests.  Imaging tests, which may include, X-rays, CT scan, or ultrasound. How is this treated? At present, there is no medicine to treat COVID-19. Medicines that treat other diseases are being used on a trial basis to see if they are effective against COVID-19. Your health care provider will talk with you about ways to treat your symptoms. For most people, the infection is mild and can be managed at home with rest, fluids, and over-the-counter medicines. Treatment for a serious infection usually takes places in a hospital intensive care unit (ICU). It may include one or more of the following treatments. These treatments are given until your symptoms improve.  Receiving fluids and medicines through an IV.  Supplemental oxygen. Extra oxygen is given through a tube in the nose, a face mask, or a hood.  Positioning you to lie on your stomach (prone position). This makes it easier for oxygen to get into the lungs.  Continuous positive airway pressure (CPAP) or bi-level positive airway pressure (BPAP) machine. This treatment uses mild air pressure to keep the airways open. A tube that is connected to a motor delivers oxygen to the body.  Ventilator. This treatment moves air into and out of the lungs by using a tube that is placed in your windpipe.  Tracheostomy. This is a procedure to create a hole in the neck so that a breathing tube can be inserted.  Extracorporeal membrane oxygenation (ECMO). This procedure gives the lungs a chance to recover by taking over the functions of the heart and lungs. It supplies oxygen to the body and removes carbon dioxide. Follow these  instructions at home: Lifestyle  If you are sick, stay home except to get medical care. Your health care provider will tell you how long to stay home. Call your health care provider before you go for medical care.  Rest at home as told by your health care provider.  Do not use any products that contain nicotine or tobacco, such as cigarettes, e-cigarettes, and chewing tobacco. If you need help quitting, ask your health care provider.  Return to your normal activities as told by your health care provider. Ask your health care provider what activities are safe for you. General instructions  Take over-the-counter and prescription medicines only as told by your health care provider.  Drink enough fluid to keep your urine pale yellow.  Keep all follow-up visits as told by your health care provider. This is important. How is this prevented?  There is no vaccine to help prevent COVID-19 infection. However, there are steps you can take to protect yourself and others from this virus. To protect yourself:   Do not travel to areas where COVID-19 is a risk. The areas where COVID-19 is reported change   often. To identify high-risk areas and travel restrictions, check the CDC travel website: wwwnc.cdc.gov/travel/notices  If you live in, or must travel to, an area where COVID-19 is a risk, take precautions to avoid infection. ? Stay away from people who are sick. ? Wash your hands often with soap and water for 20 seconds. If soap and water are not available, use an alcohol-based hand sanitizer. ? Avoid touching your mouth, face, eyes, or nose. ? Avoid going out in public, follow guidance from your state and local health authorities. ? If you must go out in public, wear a cloth face covering or face mask. Make sure your mask covers your nose and mouth. ? Avoid crowded indoor spaces. Stay at least 6 feet (2 meters) away from others. ? Disinfect objects and surfaces that are frequently touched every day.  This may include:  Counters and tables.  Doorknobs and light switches.  Sinks and faucets.  Electronics, such as phones, remote controls, keyboards, computers, and tablets. To protect others: If you have symptoms of COVID-19, take steps to prevent the virus from spreading to others.  If you think you have a COVID-19 infection, contact your health care provider right away. Tell your health care team that you think you may have a COVID-19 infection.  Stay home. Leave your house only to seek medical care. Do not use public transport.  Do not travel while you are sick.  Wash your hands often with soap and water for 20 seconds. If soap and water are not available, use alcohol-based hand sanitizer.  Stay away from other members of your household. Let healthy household members care for children and pets, if possible. If you have to care for children or pets, wash your hands often and wear a mask. If possible, stay in your own room, separate from others. Use a different bathroom.  Make sure that all people in your household wash their hands well and often.  Cough or sneeze into a tissue or your sleeve or elbow. Do not cough or sneeze into your hand or into the air.  Wear a cloth face covering or face mask. Make sure your mask covers your nose and mouth. Where to find more information  Centers for Disease Control and Prevention: www.cdc.gov/coronavirus/2019-ncov/index.html  World Health Organization: www.who.int/health-topics/coronavirus Contact a health care provider if:  You live in or have traveled to an area where COVID-19 is a risk and you have symptoms of the infection.  You have had contact with someone who has COVID-19 and you have symptoms of the infection. Get help right away if:  You have trouble breathing.  You have pain or pressure in your chest.  You have confusion.  You have bluish lips and fingernails.  You have difficulty waking from sleep.  You have symptoms  that get worse. These symptoms may represent a serious problem that is an emergency. Do not wait to see if the symptoms will go away. Get medical help right away. Call your local emergency services (911 in the [place] Do not drive yourself to the hospital. Let the emergency medical personnel know if you think you have COVID-19. Summary  COVID-19 is a respiratory infection that is caused by a virus. It is also known as coronavirus disease or novel coronavirus. It can cause serious infections, such as pneumonia, acute respiratory distress syndrome, acute respiratory failure, or sepsis.  The virus that causes COVID-19 is contagious. This means that it can spread from person to person through droplets from breathing, speaking,   singing, coughing, or sneezing.  You are more likely to develop a serious illness if you are 50 years of age or older, have a weak immune system, live in a nursing home, or have chronic disease.  There is no medicine to treat COVID-19. Your health care provider will talk with you about ways to treat your symptoms.  Take steps to protect yourself and others from infection. Wash your hands often and disinfect objects and surfaces that are frequently touched every day. Stay away from people who are sick and wear a mask if you are sick. This information is not intended to replace advice given to you by your health care provider. Make sure you discuss any questions you have with your health care provider. Document Revised: 07/27/2019 Document Reviewed: 11/02/2018 Elsevier Patient Education  2020 Elsevier Inc.  

## 2020-10-15 NOTE — Progress Notes (Signed)
MyChart Video Visit    Virtual Visit via Video Note   This visit type was conducted due to national recommendations for restrictions regarding the COVID-19 Pandemic (e.g. social distancing) in an effort to limit this patient's exposure and mitigate transmission in our community. This patient is at least at moderate risk for complications without adequate follow up. This format is felt to be most appropriate for this patient at this time. Physical exam was limited by quality of the video and audio technology used for the visit.   Patient location: Home Provider location: Home office in [place]  I discussed the limitations of evaluation and management by telemedicine and the availability of in person appointments. The patient expressed understanding and agreed to proceed.  Patient: Anna Williams   DOB: 06/18/1946   75 y.o. Female  MRN: [id number] Visit Date: 10/15/2020  Today's healthcare provider: Mar Daring, PA-C   No chief complaint on file.  Subjective    HPI  Anna Williams is a 75 yr old female with history of CVA, PE, HTN, and T2DM presents today for covid 19. She and her husband tested positive for covid 19 on a rapid test yesterday (10/14/20). They report their symptoms started on Saturday, 10/11/20. Her symptoms started with sinus congestion. She denies any significant cough, SOB, or difficulty breathing. She is having increased fatigue and weakness. Denies nausea, vomiting, or diarrhea. She is using Dayquil and Nyquil, Zycam and Vit C. Did finally get a good night sleep last night. Slept for 11 hours.   Patient Active Problem List   Diagnosis Date Noted  . CVA (cerebral vascular accident) ([place]) 03/10/2020  . Vaginal atrophy 04/29/2016  . Fibroid, uterine 04/29/2016  . Pelvic pain in female 04/29/2016  . Hypertension 10/16/2015  . Diabetes mellitus type 2 without retinopathy ([place]) 05/27/2015  . Menopause 04/29/2015  . Fibroids 04/29/2015  . Anxiety  04/29/2015  . Cervical dysplasia 04/29/2015  . History of pulmonary embolus (PE) 04/29/2015  . Family history of breast cancer in mother 04/29/2015  . Retinal detachment 04/29/2015   Past Medical History:  Diagnosis Date  . Anxiety and depression   . ASCUS with positive high risk HPV 09/2013  . Diabetes mellitus without complication ([place])    type 2  . Elevated blood sugar   . Family history of breast cancer in mother   . Fibroid uterus   . History of pulmonary embolus (PE)   . Hypertension   . Menopause   . Mild dysplasia of cervix 09/2013   repeat pap done -ecc neg, cerival bx cin 1  . Retinal detachment       Medications: Outpatient Medications Prior to Visit  Medication Sig  . amLODipine (NORVASC) 5 MG tablet Take 1 tablet (5 mg total) by mouth daily.  Marland Kitchen Apoaequorin (PREVAGEN) 10 MG CAPS Take 10 mg by mouth daily at 12 noon.   Marland Kitchen atorvastatin (LIPITOR) 40 MG tablet TAKE 1 TABLET BY MOUTH EVERY DAY  . buPROPion (WELLBUTRIN SR) 100 MG 12 hr tablet TAKE 1 TABLET BY MOUTH TWICE A DAY (Patient not taking: Reported on 07/23/2020)  . Calcium Citrate-Vitamin D (CALCIUM + D PO) Take 1 tablet by mouth daily.  Marland Kitchen glucose blood (ONE TOUCH ULTRA TEST) test strip CHECK SUGAR ONCE DAILY  . JARDIANCE 25 MG TABS tablet TAKE 1 TABLET BY MOUTH DAILY (Patient not taking: Reported on 06/09/2020)  . lidocaine (LIDODERM) 5 % APPLY PATCH TO AFFECTED AREA WHEN AWAKE.  Marland Kitchen  metFORMIN (GLUCOPHAGE-XR) 500 MG 24 hr tablet TAKE 1 TABLET BY MOUTH EVERY DAY WITH BREAKFAST  . mirtazapine (REMERON) 30 MG tablet TAKE 1 TABLET BY MOUTH AT BEDTIME.  . Multiple Vitamins-Minerals (PRESERVISION/LUTEIN PO) Take by mouth daily.   Marland Kitchen omeprazole (PRILOSEC) 20 MG capsule Take 1 capsule (20 mg total) by mouth 2 (two) times daily before a meal.  . ONETOUCH DELICA LANCETS FINE MISC Check sugar once daily  . pioglitazone (ACTOS) 30 MG tablet Take 0.5 tablets (15 mg total) by mouth daily.  . sertraline (ZOLOFT) 50 MG tablet TAKE  1 TABLET BY MOUTH EVERY DAY   No facility-administered medications prior to visit.    Review of Systems  Constitutional: Positive for fatigue. Negative for fever.  HENT: Positive for congestion, postnasal drip, rhinorrhea, sinus pressure and sinus pain. Negative for sore throat.   Respiratory: Negative.  Negative for cough, chest tightness, shortness of breath and wheezing.   Cardiovascular: Negative.   Gastrointestinal: Negative for abdominal pain and nausea.  Neurological: Positive for headaches. Negative for dizziness and light-headedness.    Last CBC Lab Results  Component Value Date   WBC 4.5 06/09/2020   HGB 12.7 06/09/2020   HCT 37.5 06/09/2020   MCV 94 06/09/2020   MCH 31.9 06/09/2020   RDW 12.2 06/09/2020   PLT 141 (L) 06/09/2020   Last metabolic panel Lab Results  Component Value Date   GLUCOSE 161 (H) 03/11/2020   NA 142 03/11/2020   K 3.2 (L) 03/11/2020   CL 105 03/11/2020   CO2 27 03/11/2020   BUN 9 03/11/2020   CREATININE 0.53 03/11/2020   GFRNONAA >60 03/11/2020   GFRAA >60 03/11/2020   CALCIUM 8.9 03/11/2020   PHOS 4.0 10/16/2015   PROT 6.0 (L) 03/11/2020   ALBUMIN 3.5 03/11/2020   LABGLOB 2.8 12/20/2017   AGRATIO 1.6 12/20/2017   BILITOT 1.0 03/11/2020   ALKPHOS 55 03/11/2020   AST 23 03/11/2020   ALT 26 03/11/2020   ANIONGAP 10 03/11/2020      Objective    There were no vitals taken for this visit. BP Readings from Last 3 Encounters:  09/17/20 117/71  07/23/20 130/73  06/09/20 132/75   Wt Readings from Last 3 Encounters:  09/17/20 146 lb (66.2 kg)  07/23/20 148 lb (67.1 kg)  06/09/20 154 lb 6.4 oz (70 kg)      Physical Exam Vitals reviewed.  Constitutional:      Appearance: Normal appearance. She is well-developed and well-nourished.  HENT:     Head: Normocephalic and atraumatic.  Eyes:     Extraocular Movements: EOM normal.  Pulmonary:     Effort: Pulmonary effort is normal. No respiratory distress.  Musculoskeletal:      Cervical back: Normal range of motion and neck supple.  Neurological:     Mental Status: She is alert.  Psychiatric:        Mood and Affect: Mood and affect and mood normal.        Behavior: Behavior normal.        Thought Content: Thought content normal.        Judgment: Judgment normal.        Assessment & Plan     1. COVID-19 Discussed symptomatic management and return precautions for covid 19. Discussed isolation guidelines. Answered all questions. Advised to call if SOB or difficulty breathing occur. Call if O2 level drops below 90%. Call if symptoms worsening or not improving over the next 5 days.  No follow-ups on file.     I discussed the assessment and treatment plan with the patient. The patient was provided an opportunity to ask questions and all were answered. The patient agreed with the plan and demonstrated an understanding of the instructions.   The patient was advised to call back or seek an in-person evaluation if the symptoms worsen or if the condition fails to improve as anticipated.  I provided 25 minutes of face-to-face time during this encounter via MyChart Video enabled encounter.  Reynolds Bowl, PA-C, have reviewed all documentation for this visit. The documentation on 10/15/20 for the exam, diagnosis, procedures, and orders are all accurate and complete.  [facility] [phone] (phone) [phone] (fax)  [place]

## 2020-10-22 ENCOUNTER — Telehealth: Payer: Self-pay | Admitting: *Deleted

## 2020-10-22 NOTE — Chronic Care Management (AMB) (Signed)
  Chronic Care Management   Note  10/22/2020 Name: CANNA NICKELSON MRN: [id number] DOB: [birth year]  KAELEA GATHRIGHT is a 75 y.o. year old female who is a primary care patient of Jerrol Banana., MD. I reached out to Darl Householder by phone today in response to a referral sent by Ms. [place]'s health plan.     Ms. Rosman was given information about Chronic Care Management services today including:  1. CCM service includes personalized support from designated clinical staff supervised by her physician, including individualized plan of care and coordination with other care providers 2. 24/7 contact phone numbers for assistance for urgent and routine care needs. 3. Service will only be billed when office clinical staff spend 20 minutes or more in a month to coordinate care. 4. Only one practitioner may furnish and bill the service in a calendar month. 5. The patient may stop CCM services at any time (effective at the end of the month) by phone call to the office staff. 6. The patient will be responsible for cost sharing (co-pay) of up to 20% of the service fee (after annual deductible is met).  Patient wishes to consider information provided and/or speak with a member of the care team before deciding about enrollment in care management services.   Follow up plan: Telephone appointment with care management team member scheduled for:11/18/2020  Glynn Freas  Care Guide, Embedded Care Coordination [place]  Care Management  Direct Dial [phone]

## 2020-10-24 ENCOUNTER — Telehealth: Payer: Self-pay

## 2020-10-27 ENCOUNTER — Ambulatory Visit: Payer: Self-pay | Admitting: Family Medicine

## 2020-11-03 NOTE — Telephone Encounter (Signed)
Copied from [place] [phone]. Topic: Appointment Scheduling - Scheduling Inquiry for Clinic >> Oct 24, 2020 12:14 PM Erick Blinks wrote: Reason for CRM: Pt has lost her taste but would like to come into the office. Scheduled for a mychart video visit, wants to know if PCP will approve her coming in. She just had covid 2 weeks ago.  Best contact: [phone]

## 2020-11-05 ENCOUNTER — Telehealth (INDEPENDENT_AMBULATORY_CARE_PROVIDER_SITE_OTHER): Payer: Medicare Other | Admitting: Family Medicine

## 2020-11-05 DIAGNOSIS — U071 COVID-19: Secondary | ICD-10-CM

## 2020-11-05 DIAGNOSIS — R197 Diarrhea, unspecified: Secondary | ICD-10-CM

## 2020-11-05 DIAGNOSIS — G3184 Mild cognitive impairment, so stated: Secondary | ICD-10-CM

## 2020-11-05 MED ORDER — CHOLESTYRAMINE 4 GM/DOSE PO POWD: 4.0000 g | Freq: Two times a day (BID) | ORAL | 12 refills | Status: DC

## 2020-11-05 NOTE — Progress Notes (Signed)
MyChart Video Visit    Virtual Visit via Video Note   This visit type was conducted due to national recommendations for restrictions regarding the COVID-19 Pandemic (e.g. social distancing) in an effort to limit this patient's exposure and mitigate transmission in our community. This patient is at least at moderate risk for complications without adequate follow up. This format is felt to be most appropriate for this patient at this time. Physical exam was limited by quality of the video and audio technology used for the visit.   Patient location: home Provider location: office  I discussed the limitations of evaluation and management by telemedicine and the availability of in person appointments. The patient expressed understanding and agreed to proceed.  Patient: Anna Williams   DOB: 1946-03-16   75 y.o. Female  MRN: [id number] Visit Date: 11/05/2020  Today's healthcare provider: Wilhemena Durie, MD   No chief complaint on file.  Subjective    HPI  Diarrhea intermitant for past few months. [place] water.  Patient postcholecystectomy several years ago. Certainly worse with fatty foods but really bothers her after eating mostly.  No diarrhea abdominal pain and no significant weight loss.  Her weight is down 4 pounds since her last visit in October.     Medications: Outpatient Medications Prior to Visit  Medication Sig  . amLODipine (NORVASC) 5 MG tablet Take 1 tablet (5 mg total) by mouth daily.  Marland Kitchen Apoaequorin (PREVAGEN) 10 MG CAPS Take 10 mg by mouth daily at 12 noon.   Marland Kitchen atorvastatin (LIPITOR) 40 MG tablet TAKE 1 TABLET BY MOUTH EVERY DAY  . buPROPion (WELLBUTRIN SR) 100 MG 12 hr tablet TAKE 1 TABLET BY MOUTH TWICE A DAY (Patient not taking: Reported on 07/23/2020)  . Calcium Citrate-Vitamin D (CALCIUM + D PO) Take 1 tablet by mouth daily.  Marland Kitchen glucose blood (ONE TOUCH ULTRA TEST) test strip CHECK SUGAR ONCE DAILY  . JARDIANCE 25 MG TABS tablet TAKE 1 TABLET BY MOUTH DAILY  (Patient not taking: Reported on 06/09/2020)  . lidocaine (LIDODERM) 5 % APPLY PATCH TO AFFECTED AREA WHEN AWAKE.  . metFORMIN (GLUCOPHAGE-XR) 500 MG 24 hr tablet TAKE 1 TABLET BY MOUTH EVERY DAY WITH BREAKFAST  . mirtazapine (REMERON) 30 MG tablet TAKE 1 TABLET BY MOUTH AT BEDTIME.  . Multiple Vitamins-Minerals (PRESERVISION/LUTEIN PO) Take by mouth daily.   Marland Kitchen omeprazole (PRILOSEC) 20 MG capsule Take 1 capsule (20 mg total) by mouth 2 (two) times daily before a meal.  . ONETOUCH DELICA LANCETS FINE MISC Check sugar once daily  . pioglitazone (ACTOS) 30 MG tablet Take 0.5 tablets (15 mg total) by mouth daily.  . sertraline (ZOLOFT) 50 MG tablet TAKE 1 TABLET BY MOUTH EVERY DAY   No facility-administered medications prior to visit.    Review of Systems     Objective    There were no vitals taken for this visit. Wt Readings from Last 3 Encounters:  09/17/20 146 lb (66.2 kg)  07/23/20 148 lb (67.1 kg)  06/09/20 154 lb 6.4 oz (70 kg)      Physical Exam     Assessment & Plan     1. Diarrhea, unspecified type I do not think this is infectious.  At this time try Questran before meals-she does over the next few weeks.  Encourage fluid intake.  This persist will need GI referral.  May need labs and exam in the office before referral  2. COVID-19 Recovering from COVID-19.  Taste is still a  little off.  3. MCI (mild cognitive impairment) Clinically stable   No follow-ups on file.     I discussed the assessment and treatment plan with the patient. The patient was provided an opportunity to ask questions and all were answered. The patient agreed with the plan and demonstrated an understanding of the instructions.   The patient was advised to call back or seek an in-person evaluation if the symptoms worsen or if the condition fails to improve as anticipated.  I provided 12 minutes of non-face-to-face time during this encounter.  I, Wilhemena Durie, MD, have reviewed all  documentation for this visit. The documentation on 11/05/20 for the exam, diagnosis, procedures, and orders are all accurate and complete.   Kia Stavros Cranford Mon, MD [facility] [phone] (phone) [phone] (fax)  [place]

## 2020-11-06 ENCOUNTER — Telehealth: Payer: Self-pay | Admitting: Family Medicine

## 2020-11-06 NOTE — Telephone Encounter (Signed)
Copied from [place] [id number]. Topic: Medicare AWV >> Nov 06, 2020  3:10 PM Cher Nakai R wrote: Reason for CRM:  No answer unable to leave a message for patient to call back and schedule Medicare Annual Wellness Visit (AWV) in office.   If not able to come in office, please offer to do virtually or by telephone.   Last AWV 07/09/2019  Please schedule at anytime with [facility] Health Advisor.  If any questions, please contact me at [phone]

## 2020-11-18 ENCOUNTER — Telehealth: Payer: Self-pay

## 2020-11-18 ENCOUNTER — Telehealth: Payer: Medicare Other

## 2020-11-18 NOTE — Telephone Encounter (Signed)
  Chronic Care Management   Outreach Note  11/18/2020 Name: Anna Williams MRN: [id number] DOB: 08-30-1946  Primary Care Provider: Jerrol Banana., MD Reason for referral : Chronic Care Management   An unsuccessful telephone outreach was attempted today. Anna Williams was referred to the case management team for assistance with care management and care coordination. Informed by Care Guide that Anna Williams requested to receive additional information about the Chronic Care Management program before agreeing to ongoing outreach.    PLAN A member of the care management team will reach out to Anna Williams again within the next week.   Cristy [facility] Care Management [facility] [phone]

## 2020-11-25 ENCOUNTER — Telehealth: Payer: Self-pay

## 2020-11-25 NOTE — Telephone Encounter (Signed)
  Chronic Care Management   Outreach Note  11/25/2020 Name: TAMIKI KUBA MRN: [id number] DOB: 07-06-1946  Primary Care Provider: Jerrol Banana., MD Reason for referral : Chronic Care Management   An unsuccessful telephone outreach was attempted today. Mrs. Righter was referred to the case management team for assistance with care management and care coordination.     PLAN  A member of the care management team will attempt to reach Mrs. Newland again within the next week.   Cristy [facility]lander [facility] Care Management [facility] [phone]

## 2020-12-01 ENCOUNTER — Other Ambulatory Visit: Payer: Self-pay

## 2020-12-01 ENCOUNTER — Ambulatory Visit (INDEPENDENT_AMBULATORY_CARE_PROVIDER_SITE_OTHER): Payer: Medicare Other | Admitting: Family Medicine

## 2020-12-01 ENCOUNTER — Encounter: Payer: Self-pay | Admitting: Family Medicine

## 2020-12-01 VITALS — BP 130/63 | HR 83 | Temp 98.0°F | Resp 16 | Wt 141.4 lb

## 2020-12-01 DIAGNOSIS — G3184 Mild cognitive impairment, so stated: Secondary | ICD-10-CM

## 2020-12-01 DIAGNOSIS — I639 Cerebral infarction, unspecified: Secondary | ICD-10-CM | POA: Diagnosis not present

## 2020-12-01 DIAGNOSIS — S299XXA Unspecified injury of thorax, initial encounter: Secondary | ICD-10-CM | POA: Diagnosis not present

## 2020-12-01 DIAGNOSIS — M791 Myalgia, unspecified site: Secondary | ICD-10-CM

## 2020-12-01 DIAGNOSIS — Z13220 Encounter for screening for lipoid disorders: Secondary | ICD-10-CM | POA: Diagnosis not present

## 2020-12-01 DIAGNOSIS — E119 Type 2 diabetes mellitus without complications: Secondary | ICD-10-CM | POA: Diagnosis not present

## 2020-12-01 NOTE — Progress Notes (Signed)
Established patient visit   Patient: Anna Williams   DOB: [birth year]   75 y.o. Female  MRN: [id number] Visit Date: 12/01/2020  Today's healthcare provider: Wilhemena Durie, MD   Chief Complaint  Patient presents with  . Fall   Subjective    Fall The accident occurred 5 to 7 days ago. Fall occurred: patient reports that she slipped while on commode and fell on her right side landing between the commode and tub. She fell from a height of 1 to 2 ft. She landed on hard floor. The point of impact was the right shoulder (right side flank). Pain location: right side of ribs. The pain is at a severity of 8/10. The symptoms are aggravated by pressure on injury. Pertinent negatives include no abdominal pain, bowel incontinence, fever, headaches, hearing loss, hematuria, loss of consciousness, nausea, numbness, tingling, visual change or vomiting. She has tried nothing for the symptoms.  Patient had no loss of consciousness that caused the fall. The only trauma or pain that continues is the right posterior lateral ribs   Subjectively the patient's memory /cognition seems to have  gotten worse again recently.  No other neurologic symptoms.  He still has some diarrhea couple times a day.  No blood in her stool, no weight loss, no fevers, no abdominal pain.  Patient Active Problem List   Diagnosis Date Noted  . CVA (cerebral vascular accident) ([place]) 03/10/2020  . Vaginal atrophy 04/29/2016  . Fibroid, uterine 04/29/2016  . Pelvic pain in female 04/29/2016  . Hypertension 10/16/2015  . Diabetes mellitus type 2 without retinopathy ([place]) 05/27/2015  . Menopause 04/29/2015  . Fibroids 04/29/2015  . Anxiety 04/29/2015  . Cervical dysplasia 04/29/2015  . History of pulmonary embolus (PE) 04/29/2015  . Family history of breast cancer in mother 04/29/2015  . Retinal detachment 04/29/2015   Past Medical History:  Diagnosis Date  . Anxiety and depression   . ASCUS with positive high  risk HPV 09/2013  . Diabetes mellitus without complication ([place])    type 2  . Elevated blood sugar   . Family history of breast cancer in mother   . Fibroid uterus   . History of pulmonary embolus (PE)   . Hypertension   . Menopause   . Mild dysplasia of cervix 09/2013   repeat pap done -ecc neg, cerival bx cin 1  . Retinal detachment    Allergies  Allergen Reactions  . Codeine Other (See Comments)    Pt went to sleep and had trouble waking up. Very sensitive to medication.  . Lmx 4 [Lidocaine]        Medications: Outpatient Medications Prior to Visit  Medication Sig  . amLODipine (NORVASC) 5 MG tablet Take 1 tablet (5 mg total) by mouth daily.  Marland Kitchen atorvastatin (LIPITOR) 40 MG tablet TAKE 1 TABLET BY MOUTH EVERY DAY  . Calcium Citrate-Vitamin D (CALCIUM + D PO) Take 1 tablet by mouth daily.  . cholestyramine (QUESTRAN) 4 g packet TAKE 1 PACKET (4 G TOTAL) BY MOUTH 2 (TWO) TIMES DAILY WITH A MEAL.  . cholestyramine (QUESTRAN) 4 GM/DOSE powder Take 1 packet (4 g total) by mouth 2 (two) times daily with a meal.  . mirtazapine (REMERON) 30 MG tablet TAKE 1 TABLET BY MOUTH AT BEDTIME.  . Multiple Vitamins-Minerals (PRESERVISION/LUTEIN PO) Take by mouth daily.   Marland Kitchen omeprazole (PRILOSEC) 20 MG capsule Take 1 capsule (20 mg total) by mouth 2 (two) times daily before a meal.  .  ONETOUCH DELICA LANCETS FINE MISC Check sugar once daily  . pioglitazone (ACTOS) 30 MG tablet Take 0.5 tablets (15 mg total) by mouth daily.  . sertraline (ZOLOFT) 50 MG tablet TAKE 1 TABLET BY MOUTH EVERY DAY  . Apoaequorin (PREVAGEN) 10 MG CAPS Take 10 mg by mouth daily at 12 noon.  (Patient not taking: Reported on 12/01/2020)  . buPROPion (WELLBUTRIN SR) 100 MG 12 hr tablet TAKE 1 TABLET BY MOUTH TWICE A DAY (Patient not taking: No sig reported)  . glucose blood (ONE TOUCH ULTRA TEST) test strip CHECK SUGAR ONCE DAILY (Patient not taking: Reported on 12/01/2020)  . lidocaine (LIDODERM) 5 % APPLY PATCH TO AFFECTED  AREA WHEN AWAKE. (Patient not taking: Reported on 12/01/2020)  . metFORMIN (GLUCOPHAGE-XR) 500 MG 24 hr tablet TAKE 1 TABLET BY MOUTH EVERY DAY WITH BREAKFAST (Patient not taking: Reported on 12/01/2020)  . [DISCONTINUED] JARDIANCE 25 MG TABS tablet TAKE 1 TABLET BY MOUTH DAILY (Patient not taking: Reported on 06/09/2020)   No facility-administered medications prior to visit.    Review of Systems  Constitutional: Negative for fever.  Gastrointestinal: Negative for abdominal pain, bowel incontinence, nausea and vomiting.  Genitourinary: Negative for hematuria.  Neurological: Negative for tingling, loss of consciousness, numbness and headaches.       Objective    BP 130/63   Pulse 83   Temp 98 F (36.7 C) (Oral)   Resp 16   Wt 141 lb 6.4 oz (64.1 kg)   SpO2 96%   BMI 22.15 kg/m     Physical Exam Vitals reviewed.  Constitutional:      Appearance: Normal appearance. She is normal weight.  Eyes:     General: No scleral icterus.    Conjunctiva/sclera: Conjunctivae normal.  Cardiovascular:     Rate and Rhythm: Normal rate and regular rhythm.     Pulses: Normal pulses.     Heart sounds: Normal heart sounds.  Pulmonary:     Effort: Pulmonary effort is normal.     Breath sounds: Normal breath sounds.  Abdominal:     General: Bowel sounds are normal.     Palpations: Abdomen is soft.  Musculoskeletal:     Cervical back: Normal range of motion and neck supple.     Right lower leg: No edema.     Left lower leg: No edema.     Comments: Bruising of right posterior lateral ribs without crepitance or any significant tenderness  Skin:    General: Skin is warm and dry.  Neurological:     General: No focal deficit present.     Mental Status: She is alert. Mental status is at baseline.  Psychiatric:        Mood and Affect: Mood normal.        Behavior: Behavior normal.        Judgment: Judgment normal.     Comments:  patient is anxious today.   Subjectively decreased cognition  today compared to last visits   No results found for any visits on 12/01/20.  Assessment & Plan     1. Trauma of chest, initial encounter Lung sounds are clear and tenderness is minimal.  Obtain x-ray although I think this will be normal. - DG Chest 2 View; Future  2. Diabetes mellitus type 2 without retinopathy ([place]) Follow-up A1c and treat accordingly. - CBC with Differential/Platelet - Hemoglobin A1c - Comprehensive metabolic panel  3. Myalgia Pain this is due to her fall - CBC with Differential/Platelet - CK (Creatine  Kinase) - Sed Rate (ESR)  4. Screening for lipid disorders Atorvastatin with diabetes - Lipid panel  5. Cerebrovascular accident (CVA), unspecified mechanism ([place]) All risk factors treated  6. MCI (mild cognitive impairment) This seems to be getting worse.  Refer back to neurology 7.  Diarrhea Try probiotic daily and stop all dairy products for the time being.  Need referral back to GI.    No follow-ups on file.         Ariele Vidrio Cranford Mon, MD  [facility] [phone] (phone) [phone] (fax)  [place]

## 2020-12-01 NOTE — Patient Instructions (Addendum)
Try to stop dairy products and try a daily probiotic.   Fall Prevention in the Home, Adult Falls can cause injuries and can happen to people of all ages. There are many things you can do to make your home safe and to help prevent falls. Ask for help when making these changes. What actions can I take to prevent falls? General Instructions  Use good lighting in all rooms. Replace any light bulbs that burn out.  Turn on the lights in dark areas. Use night-lights.  Keep items that you use often in easy-to-reach places. Lower the shelves around your home if needed.  Set up your furniture so you have a clear path. Avoid moving your furniture around.  Do not have throw rugs or other things on the floor that can make you trip.  Avoid walking on wet floors.  If any of your floors are uneven, fix them.  Add color or contrast paint or tape to clearly mark and help you see: ? Grab bars or handrails. ? First and last steps of staircases. ? Where the edge of each step is.  If you use a stepladder: ? Make sure that it is fully opened. Do not climb a closed stepladder. ? Make sure the sides of the stepladder are locked in place. ? Ask someone to hold the stepladder while you use it.  Know where your pets are when moving through your home. What can I do in the bathroom?  Keep the floor dry. Clean up any water on the floor right away.  Remove soap buildup in the tub or shower.  Use nonskid mats or decals on the floor of the tub or shower.  Attach bath mats securely with double-sided, nonslip rug tape.  If you need to sit down in the shower, use a plastic, nonslip stool.  Install grab bars by the toilet and in the tub and shower. Do not use towel bars as grab bars.      What can I do in the bedroom?  Make sure that you have a light by your bed that is easy to reach.  Do not use any sheets or blankets for your bed that hang to the floor.  Have a firm chair with side arms that you can  use for support when you get dressed. What can I do in the kitchen?  Clean up any spills right away.  If you need to reach something above you, use a step stool with a grab bar.  Keep electrical cords out of the way.  Do not use floor polish or wax that makes floors slippery. What can I do with my stairs?  Do not leave any items on the stairs.  Make sure that you have a light switch at the top and the bottom of the stairs.  Make sure that there are handrails on both sides of the stairs. Fix handrails that are broken or loose.  Install nonslip stair treads on all your stairs.  Avoid having throw rugs at the top or bottom of the stairs.  Choose a carpet that does not hide the edge of the steps on the stairs.  Check carpeting to make sure that it is firmly attached to the stairs. Fix carpet that is loose or worn. What can I do on the outside of my home?  Use bright outdoor lighting.  Fix the edges of walkways and driveways and fix any cracks.  Remove anything that might make you trip as you walk through  a door, such as a raised step or threshold.  Trim any bushes or trees on paths to your home.  Check to see if handrails are loose or broken and that both sides of all steps have handrails.  Install guardrails along the edges of any raised decks and porches.  Clear paths of anything that can make you trip, such as tools or rocks.  Have leaves, snow, or ice cleared regularly.  Use sand or salt on paths during winter.  Clean up any spills in your garage right away. This includes grease or oil spills. What other actions can I take?  Wear shoes that: ? Have a low heel. Do not wear high heels. ? Have rubber bottoms. ? Feel good on your feet and fit well. ? Are closed at the toe. Do not wear open-toe sandals.  Use tools that help you move around if needed. These include: ? Canes. ? Walkers. ? Scooters. ? Crutches.  Review your medicines with your doctor. Some  medicines can make you feel dizzy. This can increase your chance of falling. Ask your doctor what else you can do to help prevent falls. Where to find more information  Centers for Disease Control and Prevention, STEADI: http://www.wolf.info/  National Institute on Aging: http://kim-miller.com/ Contact a doctor if:  You are afraid of falling at home.  You feel weak, drowsy, or dizzy at home.  You fall at home. Summary  There are many simple things that you can do to make your home safe and to help prevent falls.  Ways to make your home safe include removing things that can make you trip and installing grab bars in the bathroom.  Ask for help when making these changes in your home. This information is not intended to replace advice given to you by your health care provider. Make sure you discuss any questions you have with your health care provider. Document Revised: 04/30/2020 Document Reviewed: 04/30/2020 Elsevier Patient Education  [place].

## 2020-12-02 ENCOUNTER — Ambulatory Visit
Admission: RE | Admit: 2020-12-02 | Discharge: 2020-12-02 | Disposition: A | Discharge status: Home / Self Care | Payer: Medicare Other | Attending: Family Medicine | Admitting: Family Medicine

## 2020-12-02 ENCOUNTER — Ambulatory Visit
Admission: RE | Admit: 2020-12-02 | Discharge: 2020-12-02 | Disposition: A | Discharge status: Home / Self Care | Payer: Medicare Other | Source: Ambulatory Visit | Attending: Family Medicine | Admitting: Family Medicine

## 2020-12-02 ENCOUNTER — Ambulatory Visit: Payer: Self-pay

## 2020-12-02 DIAGNOSIS — Z13220 Encounter for screening for lipoid disorders: Secondary | ICD-10-CM | POA: Diagnosis not present

## 2020-12-02 DIAGNOSIS — E119 Type 2 diabetes mellitus without complications: Secondary | ICD-10-CM | POA: Diagnosis not present

## 2020-12-02 DIAGNOSIS — S299XXA Unspecified injury of thorax, initial encounter: Secondary | ICD-10-CM

## 2020-12-02 DIAGNOSIS — R0781 Pleurodynia: Secondary | ICD-10-CM | POA: Diagnosis not present

## 2020-12-02 DIAGNOSIS — M791 Myalgia, unspecified site: Secondary | ICD-10-CM | POA: Diagnosis not present

## 2020-12-02 NOTE — Chronic Care Management (AMB) (Signed)
  Chronic Care Management   Outreach Note  12/02/2020 Name: Anna Williams MRN: [id number] DOB: 03/19/1946  Primary Care Provider: Jerrol Banana., MD Reason for referral : Chronic Care Management   Anna Williams was referred to the care management team for assistance with chronic care management and care coordination. Her primary care provider will be notified of our unsuccessful attempts to maintain contact. The care management team will gladly outreach at any time in the future if she is interested in receiving assistance.    PLAN The care management team will gladly follow up with Anna Williams after the primary care provider has a conversation with her regarding recommendation for care management engagement and subsequent re-referral for care management services.    Cristy [facility] Care Management [facility] [phone]

## 2020-12-03 LAB — COMPREHENSIVE METABOLIC PANEL
ALT: 45 IU/L — ABNORMAL HIGH (ref 0–32)
AST: 55 IU/L — ABNORMAL HIGH (ref 0–40)
Albumin/Globulin Ratio: 1.8 (ref 1.2–2.2)
Albumin: 4.2 g/dL (ref 3.7–4.7)
Alkaline Phosphatase: 182 IU/L — ABNORMAL HIGH (ref 44–121)
BUN/Creatinine Ratio: 9 — ABNORMAL LOW (ref 12–28)
BUN: 7 mg/dL — ABNORMAL LOW (ref 8–27)
Bilirubin Total: 1.1 mg/dL (ref 0.0–1.2)
CO2: 24 mmol/L (ref 20–29)
Calcium: 9.5 mg/dL (ref 8.7–10.3)
Chloride: 100 mmol/L (ref 96–106)
Creatinine, Ser: 0.75 mg/dL (ref 0.57–1.00)
GFR calc Af Amer: 90 mL/min/{1.73_m2} (ref >59)
GFR calc non Af Amer: 78 mL/min/{1.73_m2} (ref >59)
Globulin, Total: 2.4 g/dL (ref 1.5–4.5)
Glucose: 394 mg/dL — ABNORMAL HIGH (ref 65–99)
Potassium: 4 mmol/L (ref 3.5–5.2)
Sodium: 140 mmol/L (ref 134–144)
Total Protein: 6.6 g/dL (ref 6.0–8.5)

## 2020-12-03 LAB — CBC WITH DIFFERENTIAL/PLATELET
Basophils Absolute: 0 10*3/uL (ref 0.0–0.2)
Basos: 0 %
EOS (ABSOLUTE): 0.1 10*3/uL (ref 0.0–0.4)
Eos: 1 %
Hematocrit: 40.6 % (ref 34.0–46.6)
Hemoglobin: 13.6 g/dL (ref 11.1–15.9)
Immature Grans (Abs): 0 10*3/uL (ref 0.0–0.1)
Immature Granulocytes: 0 %
Lymphocytes Absolute: 1.2 10*3/uL (ref 0.7–3.1)
Lymphs: 24 %
MCH: 30.8 pg (ref 26.6–33.0)
MCHC: 33.5 g/dL (ref 31.5–35.7)
MCV: 92 fL (ref 79–97)
Monocytes Absolute: 0.4 10*3/uL (ref 0.1–0.9)
Monocytes: 8 %
Neutrophils Absolute: 3.3 10*3/uL (ref 1.4–7.0)
Neutrophils: 67 %
Platelets: 176 10*3/uL (ref 150–450)
RBC: 4.42 x10E6/uL (ref 3.77–5.28)
RDW: 12.3 % (ref 11.7–15.4)
WBC: 5.1 10*3/uL (ref 3.4–10.8)

## 2020-12-03 LAB — SEDIMENTATION RATE: Sed Rate: 13 mm/hr (ref 0–40)

## 2020-12-03 LAB — LIPID PANEL
Chol/HDL Ratio: 1.6 ratio (ref 0.0–4.4)
Cholesterol, Total: 77 mg/dL — ABNORMAL LOW (ref 100–199)
HDL: 47 mg/dL (ref >39)
LDL Chol Calc (NIH): 16 mg/dL (ref 0–99)
Triglycerides: 58 mg/dL (ref 0–149)
VLDL Cholesterol Cal: 14 mg/dL (ref 5–40)

## 2020-12-03 LAB — HEMOGLOBIN A1C
Est. average glucose Bld gHb Est-mCnc: 306 mg/dL
Hgb A1c MFr Bld: 12.3 % — ABNORMAL HIGH (ref 4.8–5.6)

## 2020-12-03 LAB — CK: Total CK: 47 U/L (ref 32–182)

## 2020-12-04 ENCOUNTER — Other Ambulatory Visit: Payer: Self-pay | Admitting: *Deleted

## 2020-12-04 ENCOUNTER — Encounter: Payer: Self-pay | Admitting: Family Medicine

## 2020-12-04 DIAGNOSIS — G3184 Mild cognitive impairment, so stated: Secondary | ICD-10-CM

## 2020-12-04 DIAGNOSIS — I639 Cerebral infarction, unspecified: Secondary | ICD-10-CM

## 2020-12-05 ENCOUNTER — Other Ambulatory Visit: Payer: Self-pay | Admitting: *Deleted

## 2020-12-05 DIAGNOSIS — E119 Type 2 diabetes mellitus without complications: Secondary | ICD-10-CM

## 2020-12-05 MED ORDER — PIOGLITAZONE HCL 45 MG PO TABS: 45.0000 mg | ORAL_TABLET | Freq: Every day | ORAL | 3 refills | Status: DC

## 2020-12-05 NOTE — Progress Notes (Signed)
Patient's husband was advised. Rx sent to pharmacy.

## 2020-12-20 ENCOUNTER — Other Ambulatory Visit: Payer: Self-pay | Admitting: Family Medicine

## 2020-12-20 DIAGNOSIS — I10 Essential (primary) hypertension: Secondary | ICD-10-CM

## 2020-12-21 ENCOUNTER — Encounter: Payer: Self-pay | Admitting: Family Medicine

## 2020-12-27 ENCOUNTER — Encounter: Payer: Self-pay | Admitting: Family Medicine

## 2020-12-27 DIAGNOSIS — E119 Type 2 diabetes mellitus without complications: Secondary | ICD-10-CM

## 2020-12-29 ENCOUNTER — Encounter: Payer: Self-pay | Admitting: Family Medicine

## 2020-12-29 MED ORDER — TOUJEO SOLOSTAR 300 UNIT/ML ~~LOC~~ SOPN: 5.0000 [IU] | PEN_INJECTOR | Freq: Every day | SUBCUTANEOUS | 3 refills | Status: DC

## 2020-12-31 NOTE — Progress Notes (Signed)
Subjective:   Anna Williams is a 75 y.o. female who presents for Medicare Annual (Subsequent) preventive examination.  I connected with Harrel Lemon today by telephone and verified that I am speaking with the correct person using two identifiers. Location patient: home Location provider: work Persons participating in the virtual visit: patient, provider.   I discussed the limitations, risks, security and privacy concerns of performing an evaluation and management service by telephone and the availability of in person appointments. I also discussed with the patient that there may be a patient responsible charge related to this service. The patient expressed understanding and verbally consented to this telephonic visit.    Interactive audio and video telecommunications were attempted between this provider and patient, however failed, due to patient having technical difficulties OR patient did not have access to video capability.  We continued and completed visit with audio only.    Review of Systems    N/A  Cardiac Risk Factors include: dyslipidemia;diabetes mellitus;hypertension;advanced age (>43men, >4 women)     Objective:    There were no vitals filed for this visit. There is no height or weight on file to calculate BMI.  Advanced Directives 01/01/2021 03/10/2020 03/10/2020 07/09/2019 12/15/2017 09/13/2016 05/19/2015  Does Patient Have a Medical Advance Directive? Yes No No No Yes No No  Type of Paramedic of [place];Living will - - - Living will - -  Copy of [place] in Chart? No - copy requested - - - - - -  Would patient like information on creating a medical advance directive? - No - Patient declined - Yes (MAU/Ambulatory/Procedural Areas - Information given) - - No - patient declined information    Current Medications (verified) Outpatient Encounter Medications as of 01/01/2021  Medication Sig  . amLODipine (NORVASC) 5 MG tablet  TAKE 1 TABLET BY MOUTH EVERY DAY  . Apoaequorin (PREVAGEN) 10 MG CAPS Take 10 mg by mouth daily at 12 noon.  (Patient not taking: No sig reported)  . atorvastatin (LIPITOR) 40 MG tablet TAKE 1 TABLET BY MOUTH EVERY DAY  . buPROPion (WELLBUTRIN SR) 100 MG 12 hr tablet TAKE 1 TABLET BY MOUTH TWICE A DAY (Patient not taking: No sig reported)  . Calcium Citrate-Vitamin D (CALCIUM + D PO) Take 1 tablet by mouth daily.  . cholestyramine (QUESTRAN) 4 g packet TAKE 1 PACKET (4 G TOTAL) BY MOUTH 2 (TWO) TIMES DAILY WITH A MEAL.  . cholestyramine (QUESTRAN) 4 GM/DOSE powder Take 1 packet (4 g total) by mouth 2 (two) times daily with a meal.  . glucose blood (ONE TOUCH ULTRA TEST) test strip CHECK SUGAR ONCE DAILY (Patient not taking: Reported on 12/01/2020)  . insulin glargine, 1 Unit Dial, (TOUJEO SOLOSTAR) 300 UNIT/ML Solostar Pen Inject 5 Units into the skin daily.  Marland Kitchen lidocaine (LIDODERM) 5 % APPLY PATCH TO AFFECTED AREA WHEN AWAKE. (Patient not taking: Reported on 12/01/2020)  . metFORMIN (GLUCOPHAGE-XR) 500 MG 24 hr tablet TAKE 1 TABLET BY MOUTH EVERY DAY WITH BREAKFAST (Patient not taking: Reported on 12/01/2020)  . mirtazapine (REMERON) 30 MG tablet TAKE 1 TABLET BY MOUTH AT BEDTIME.  . Multiple Vitamins-Minerals (PRESERVISION/LUTEIN PO) Take by mouth daily.   Marland Kitchen omeprazole (PRILOSEC) 20 MG capsule Take 1 capsule (20 mg total) by mouth 2 (two) times daily before a meal.  . ONETOUCH DELICA LANCETS FINE MISC Check sugar once daily  . pioglitazone (ACTOS) 45 MG tablet Take 1 tablet (45 mg total) by mouth daily.  Marland Kitchen  sertraline (ZOLOFT) 50 MG tablet TAKE 1 TABLET BY MOUTH EVERY DAY   No facility-administered encounter medications on file as of 01/01/2021.    Allergies (verified) Codeine and Lmx 4 [lidocaine]   History: Past Medical History:  Diagnosis Date  . Anxiety and depression   . ASCUS with positive high risk HPV 09/2013  . Diabetes mellitus without complication ([place])    type 2  . Elevated  blood sugar   . Family history of breast cancer in mother   . Fibroid uterus   . History of pulmonary embolus (PE)   . Hypertension   . Menopause   . Mild dysplasia of cervix 09/2013   repeat pap done -ecc neg, cerival bx cin 1  . Retinal detachment    Past Surgical History:  Procedure Laterality Date  . anterior neck fusion    . BREAST CYST ASPIRATION Left yrs ago  . CHOLECYSTECTOMY  2010  . DILATION AND CURETTAGE OF UTERUS    . HYSTEROSCOPY    . mole removed  2014   Family History  Problem Relation Age of Onset  . Breast cancer Mother 27       reccurence age 24  . Heart disease Father   . Throat cancer Brother   . Heart disease Brother   . Lung cancer Brother        with mets to brain  . Breast cancer Maternal Aunt   . Colon cancer Neg Hx   . Ovarian cancer Neg Hx    Social History   Socioeconomic History  . Marital status: Married    Spouse name: Not on file  . Number of children: 2  . Years of education: Not on file  . Highest education level: Associate degree: occupational, Hotel manager, or vocational program  Occupational History  . Occupation: retired  Tobacco Use  . Smoking status: Never Smoker  . Smokeless tobacco: Never Used  Vaping Use  . Vaping Use: Never used  Substance and Sexual Activity  . Alcohol use: No  . Drug use: No  . Sexual activity: Not Currently    Birth control/protection: Post-menopausal  Other Topics Concern  . Not on file  Social History Narrative  . Not on file   Social Determinants of Health   Financial Resource Strain: Low Risk   . Difficulty of Paying Living Expenses: Not hard at all  Food Insecurity: No Food Insecurity  . Worried About Charity fundraiser in the Last Year: Never true  . Ran Out of Food in the Last Year: Never true  Transportation Needs: No Transportation Needs  . Lack of Transportation (Medical): No  . Lack of Transportation (Non-Medical): No  Physical Activity: Inactive  . Days of Exercise per Week: 0  days  . Minutes of Exercise per Session: 0 min  Stress: No Stress Concern Present  . Feeling of Stress : Only a little  Social Connections: Socially Integrated  . Frequency of Communication with Friends and Family: More than three times a week  . Frequency of Social Gatherings with Friends and Family: More than three times a week  . Attends Religious Services: More than 4 times per year  . Active Member of Clubs or Organizations: Yes  . Attends Archivist Meetings: More than 4 times per year  . Marital Status: Married    Tobacco Counseling Counseling given: Not Answered   Clinical Intake:  Pre-visit preparation completed: Yes  Pain : No/denies pain     Nutritional Risks:  Nausea/ vomitting/ diarrhea (Diarrhea occasionally due to Metformin) Diabetes: Yes  How often do you need to have someone help you when you read instructions, pamphlets, or other written materials from your doctor or pharmacy?: 1 - Never  Diabetic? Yes  Nutrition Risk Assessment:  Has the patient had any N/V/D within the last 2 months?  No  Does the patient have any non-healing wounds?  No  Has the patient had any unintentional weight loss or weight gain?  No   Diabetes:  Is the patient diabetic?  Yes  If diabetic, was a CBG obtained today?  No  Did the patient bring in their glucometer from home?  No  How often do you monitor your CBG's? Once a day in the morning.   Financial Strains and Diabetes Management:  Are you having any financial strains with the device, your supplies or your medication? No .  Does the patient want to be seen by Chronic Care Management for management of their diabetes?  No  Would the patient like to be referred to a Nutritionist or for Diabetic Management?  No   Diabetic Exams:  Diabetic Eye Exam: Overdue for diabetic eye exam. Pt has been advised about the importance in completing this exam. Patient advised to call and schedule an eye exam. Diabetic Foot Exam:  Overdue, Pt has been advised about the importance in completing this exam.    Interpreter Needed?: No  Information entered by :: [facility], LPN   Activities of Daily Living In your present state of health, do you have any difficulty performing the following activities: 01/01/2021 06/30/2020  Hearing? N N  Vision? N N  Difficulty concentrating or making decisions? N N  Walking or climbing stairs? N N  Dressing or bathing? N N  Doing errands, shopping? N N  Preparing Food and eating ? N -  Using the Toilet? N -  In the past six months, have you accidently leaked urine? Y -  Comment Wears depends at night. -  Do you have problems with loss of bowel control? Y -  Comment Wears depends at night. -  Managing your Medications? N -  Managing your Finances? N -  Housekeeping or managing your Housekeeping? N -  Some recent data might be hidden    Patient Care Team: Jerrol Banana., MD as PCP - General (Family Medicine) Lorelee Cover., MD as Consulting Physician (Ophthalmology) Hayden Pedro, MD as Consulting Physician (Ophthalmology) Rubie Maid, MD as Referring Physician (Obstetrics and Gynecology)  Indicate any recent Medical Services you may have received from other than [facility] providers in the past year (date may be approximate).     Assessment:   This is a routine wellness examination for [place].  Hearing/Vision screen No exam data present  Dietary issues and exercise activities discussed: Current Exercise Habits: The patient does not participate in regular exercise at present, Exercise limited by: None identified  Goals    . DIET - INCREASE WATER INTAKE     Recommend to continue drinking 6-8 glasses of water a day.     . Exercise 3x per week (30 min per time)     Recommend to exercise for 3 days a week for at least 30 minutes at a time.     . Prevent falls     Recommend to remove any items from the home that may cause slips or trips.      Depression  Screen [facility] 2/9 Scores 01/01/2021 09/17/2020 06/30/2020 06/30/2020 03/06/2020  07/09/2019 12/11/2018  PHQ - 2 Score 0 0 4 2 2 1  0  PHQ- 9 Score - 1 17 16 8  - -    Fall Risk Fall Risk  01/01/2021 07/09/2019 12/11/2018 12/15/2017 09/13/2017  Falls in the past year? 1 0 0 No No  Number falls in past yr: 0 0 - - -  Injury with Fall? 0 0 - - -  Follow up Falls prevention discussed - - - -    FALL RISK PREVENTION PERTAINING TO THE HOME:  Any stairs in or around the home? Yes  If so, are there any without handrails? No  Home free of loose throw rugs in walkways, pet beds, electrical cords, etc? Yes  Adequate lighting in your home to reduce risk of falls? Yes   ASSISTIVE DEVICES UTILIZED TO PREVENT FALLS:  Life alert? No  Use of a cane, walker or w/c? No  Grab bars in the bathroom? Yes  Shower chair or bench in shower? Yes  Elevated toilet seat or a handicapped toilet? Yes     Cognitive Function: MMSE - Mini Mental State Exam 06/30/2020  Orientation to time 4  Orientation to Place 5  Registration 3  Attention/ Calculation 3  Recall 3  Language- name 2 objects 2  Language- repeat 1  Language- follow 3 step command 3  Language- read & follow direction 1  Write a sentence 1  Copy design 1  Total score 27     6CIT Screen 09/13/2016  What Year? 0 points  What month? 0 points  What time? 0 points  Count back from 20 0 points  Months in reverse 0 points  Repeat phrase 0 points  Total Score 0    Immunizations Immunization History  Administered Date(s) Administered  . Fluad Quad(high Dose 65+) 09/17/2020  . Influenza, High Dose Seasonal PF 09/03/2015, 06/29/2016, 09/13/2017, 08/28/2018  . PFIZER(Purple Top)SARS-COV-2 Vaccination 11/14/2019, 12/05/2019, 07/10/2020  . Pneumococcal Conjugate-13 09/03/2015  . Pneumococcal Polysaccharide-23 09/13/2017  . Zoster Recombinat (Shingrix) 07/02/2019, 04/12/2020    TDAP status: Due, Education has been provided regarding the importance of this  vaccine. Advised may receive this vaccine at local pharmacy or Health Dept. Aware to provide a copy of the vaccination record if obtained from local pharmacy or Health Dept. Verbalized acceptance and understanding.  Flu Vaccine status: Up to date  Pneumococcal vaccine status: Up to date  Covid-19 vaccine status: Completed vaccines  Qualifies for Shingles Vaccine? Yes   Zostavax completed No   Shingrix Completed?: Yes  Screening Tests Health Maintenance  Topic Date Due  . Hepatitis C Screening  Never done  . FOOT EXAM  05/25/2018  . OPHTHALMOLOGY EXAM  03/03/2019  . TETANUS/TDAP  01/01/2022 (Originally 06/03/1965)  . URINE MICROALBUMIN  03/31/2021  . HEMOGLOBIN A1C  06/01/2021  . COLONOSCOPY (Pts 45-21yrs Insurance coverage will need to be confirmed)  06/25/2024  . INFLUENZA VACCINE  Completed  . DEXA SCAN  Completed  . COVID-19 Vaccine  Completed  . PNA vac Low Risk Adult  Completed  . HPV VACCINES  Aged Out    Health Maintenance  Health Maintenance Due  Topic Date Due  . Hepatitis C Screening  Never done  . FOOT EXAM  05/25/2018  . OPHTHALMOLOGY EXAM  03/03/2019    Colorectal cancer screening: Type of screening: Colonoscopy. Completed 06/25/14. Repeat every 10 years  Mammogram status: No longer required due to age.  Bone Density status: Completed 07/18/18. Previous DEXA scan was normal. No repeat needed unless  advised by a physician.  Lung Cancer Screening: (Low Dose CT Chest recommended if Age 59-80 years, 30 pack-year currently smoking OR have quit w/in 15years.) does not qualify.   Additional Screening:  Hepatitis C Screening: does qualify and would like this added to next blood work orders.  Vision Screening: Recommended annual ophthalmology exams for early detection of glaucoma and other disorders of the eye. Is the patient up to date with their annual eye exam?  Yes  Who is the provider or what is the name of the office in which the patient attends annual eye  exams? Dr Gloriann Loan If pt is not established with a provider, would they like to be referred to a provider to establish care? No .   Dental Screening: Recommended annual dental exams for proper oral hygiene  Community Resource Referral / Chronic Care Management: CRR required this visit?  No   CCM required this visit?  No      Plan:     I have personally reviewed and noted the following in the patient's chart:   . Medical and social history . Use of alcohol, tobacco or illicit drugs  . Current medications and supplements . Functional ability and status . Nutritional status . Physical activity . Advanced directives . List of other physicians . Hospitalizations, surgeries, and ER visits in previous 12 months . Vitals . Screenings to include cognitive, depression, and falls . Referrals and appointments  In addition, I have reviewed and discussed with patient certain preventive protocols, quality metrics, and best practice recommendations. A written personalized care plan for preventive services as well as general preventive health recommendations were provided to patient.     Camellia Popescu [place], [place]   02/26/8415   Nurse Notes: Pt needs a diabetic foot exam and Hep C lab order at next in office apt. Pt plans to call and schedule an eye exam this year.

## 2021-01-01 ENCOUNTER — Other Ambulatory Visit: Payer: Self-pay

## 2021-01-01 ENCOUNTER — Ambulatory Visit (INDEPENDENT_AMBULATORY_CARE_PROVIDER_SITE_OTHER): Payer: Medicare Other

## 2021-01-01 DIAGNOSIS — Z0000 Encounter for general adult medical examination without abnormal findings: Secondary | ICD-10-CM | POA: Diagnosis not present

## 2021-01-01 NOTE — Patient Instructions (Signed)
Anna Williams , Thank you for taking time to come for your Medicare Wellness Visit. I appreciate your ongoing commitment to your health goals. Please review the following plan we discussed and let me know if I can assist you in the future.   Screening recommendations/referrals: Colonoscopy: Up to date, due 06/2024 Mammogram: No longer required.  Bone Density: Previous DEXA scan was normal. No repeat needed unless advised by a physician. Recommended yearly ophthalmology/optometry visit for glaucoma screening and checkup Recommended yearly dental visit for hygiene and checkup  Vaccinations: Influenza vaccine: Done 09/17/21 Pneumococcal vaccine: Completed series Tdap vaccine: Currently due, declined receiving.  Shingles vaccine: Completed series    Advanced directives: Please bring a copy of your POA (Power of Attorney) and/or Living Will to your next appointment.   Conditions/risks identified: Fall risk preventatives discussed today. Recommend to start exercising 3 days a week for at least 30 mi  Next appointment: 03/02/21 @ 10:40 AM with Dr Rosanna Randy. Declined scheduling an AWV for 2023 at this time.    Preventive Care 17 Years and Older, Female Preventive care refers to lifestyle choices and visits with your health care provider that can promote health and wellness. What does preventive care include?  A yearly physical exam. This is also called an annual well check.  Dental exams once or twice a year.  Routine eye exams. Ask your health care provider how often you should have your eyes checked.  Personal lifestyle choices, including:  Daily care of your teeth and gums.  Regular physical activity.  Eating a healthy diet.  Avoiding tobacco and drug use.  Limiting alcohol use.  Practicing safe sex.  Taking low-dose aspirin every day.  Taking vitamin and mineral supplements as recommended by your health care provider. What happens during an annual well check? The services and  screenings done by your health care provider during your annual well check will depend on your age, overall health, lifestyle risk factors, and family history of disease. Counseling  Your health care provider may ask you questions about your:  Alcohol use.  Tobacco use.  Drug use.  Emotional well-being.  Home and relationship well-being.  Sexual activity.  Eating habits.  History of falls.  Memory and ability to understand (cognition).  Work and work Statistician.  Reproductive health. Screening  You may have the following tests or measurements:  Height, weight, and BMI.  Blood pressure.  Lipid and cholesterol levels. These may be checked every 5 years, or more frequently if you are over 63 years old.  Skin check.  Lung cancer screening. You may have this screening every year starting at age 53 if you have a 30-pack-year history of smoking and currently smoke or have quit within the past 15 years.  Fecal occult blood test (FOBT) of the stool. You may have this test every year starting at age 78.  Flexible sigmoidoscopy or colonoscopy. You may have a sigmoidoscopy every 5 years or a colonoscopy every 10 years starting at age 41.  Hepatitis C blood test.  Hepatitis B blood test.  Sexually transmitted disease (STD) testing.  Diabetes screening. This is done by checking your blood sugar (glucose) after you have not eaten for a while (fasting). You may have this done every 1-3 years.  Bone density scan. This is done to screen for osteoporosis. You may have this done starting at age 39.  Mammogram. This may be done every 1-2 years. Talk to your health care provider about how often you should have regular  mammograms. Talk with your health care provider about your test results, treatment options, and if necessary, the need for more tests. Vaccines  Your health care provider may recommend certain vaccines, such as:  Influenza vaccine. This is recommended every  year.  Tetanus, diphtheria, and acellular pertussis (Tdap, Td) vaccine. You may need a Td booster every 10 years.  Zoster vaccine. You may need this after age 85.  Pneumococcal 13-valent conjugate (PCV13) vaccine. One dose is recommended after age 48.  Pneumococcal polysaccharide (PPSV23) vaccine. One dose is recommended after age 40. Talk to your health care provider about which screenings and vaccines you need and how often you need them. This information is not intended to replace advice given to you by your health care provider. Make sure you discuss any questions you have with your health care provider. Document Released: 10/24/2015 Document Revised: 06/16/2016 Document Reviewed: 07/29/2015 Elsevier Interactive Patient Education  2017 [place] Prevention in the Home Falls can cause injuries. They can happen to people of all ages. There are many things you can do to make your home safe and to help prevent falls. What can I do on the outside of my home?  Regularly fix the edges of walkways and driveways and fix any cracks.  Remove anything that might make you trip as you walk through a door, such as a raised step or threshold.  Trim any bushes or trees on the path to your home.  Use bright outdoor lighting.  Clear any walking paths of anything that might make someone trip, such as rocks or tools.  Regularly check to see if handrails are loose or broken. Make sure that both sides of any steps have handrails.  Any raised decks and porches should have guardrails on the edges.  Have any leaves, snow, or ice cleared regularly.  Use sand or salt on walking paths during winter.  Clean up any spills in your garage right away. This includes oil or grease spills. What can I do in the bathroom?  Use night lights.  Install grab bars by the toilet and in the tub and shower. Do not use towel bars as grab bars.  Use non-skid mats or decals in the tub or shower.  If you  need to sit down in the shower, use a plastic, non-slip stool.  Keep the floor dry. Clean up any water that spills on the floor as soon as it happens.  Remove soap buildup in the tub or shower regularly.  Attach bath mats securely with double-sided non-slip rug tape.  Do not have throw rugs and other things on the floor that can make you trip. What can I do in the bedroom?  Use night lights.  Make sure that you have a light by your bed that is easy to reach.  Do not use any sheets or blankets that are too big for your bed. They should not hang down onto the floor.  Have a firm chair that has side arms. You can use this for support while you get dressed.  Do not have throw rugs and other things on the floor that can make you trip. What can I do in the kitchen?  Clean up any spills right away.  Avoid walking on wet floors.  Keep items that you use a lot in easy-to-reach places.  If you need to reach something above you, use a strong step stool that has a grab bar.  Keep electrical cords out of the way.  Do not use floor polish or wax that makes floors slippery. If you must use wax, use non-skid floor wax.  Do not have throw rugs and other things on the floor that can make you trip. What can I do with my stairs?  Do not leave any items on the stairs.  Make sure that there are handrails on both sides of the stairs and use them. Fix handrails that are broken or loose. Make sure that handrails are as long as the stairways.  Check any carpeting to make sure that it is firmly attached to the stairs. Fix any carpet that is loose or worn.  Avoid having throw rugs at the top or bottom of the stairs. If you do have throw rugs, attach them to the floor with carpet tape.  Make sure that you have a light switch at the top of the stairs and the bottom of the stairs. If you do not have them, ask someone to add them for you. What else can I do to help prevent falls?  Wear shoes  that:  Do not have high heels.  Have rubber bottoms.  Are comfortable and fit you well.  Are closed at the toe. Do not wear sandals.  If you use a stepladder:  Make sure that it is fully opened. Do not climb a closed stepladder.  Make sure that both sides of the stepladder are locked into place.  Ask someone to hold it for you, if possible.  Clearly mark and make sure that you can see:  Any grab bars or handrails.  First and last steps.  Where the edge of each step is.  Use tools that help you move around (mobility aids) if they are needed. These include:  Canes.  Walkers.  Scooters.  Crutches.  Turn on the lights when you go into a dark area. Replace any light bulbs as soon as they burn out.  Set up your furniture so you have a clear path. Avoid moving your furniture around.  If any of your floors are uneven, fix them.  If there are any pets around you, be aware of where they are.  Review your medicines with your doctor. Some medicines can make you feel dizzy. This can increase your chance of falling. Ask your doctor what other things that you can do to help prevent falls. This information is not intended to replace advice given to you by your health care provider. Make sure you discuss any questions you have with your health care provider. Document Released: 07/24/2009 Document Revised: 03/04/2016 Document Reviewed: 11/01/2014 Elsevier Interactive Patient Education  2017 [employer].

## 2021-01-02 ENCOUNTER — Other Ambulatory Visit: Payer: Self-pay | Admitting: Family Medicine

## 2021-01-02 DIAGNOSIS — K219 Gastro-esophageal reflux disease without esophagitis: Secondary | ICD-10-CM

## 2021-01-06 DIAGNOSIS — E113393 Type 2 diabetes mellitus with moderate nonproliferative diabetic retinopathy without macular edema, bilateral: Secondary | ICD-10-CM | POA: Diagnosis not present

## 2021-01-06 LAB — HM DIABETES EYE EXAM

## 2021-02-05 ENCOUNTER — Encounter: Payer: Self-pay | Admitting: Physician Assistant

## 2021-02-05 ENCOUNTER — Other Ambulatory Visit: Payer: Self-pay

## 2021-02-05 ENCOUNTER — Encounter: Payer: Self-pay | Admitting: Family Medicine

## 2021-02-05 ENCOUNTER — Other Ambulatory Visit: Payer: Self-pay | Admitting: *Deleted

## 2021-02-05 ENCOUNTER — Ambulatory Visit (INDEPENDENT_AMBULATORY_CARE_PROVIDER_SITE_OTHER): Payer: Medicare Other | Admitting: Family Medicine

## 2021-02-05 VITALS — BP 128/72 | HR 72 | Temp 98.0°F | Resp 16 | Ht 67.0 in | Wt 141.0 lb

## 2021-02-05 DIAGNOSIS — E119 Type 2 diabetes mellitus without complications: Secondary | ICD-10-CM | POA: Diagnosis not present

## 2021-02-05 DIAGNOSIS — I10 Essential (primary) hypertension: Secondary | ICD-10-CM

## 2021-02-05 DIAGNOSIS — R350 Frequency of micturition: Secondary | ICD-10-CM

## 2021-02-05 DIAGNOSIS — I639 Cerebral infarction, unspecified: Secondary | ICD-10-CM

## 2021-02-05 DIAGNOSIS — G3184 Mild cognitive impairment, so stated: Secondary | ICD-10-CM | POA: Diagnosis not present

## 2021-02-05 LAB — POCT URINALYSIS DIPSTICK
Bilirubin, UA: NEGATIVE
Blood, UA: NEGATIVE
Glucose, UA: POSITIVE — AB
Ketones, UA: NEGATIVE
Leukocytes, UA: NEGATIVE
Nitrite, UA: POSITIVE
Protein, UA: NEGATIVE
Spec Grav, UA: 1.01 (ref 1.010–1.025)
Urobilinogen, UA: 0.2 E.U./dL
pH, UA: 6 (ref 5.0–8.0)

## 2021-02-05 MED ORDER — GLUCOSE BLOOD VI STRP: ORAL_STRIP | 11 refills | Status: DC

## 2021-02-05 NOTE — Progress Notes (Signed)
I,[name],acting as a scribe for Wilhemena Durie, MD.,have documented all relevant documentation on the behalf of Wilhemena Durie, MD,as directed by  Wilhemena Durie, MD while in the presence of Wilhemena Durie, MD.   Established patient visit   Patient: Anna Williams   DOB: 01/16/1946   74 y.o. Female  MRN: [id number] Visit Date: 02/05/2021  Today's healthcare provider: Wilhemena Durie, MD   No chief complaint on file.  Subjective    HPI  Patient comes in today for urinary frequency and pedal and ankle edema.  These are fairly new issues to her.  Overall she states she is feeling better.  Her husband is not with her today.  Her last A1c was 12.3 but is a little early to check that today.  She states her blood sugars are doing better. Diabetes Mellitus Type II, follow-up  Lab Results  Component Value Date   HGBA1C 12.3 (H) 12/02/2020   HGBA1C 8.2 (A) 06/30/2020   HGBA1C 7.4 (H) 03/10/2020   Last seen for diabetes 2 months ago.  Management since then includes; labs checked showing-stable but diabetes control very poor.  She reports good compliance with treatment. Increased the pioglitazone from 30 to 45 mg daily. May have to add another diabetes medication on the next visit She is not having side effects. none  Home blood sugar records: fasting range: not checking  Episodes of hypoglycemia? No none   Current insulin regiment: TOUJEO  Most Recent Eye Exam: due  -------------------------------------------------------------------       Medications: Outpatient Medications Prior to Visit  Medication Sig  . amLODipine (NORVASC) 5 MG tablet TAKE 1 TABLET BY MOUTH EVERY DAY  . insulin glargine, 1 Unit Dial, (TOUJEO SOLOSTAR) 300 UNIT/ML Solostar Pen Inject 5 Units into the skin daily.  Marland Kitchen lidocaine (LIDODERM) 5 % APPLY PATCH TO AFFECTED AREA WHEN AWAKE.  . mirtazapine (REMERON) 30 MG tablet TAKE 1 TABLET BY MOUTH AT BEDTIME.  . Multiple  Vitamins-Minerals (PRESERVISION/LUTEIN PO) Take by mouth daily.   Marland Kitchen omeprazole (PRILOSEC) 20 MG capsule Take 1 capsule (20 mg total) by mouth 2 (two) times daily before a meal.  . ONETOUCH DELICA LANCETS FINE MISC Check sugar once daily  . pioglitazone (ACTOS) 45 MG tablet Take 1 tablet (45 mg total) by mouth daily.  . sertraline (ZOLOFT) 50 MG tablet TAKE 1 TABLET BY MOUTH EVERY DAY  . Apoaequorin (PREVAGEN) 10 MG CAPS Take 10 mg by mouth daily at 12 noon.  (Patient not taking: No sig reported)  . atorvastatin (LIPITOR) 40 MG tablet TAKE 1 TABLET BY MOUTH EVERY DAY (Patient not taking: Reported on 02/05/2021)  . buPROPion (WELLBUTRIN SR) 100 MG 12 hr tablet TAKE 1 TABLET BY MOUTH TWICE A DAY (Patient not taking: No sig reported)  . Calcium Citrate-Vitamin D (CALCIUM + D PO) Take 1 tablet by mouth daily.  . cholestyramine (QUESTRAN) 4 g packet TAKE 1 PACKET (4 G TOTAL) BY MOUTH 2 (TWO) TIMES DAILY WITH A MEAL. (Patient not taking: Reported on 02/05/2021)  . cholestyramine (QUESTRAN) 4 GM/DOSE powder Take 1 packet (4 g total) by mouth 2 (two) times daily with a meal. (Patient not taking: Reported on 02/05/2021)  . metFORMIN (GLUCOPHAGE-XR) 500 MG 24 hr tablet TAKE 1 TABLET BY MOUTH EVERY DAY WITH BREAKFAST (Patient not taking: Reported on 02/05/2021)  . [DISCONTINUED] glucose blood (ONE TOUCH ULTRA TEST) test strip CHECK SUGAR ONCE DAILY (Patient not taking: Reported on 12/01/2020)  No facility-administered medications prior to visit.    Review of Systems  Constitutional: Negative for appetite change, chills, fatigue and fever.  Respiratory: Negative for chest tightness and shortness of breath.   Cardiovascular: Negative for chest pain and palpitations.  Gastrointestinal: Negative for abdominal pain, nausea and vomiting.  Neurological: Negative for dizziness and weakness.    Last hemoglobin A1c Lab Results  Component Value Date   HGBA1C 12.3 (H) 12/02/2020       Objective    BP 128/72 (BP  Location: Right Arm, Patient Position: Sitting, Cuff Size: Normal)   Pulse 72   Temp 98 F (36.7 C) (Oral)   Resp 16   Ht 5\' 7"  (1.702 m)   Wt 141 lb (64 kg)   SpO2 97%   BMI 22.08 kg/m  BP Readings from Last 3 Encounters:  02/05/21 128/72  12/01/20 130/63  09/17/20 117/71   Wt Readings from Last 3 Encounters:  02/05/21 141 lb (64 kg)  12/01/20 141 lb 6.4 oz (64.1 kg)  09/17/20 146 lb (66.2 kg)       Physical Exam Vitals reviewed.  Constitutional:      Appearance: Normal appearance. She is normal weight.  Eyes:     General: No scleral icterus.    Conjunctiva/sclera: Conjunctivae normal.  Cardiovascular:     Rate and Rhythm: Normal rate and regular rhythm.     Pulses: Normal pulses.     Heart sounds: Normal heart sounds.  Pulmonary:     Effort: Pulmonary effort is normal.     Breath sounds: Normal breath sounds.  Abdominal:     General: Bowel sounds are normal.     Palpations: Abdomen is soft.  Musculoskeletal:     Cervical back: Normal range of motion and neck supple.     Right lower leg: No edema.     Left lower leg: No edema.     Comments: Minimal pedal edema without any edema of above ankles.  Skin:    General: Skin is warm and dry.  Neurological:     General: No focal deficit present.     Mental Status: She is alert and oriented to person, place, and time.  Psychiatric:        Mood and Affect: Mood normal.        Behavior: Behavior normal.        Judgment: Judgment normal.     Comments:  patient is anxious today.       No results found for any visits on 02/05/21.  Assessment & Plan     1. Diabetes mellitus type 2 without retinopathy ([place]) Follow-up A1c in about a month.  Goal less than 8-8.5.  I am worried getting any tighter than that will be difficult as her cognition is not quite what it was - glucose blood (ONE TOUCH ULTRA TEST) test strip; CHECK SUGAR ONCE DAILY  Dispense: 100 each; Refill: 11  2. Urine frequency Obtain urine.  Consider  pelvic exam on next visit for atrophic changes - POCT urinalysis dipstick - CULTURE, URINE COMPREHENSIVE  3. Primary hypertension Good control.  4. MCI (mild cognitive impairment) MMSE on next visit.  5. Cerebrovascular accident (CVA), unspecified mechanism ([place]) I think between the stroke and percussion this started the cognitive impairment.  At least on the timeframe and clinically it seems to.  Follow-up in a month or 2.   No follow-ups on file.      I, Wilhemena Durie, MD, have reviewed all documentation for  this visit. The documentation on 02/10/21 for the exam, diagnosis, procedures, and orders are all accurate and complete.    Graiden Henes Cranford Mon, MD  [facility] [phone] (phone) [phone] (fax)  [place]

## 2021-02-11 ENCOUNTER — Telehealth: Payer: Self-pay

## 2021-02-11 DIAGNOSIS — R350 Frequency of micturition: Secondary | ICD-10-CM

## 2021-02-11 LAB — CULTURE, URINE COMPREHENSIVE

## 2021-02-11 MED ORDER — NITROFURANTOIN MONOHYD MACRO 100 MG PO CAPS: 100.0000 mg | ORAL_CAPSULE | Freq: Two times a day (BID) | ORAL | 0 refills | Status: AC

## 2021-02-11 NOTE — Telephone Encounter (Signed)
-----   Message from Jerrol Banana., MD sent at 02/11/2021 12:59 PM EDT ----- Probable UTI.  Treat with nitrofurantoin 100 mg twice a day for 5 days

## 2021-02-11 NOTE — Telephone Encounter (Signed)
Advised patient as below. Medication was sent into the pharmacy.  

## 2021-02-15 ENCOUNTER — Other Ambulatory Visit: Payer: Self-pay | Admitting: Family Medicine

## 2021-02-15 DIAGNOSIS — F419 Anxiety disorder, unspecified: Secondary | ICD-10-CM

## 2021-02-15 NOTE — Telephone Encounter (Signed)
Requested Prescriptions  Pending Prescriptions Disp Refills  . sertraline (ZOLOFT) 50 MG tablet [Pharmacy Med Name: SERTRALINE HCL 50 MG TABLET] 90 tablet 0    Sig: TAKE 1 TABLET BY MOUTH EVERY DAY     Psychiatry:  Antidepressants - SSRI Passed - 02/15/2021 12:49 AM      Passed - Valid encounter within last 6 months    Recent Outpatient Visits          1 week ago Diabetes mellitus type 2 without retinopathy [facility])   [facility] Jerrol Banana., MD   2 months ago Trauma of chest, initial encounter   [facility] Jerrol Banana., MD   3 months ago Diarrhea, unspecified type   [facility] Jerrol Banana., MD   4 months ago [place], [place], [place]   5 months ago [place] Jerrol Banana., MD      Future Appointments            In 2 weeks Jerrol Banana., MD [facility], [place]

## 2021-02-23 ENCOUNTER — Other Ambulatory Visit: Payer: Self-pay | Admitting: Family Medicine

## 2021-02-23 DIAGNOSIS — I10 Essential (primary) hypertension: Secondary | ICD-10-CM

## 2021-02-23 DIAGNOSIS — L570 Actinic keratosis: Secondary | ICD-10-CM | POA: Diagnosis not present

## 2021-02-23 NOTE — Telephone Encounter (Signed)
Future visit in 1 week  

## 2021-02-26 ENCOUNTER — Encounter: Payer: Medicare Other | Admitting: Obstetrics and Gynecology

## 2021-02-27 ENCOUNTER — Encounter: Payer: Self-pay | Admitting: Obstetrics and Gynecology

## 2021-03-02 ENCOUNTER — Other Ambulatory Visit: Payer: Self-pay

## 2021-03-02 ENCOUNTER — Ambulatory Visit (INDEPENDENT_AMBULATORY_CARE_PROVIDER_SITE_OTHER): Payer: Medicare Other | Admitting: Family Medicine

## 2021-03-02 ENCOUNTER — Encounter: Payer: Self-pay | Admitting: Family Medicine

## 2021-03-02 VITALS — BP 129/68 | HR 68 | Resp 15 | Wt 141.2 lb

## 2021-03-02 DIAGNOSIS — I639 Cerebral infarction, unspecified: Secondary | ICD-10-CM | POA: Diagnosis not present

## 2021-03-02 DIAGNOSIS — G3184 Mild cognitive impairment, so stated: Secondary | ICD-10-CM | POA: Diagnosis not present

## 2021-03-02 DIAGNOSIS — S060X0A Concussion without loss of consciousness, initial encounter: Secondary | ICD-10-CM | POA: Diagnosis not present

## 2021-03-02 DIAGNOSIS — E119 Type 2 diabetes mellitus without complications: Secondary | ICD-10-CM | POA: Diagnosis not present

## 2021-03-02 DIAGNOSIS — I10 Essential (primary) hypertension: Secondary | ICD-10-CM

## 2021-03-02 LAB — POCT GLYCOSYLATED HEMOGLOBIN (HGB A1C): Hemoglobin A1C: 10.1 % — AB (ref 4.0–5.6)

## 2021-03-02 NOTE — Progress Notes (Signed)
Established patient visit   Patient: Anna Williams   DOB: 03/15/1946   75 y.o. Female  MRN: [id number] Visit Date: 03/02/2021  Today's healthcare provider: Wilhemena Durie, MD   Chief Complaint  Patient presents with  . Diabetes  . Hypertension   Subjective    HPI  Patient states that she is feeling much better recently. She is not following her blood sugars regularly. Cognitively she appears to be stable recently.  Her husband is not with her today. Diabetes Mellitus Type II, follow-up  Lab Results  Component Value Date   HGBA1C 12.3 (H) 12/02/2020   HGBA1C 8.2 (A) 06/30/2020   HGBA1C 7.4 (H) 03/10/2020   Last seen for diabetes 1 months ago.  Management since then includes; Follow-up A1c in about a month.  Goal less than 8-8.5.  I am worried getting any tighter than that will be difficult as her cognition is not quite what it was. She reports excellent compliance with treatment. She is not having side effects.    Home blood sugar records: patient reports readings in the 200s  Episodes of hypoglycemia? No     Current insulin regiment: none Most Recent Eye Exam: >1 month  --------------------------------------------------------------------------------------------------- Hypertension, follow-up  BP Readings from Last 3 Encounters:  03/02/21 129/68  02/05/21 128/72  12/01/20 130/63   Wt Readings from Last 3 Encounters:  03/02/21 141 lb 3.2 oz (64 kg)  02/05/21 141 lb (64 kg)  12/01/20 141 lb 6.4 oz (64.1 kg)     She was last seen for hypertension 1 months ago.  BP at that visit was 128/72. Management since that visit includes; Good control. She reports excellent compliance with treatment. She is not having side effects.   She is exercising. She is not adherent to low salt diet.   Outside blood pressures are checked occasionally.  She does not smoke.  Use of agents associated with hypertension: none.    --------------------------------------------------------------------------------------------------- Follow up for MCI (mild cognitive impairment)  The patient was last seen for this 1 months ago. Changes made at last visit include; MMSE on next visit.  She reports good compliance with treatment. She feels that condition is Improved. She is not having side effects.   -----------------------------------------------------------------------------------------  Patient Active Problem List   Diagnosis Date Noted  . CVA (cerebral vascular accident) ([place]) 03/10/2020  . Vaginal atrophy 04/29/2016  . Fibroid, uterine 04/29/2016  . Pelvic pain in female 04/29/2016  . Hypertension 10/16/2015  . Diabetes mellitus type 2 without retinopathy ([place]) 05/27/2015  . Menopause 04/29/2015  . Fibroids 04/29/2015  . Anxiety 04/29/2015  . Cervical dysplasia 04/29/2015  . History of pulmonary embolus (PE) 04/29/2015  . Family history of breast cancer in mother 04/29/2015  . Retinal detachment 04/29/2015   Past Medical History:  Diagnosis Date  . Anxiety and depression   . ASCUS with positive high risk HPV 09/2013  . Diabetes mellitus without complication ([place])    type 2  . Elevated blood sugar   . Family history of breast cancer in mother   . Fibroid uterus   . History of pulmonary embolus (PE)   . Hypertension   . Menopause   . Mild dysplasia of cervix 09/2013   repeat pap done -ecc neg, cerival bx cin 1  . Retinal detachment    Allergies  Allergen Reactions  . Codeine Other (See Comments)    Pt went to sleep and had trouble waking up. Very sensitive to medication.  Marland Kitchen  Lmx 4 [Lidocaine]        Medications: Outpatient Medications Prior to Visit  Medication Sig  . amLODipine (NORVASC) 5 MG tablet TAKE 1 TABLET BY MOUTH EVERY DAY  . Calcium Citrate-Vitamin D (CALCIUM + D PO) Take 1 tablet by mouth daily.  . cholestyramine (QUESTRAN) 4 g packet   . glucose blood (ONE TOUCH ULTRA TEST)  test strip CHECK SUGAR ONCE DAILY  . insulin glargine, 1 Unit Dial, (TOUJEO SOLOSTAR) 300 UNIT/ML Solostar Pen Inject 5 Units into the skin daily.  Marland Kitchen lidocaine (LIDODERM) 5 % APPLY PATCH TO AFFECTED AREA WHEN AWAKE.  . mirtazapine (REMERON) 30 MG tablet TAKE 1 TABLET BY MOUTH AT BEDTIME.  . Multiple Vitamins-Minerals (PRESERVISION/LUTEIN PO) Take by mouth daily.   Marland Kitchen omeprazole (PRILOSEC) 20 MG capsule Take 1 capsule (20 mg total) by mouth 2 (two) times daily before a meal.  . ONETOUCH DELICA LANCETS FINE MISC Check sugar once daily  . pioglitazone (ACTOS) 45 MG tablet Take 1 tablet (45 mg total) by mouth daily.  . sertraline (ZOLOFT) 50 MG tablet TAKE 1 TABLET BY MOUTH EVERY DAY  . Apoaequorin (PREVAGEN) 10 MG CAPS Take 10 mg by mouth daily at 12 noon.  (Patient not taking: No sig reported)  . atorvastatin (LIPITOR) 40 MG tablet TAKE 1 TABLET BY MOUTH EVERY DAY (Patient not taking: No sig reported)  . buPROPion (WELLBUTRIN SR) 100 MG 12 hr tablet TAKE 1 TABLET BY MOUTH TWICE A DAY (Patient not taking: No sig reported)  . cholestyramine (QUESTRAN) 4 GM/DOSE powder Take 1 packet (4 g total) by mouth 2 (two) times daily with a meal. (Patient not taking: No sig reported)  . metFORMIN (GLUCOPHAGE-XR) 500 MG 24 hr tablet TAKE 1 TABLET BY MOUTH EVERY DAY WITH BREAKFAST (Patient not taking: No sig reported)   No facility-administered medications prior to visit.    Review of Systems  Constitutional: Negative for appetite change, chills, fatigue and fever.  Respiratory: Negative for chest tightness and shortness of breath.   Cardiovascular: Negative for chest pain and palpitations.  Gastrointestinal: Negative for abdominal pain, nausea and vomiting.  Neurological: Negative for dizziness and weakness.        Objective    BP 129/68   Pulse 68   Resp 15   Wt 141 lb 3.2 oz (64 kg)   BMI 22.12 kg/m  BP Readings from Last 3 Encounters:  03/02/21 129/68  02/05/21 128/72  12/01/20 130/63   Wt  Readings from Last 3 Encounters:  03/02/21 141 lb 3.2 oz (64 kg)  02/05/21 141 lb (64 kg)  12/01/20 141 lb 6.4 oz (64.1 kg)       Physical Exam Vitals reviewed.  Constitutional:      Appearance: Normal appearance. She is normal weight.  Eyes:     General: No scleral icterus.    Conjunctiva/sclera: Conjunctivae normal.  Cardiovascular:     Rate and Rhythm: Normal rate and regular rhythm.     Pulses: Normal pulses.     Heart sounds: Normal heart sounds.  Pulmonary:     Effort: Pulmonary effort is normal.     Breath sounds: Normal breath sounds.  Abdominal:     General: Bowel sounds are normal.     Palpations: Abdomen is soft.  Musculoskeletal:     Cervical back: Normal range of motion and neck supple.     Right lower leg: No edema.     Left lower leg: No edema.  Skin:    General:  Skin is warm and dry.  Neurological:     Mental Status: She is alert and oriented to person, place, and time. Mental status is at baseline.  Psychiatric:        Mood and Affect: Mood normal.        Behavior: Behavior normal.        Judgment: Judgment normal.       No results found for any visits on 03/02/21.  Assessment & Plan     1. Diabetes mellitus type 2 without retinopathy (HCC) A1c poorly controlled at 10.2%.  On pioglitazone and insulin.  She was unable to tolerate metformin due to diarrhea and at low doses.  This time increase insulin from 5 to 7 units daily.  And check fasting blood sugars daily look for any hypoglycemia. - POCT HgB A1C  2. Primary hypertension Good control on amlodipine.  Microalbumin on next visit  3. Cerebrovascular accident (CVA), unspecified mechanism ([place]) All risk factors treated.  Patient also on Lipitor 40  4. MCI (mild cognitive impairment) Seem to start after concussion and around the time of her stroke.  This is clinically stable at this time.  MMSE on next visit  5. Concussion without loss of consciousness, subsequent encounter Subsequent  encounter.  No further intervention or evaluation   No follow-ups on file.      I, Wilhemena Durie, MD, have reviewed all documentation for this visit. The documentation on 03/09/21 for the exam, diagnosis, procedures, and orders are all accurate and complete.    Mame Twombly Cranford Mon, MD  [facility] [phone] (phone) [phone] (fax)  [place]

## 2021-03-02 NOTE — Patient Instructions (Signed)
Increase insulin from 5 to 7 units DAILY, check and make a list of fasting blood sugar readings.   Type 2 Diabetes Mellitus, Self-Care, Adult When you have type 2 diabetes (type 2 diabetes mellitus), you must make sure your blood sugar (glucose) stays in a healthy range. You can do this with:  Nutrition.  Exercise.  Lifestyle changes.  Medicines or insulin, if needed.  Support from your doctors and others. What are the risks? Having diabetes can raise your risk for other long-term (chronic) health problems. You may get medicines to help prevent these problems. How to stay aware of blood sugar  Check your blood sugar level every day, as often as told.  Have your A1C (hemoglobin A1C) level checked two or more times a year. Have it checked more often if told.  Your doctor will set personal treatment goals for you. In general, you should have these blood sugar levels: ? Before meals: 80-130 mg/dL (4.4-7.2 mmol/L). ? After meals: below 180 mg/dL (10 mmol/L). ? A1C: less than 7%.   How to manage high and low blood sugar Symptoms of high blood sugar High blood sugar is also called hyperglycemia. Know the symptoms of high blood sugar. These may include:  More thirst.  Hunger.  Feeling very tired.  Needing to pee (urinate) more often than normal.  Seeing things blurry. Symptoms of low blood sugar Low blood sugar is also called hypoglycemia. This is when blood sugar is at or below 70 mg/dL (3.9 mmol/L). Symptoms may include:  Hunger.  Feeling worried or nervous (anxious).  Feeling sweaty and cold to the touch (clammy).  Being dizzy or light-headed.  Feeling sleepy.  A fast heartbeat.  Feeling grouchy (irritable).  Tingling or loss of feeling (numbness) around your mouth, lips, or tongue.  Restless sleep. Diabetes medicines can cause low blood sugar. You are more at risk:  While you exercise.  After exercise.  During sleep.  When you are sick.  When you skip  meals or do not eat for a long time. Treating low blood sugar If you think you have low blood sugar, eat or drink something sugary right away. Keep 15 grams of a fast-acting carb (carbohydrate) with you all the time. Make sure your family and friends know how to treat you if you cannot treat yourself. Treating very low blood sugar Severe hypoglycemia is when your blood sugar is at or below 54 mg/dL (3 mmol/L). Severe hypoglycemia is an emergency. Do not wait to see if the symptoms will go away. Get medical help right away. Call your local emergency services (911 in the [place] Do not drive yourself to the hospital. You may need a glucagon shot if you have very low blood sugar and you cannot eat or drink. Have a family member or friend learn how to check your blood sugar and how to give you a glucagon shot. Ask your doctor if you should have a kit for glucagon shots. Follow these instructions at home: Medicines  Take diabetes medicines as told. If your doctor prescribed insulin or diabetes medicines, take them each day.  Do not run out of insulin or other medicines. Plan ahead.  If you use insulin, change the amount you take based on how active you are and what foods you eat. Your doctor will tell you how to do this.  Take over-the-counter and prescription medicines only as told by your doctor. Eating and drinking  Eat healthy foods. These include: ? Low-fat (lean) proteins. ? Complex  carbs, such as whole grains. ? Fresh fruits and vegetables. ? Low-fat dairy products. ? Healthy fats.  Meet with a food expert (dietitian) to make an eating plan.  Follow instructions from your doctor about what you cannot eat or drink.  Drink enough fluid to keep your pee (urine) pale yellow.  Keep track of carbs that you eat. Read food labels and learn serving sizes of foods.  Follow your sick-day plan when you cannot eat or drink as normal. Make this plan with your doctor so it is ready to use.    Activity  Exercise as told by your doctor. You may need to: ? Do stretching and strength exercises 2 or more times a week. ? Do 150 minutes or more of exercise each week that makes your heart beat faster and makes you sweat.  Spread out your exercise over 3 or more days a week.  Do not go more than 2 days in a row without exercise.  Talk with your doctor before you start a new exercise. Your doctor may tell you to change: ? How much insulin or medicines you take. ? How much food you eat. Lifestyle  Do not use any products that contain nicotine or tobacco, such as cigarettes, e-cigarettes, and chewing tobacco. If you need help quitting, ask your doctor.  If you drink alcohol and your doctor says alcohol is safe for you: ? Limit how much you use to:  0-1 drink a day for women who are not pregnant.  0-2 drinks a day for men. ? Be aware of how much alcohol is in your drink. In the [place] one drink equals one 12 oz bottle of beer (355 mL), one 5 oz glass of wine (148 mL), or one 1 oz glass of hard liquor (44 mL).  Learn to deal with stress. If you need help, ask your doctor. Body care  Stay up to date with your shots (immunizations).  Have your eyes and feet checked by a doctor as often as told.  Check your skin and feet every day. Check for cuts, bruises, redness, blisters, or sores.  Brush your teeth and gums two times a day. Floss one or more times a day.  Go to the dentist one or more times every 6 months.  Stay at a healthy weight.   General instructions  Share your diabetes care plan with: ? Your work or school. ? People you live with.  Carry a card or wear jewelry that says you have diabetes.  Keep all follow-up visits as told by your doctor. This is important. Questions to ask your doctor  Do I need to meet with a certified expert in diabetes education and care?  Where can I find a support group? Where to find more information  American Diabetes  Association: www.diabetes.org  American Association of Diabetes Care and Education Specialists: www.diabeteseducator.org  International Diabetes Federation: MemberVerification.ca Summary  When you have type 2 diabetes, you must make sure your blood sugar (glucose) stays in a healthy range. You can do this with nutrition, exercise, medicines and insulin, and support from doctors and others.  Check your blood sugar every day, or as often as told.  Having diabetes can raise your risk for other long-term health problems. You may get medicines to help prevent these problems.  Share your diabetes management plan with people at work, school, and home.  Keep all follow-up visits as told by your doctor. This is important. This information is not intended to replace  advice given to you by your health care provider. Make sure you discuss any questions you have with your health care provider. Document Revised: 04/30/2020 Document Reviewed: 04/30/2020 Elsevier Patient Education  [place].

## 2021-03-03 ENCOUNTER — Telehealth: Payer: Self-pay

## 2021-03-03 NOTE — Telephone Encounter (Signed)
Patient's A1c was 10.1 on 03/02/21. Please advise if patient cleared for surgery. Thanks!

## 2021-03-03 NOTE — Telephone Encounter (Signed)
[place] with me if they are ok with A1C

## 2021-03-03 NOTE — Telephone Encounter (Signed)
Copied from [place] [id number]. Topic: General - Other >> Mar 03, 2021 11:38 AM Tessa Lerner A wrote: Reason for CRM: Patient is in the process of scheduling their cataract surgery and has concerns related to their A1C   Patient would like to be contacted by a member of clinical staff when possible   Patient would like the approval of their PCP before they move further with scheduling the procedure

## 2021-03-04 NOTE — Telephone Encounter (Signed)
Patient was advised on this.

## 2021-03-04 NOTE — Telephone Encounter (Signed)
lmtcb okay for [facility] nurse triage to advise. KW

## 2021-03-04 NOTE — Telephone Encounter (Signed)
Advised patient as below.  

## 2021-03-12 DIAGNOSIS — L570 Actinic keratosis: Secondary | ICD-10-CM | POA: Diagnosis not present

## 2021-03-12 DIAGNOSIS — L821 Other seborrheic keratosis: Secondary | ICD-10-CM | POA: Diagnosis not present

## 2021-03-12 LAB — HM DIABETES EYE EXAM

## 2021-04-06 ENCOUNTER — Ambulatory Visit: Payer: Self-pay | Admitting: Family Medicine

## 2021-04-06 ENCOUNTER — Other Ambulatory Visit: Payer: Self-pay | Admitting: Family Medicine

## 2021-04-16 DIAGNOSIS — R4701 Aphasia: Secondary | ICD-10-CM | POA: Diagnosis not present

## 2021-04-16 DIAGNOSIS — Z8673 Personal history of transient ischemic attack (TIA), and cerebral infarction without residual deficits: Secondary | ICD-10-CM | POA: Diagnosis not present

## 2021-04-27 ENCOUNTER — Encounter: Payer: Self-pay | Admitting: Family Medicine

## 2021-04-27 ENCOUNTER — Other Ambulatory Visit: Payer: Self-pay

## 2021-04-27 ENCOUNTER — Ambulatory Visit (INDEPENDENT_AMBULATORY_CARE_PROVIDER_SITE_OTHER): Payer: Medicare Other | Admitting: Family Medicine

## 2021-04-27 VITALS — BP 119/55 | HR 76 | Resp 16 | Wt 144.8 lb

## 2021-04-27 DIAGNOSIS — E119 Type 2 diabetes mellitus without complications: Secondary | ICD-10-CM | POA: Diagnosis not present

## 2021-04-27 DIAGNOSIS — R350 Frequency of micturition: Secondary | ICD-10-CM

## 2021-04-27 DIAGNOSIS — G3184 Mild cognitive impairment, so stated: Secondary | ICD-10-CM

## 2021-04-27 DIAGNOSIS — S060X0A Concussion without loss of consciousness, initial encounter: Secondary | ICD-10-CM | POA: Diagnosis not present

## 2021-04-27 DIAGNOSIS — E1169 Type 2 diabetes mellitus with other specified complication: Secondary | ICD-10-CM | POA: Diagnosis not present

## 2021-04-27 DIAGNOSIS — Z23 Encounter for immunization: Secondary | ICD-10-CM | POA: Diagnosis not present

## 2021-04-27 DIAGNOSIS — I10 Essential (primary) hypertension: Secondary | ICD-10-CM | POA: Diagnosis not present

## 2021-04-27 DIAGNOSIS — E785 Hyperlipidemia, unspecified: Secondary | ICD-10-CM

## 2021-04-27 DIAGNOSIS — I639 Cerebral infarction, unspecified: Secondary | ICD-10-CM

## 2021-04-27 LAB — POCT URINALYSIS DIPSTICK
Bilirubin, UA: NEGATIVE
Blood, UA: NEGATIVE
Glucose, UA: POSITIVE — AB
Ketones, UA: NEGATIVE
Leukocytes, UA: NEGATIVE
Nitrite, UA: NEGATIVE
Protein, UA: NEGATIVE
Spec Grav, UA: <=1.005 — AB (ref 1.010–1.025)
Urobilinogen, UA: 0.2 E.U./dL
pH, UA: 5 (ref 5.0–8.0)

## 2021-04-27 NOTE — Progress Notes (Signed)
Established patient visit   Patient: Anna Williams   DOB: [birth year]   75 y.o. Female  MRN: [id number] Visit Date: 04/27/2021  Today's healthcare provider: Wilhemena Durie, MD   Chief Complaint  Patient presents with   Diabetes   Hypertension   Follow-up   Subjective    HPI  Patient comes in today for follow-up.  She states she feels well.  Her only complaint today is recent nocturia x4.  That is the only time she is having urinary issues.  No dysuria urgency hesitancy.  No incontinence. She states her memory has been doing well and she is feeling well emotionally. She has 1 son who is an only child and she is expecting her first grandchild next month. Diabetes Mellitus Type II, follow-up  Lab Results  Component Value Date   HGBA1C 10.1 (A) 03/02/2021   HGBA1C 12.3 (H) 12/02/2020   HGBA1C 8.2 (A) 06/30/2020   Last seen for diabetes 2 months ago.  Management since then includes; This time increase insulin from 5 to 7 units daily.  And check fasting blood sugars daily look for any hypoglycemia. She reports excellent compliance with treatment. She is not having side effects.   Home blood sugar records:  patient reports glucose level of 168  Episodes of hypoglycemia? No    Current insulin regiment: 7 units daily of Toujeo Most Recent Eye Exam: 03/02/2018  --------------------------------------------------------------------------------------------------- Hypertension, follow-up  BP Readings from Last 3 Encounters:  04/27/21 (!) 119/55  03/02/21 129/68  02/05/21 128/72   Wt Readings from Last 3 Encounters:  04/27/21 144 lb 12.8 oz (65.7 kg)  03/02/21 141 lb 3.2 oz (64 kg)  02/05/21 141 lb (64 kg)     She was last seen for hypertension 2 months ago.  BP at that visit was 129/68. Management since that visit includes; Good control on amlodipine.  Microalbumin on next visit. She reports excellent compliance with treatment. She is not having side effects.   She is exercising. She is adherent to low salt diet.   Outside blood pressures are checked periodically.  She does not smoke.  Use of agents associated with hypertension: none.   --------------------------------------------------------------------------------------------------- Follow up for MCI (mild cognitive impairment)  The patient was last seen for this 2 months ago. Changes made at last visit include; MMSE on next visit. ( See neurology note 04/16/2021 MMSE score 30/30)   -----------------------------------------------------------------------------------------   Patient Active Problem List   Diagnosis Date Noted   CVA (cerebral vascular accident) ([place]) 03/10/2020   Vaginal atrophy 04/29/2016   Fibroid, uterine 04/29/2016   Pelvic pain in female 04/29/2016   Hypertension 10/16/2015   Diabetes mellitus type 2 without retinopathy ([place]) 05/27/2015   Menopause 04/29/2015   Fibroids 04/29/2015   Anxiety 04/29/2015   Cervical dysplasia 04/29/2015   History of pulmonary embolus (PE) 04/29/2015   Family history of breast cancer in mother 04/29/2015   Retinal detachment 04/29/2015   Past Medical History:  Diagnosis Date   Anxiety and depression    ASCUS with positive high risk HPV 09/2013   Diabetes mellitus without complication (HCC)    type 2   Elevated blood sugar    Family history of breast cancer in mother    Fibroid uterus    History of pulmonary embolus (PE)    Hypertension    Menopause    Mild dysplasia of cervix 09/2013   repeat pap done -ecc neg, cerival bx cin 1   Retinal detachment  Allergies  Allergen Reactions   Codeine Other (See Comments)    Pt went to sleep and had trouble waking up. Very sensitive to medication.   Lmx 4 [Lidocaine]        Medications: Outpatient Medications Prior to Visit  Medication Sig Note   amLODipine (NORVASC) 5 MG tablet TAKE 1 TABLET BY MOUTH EVERY DAY    atorvastatin (LIPITOR) 40 MG tablet TAKE 1 TABLET BY MOUTH  EVERY DAY    busPIRone (BUSPAR) 7.5 MG tablet Take 7.5 mg by mouth at bedtime.    Calcium Citrate-Vitamin D (CALCIUM + D PO) Take 1 tablet by mouth daily.    cholestyramine (QUESTRAN) 4 g packet     glucose blood (ONE TOUCH ULTRA TEST) test strip CHECK SUGAR ONCE DAILY    insulin glargine, 1 Unit Dial, (TOUJEO SOLOSTAR) 300 UNIT/ML Solostar Pen Inject 5 Units into the skin daily. 04/27/2021: 7 units daily   lidocaine (LIDODERM) 5 % APPLY PATCH TO AFFECTED AREA WHEN AWAKE.    mirtazapine (REMERON) 30 MG tablet TAKE 1 TABLET BY MOUTH AT BEDTIME.    Multiple Vitamins-Minerals (PRESERVISION/LUTEIN PO) Take by mouth daily.     omeprazole (PRILOSEC) 20 MG capsule Take 1 capsule (20 mg total) by mouth 2 (two) times daily before a meal.    ONETOUCH DELICA LANCETS FINE MISC Check sugar once daily    pioglitazone (ACTOS) 45 MG tablet Take 1 tablet (45 mg total) by mouth daily.    sertraline (ZOLOFT) 50 MG tablet TAKE 1 TABLET BY MOUTH EVERY DAY    Apoaequorin (PREVAGEN) 10 MG CAPS Take 10 mg by mouth daily at 12 noon.  (Patient not taking: No sig reported)    buPROPion (WELLBUTRIN SR) 100 MG 12 hr tablet TAKE 1 TABLET BY MOUTH TWICE A DAY (Patient not taking: No sig reported)    cholestyramine (QUESTRAN) 4 GM/DOSE powder Take 1 packet (4 g total) by mouth 2 (two) times daily with a meal. (Patient not taking: No sig reported)    [DISCONTINUED] metFORMIN (GLUCOPHAGE-XR) 500 MG 24 hr tablet TAKE 1 TABLET BY MOUTH EVERY DAY WITH BREAKFAST (Patient not taking: No sig reported)    No facility-administered medications prior to visit.    Review of Systems  Constitutional:  Negative for appetite change, chills, fatigue and fever.  Respiratory:  Negative for chest tightness and shortness of breath.   Cardiovascular:  Negative for chest pain and palpitations.  Gastrointestinal:  Negative for abdominal pain, nausea and vomiting.  Neurological:  Negative for dizziness and weakness.       Objective    BP (!)  119/55   Pulse 76   Resp 16   Wt 144 lb 12.8 oz (65.7 kg)   SpO2 96%   BMI 22.68 kg/m      Physical Exam Vitals reviewed.  Constitutional:      Appearance: Normal appearance. She is normal weight.  Eyes:     General: No scleral icterus.    Conjunctiva/sclera: Conjunctivae normal.  Cardiovascular:     Rate and Rhythm: Normal rate and regular rhythm.     Pulses: Normal pulses.     Heart sounds: Normal heart sounds.  Pulmonary:     Effort: Pulmonary effort is normal.     Breath sounds: Normal breath sounds.  Abdominal:     General: Bowel sounds are normal.     Palpations: Abdomen is soft.  Musculoskeletal:     Cervical back: Normal range of motion and neck supple.  Right lower leg: No edema.     Left lower leg: No edema.  Skin:    General: Skin is warm and dry.  Neurological:     Mental Status: She is alert and oriented to person, place, and time. Mental status is at baseline.  Psychiatric:        Mood and Affect: Mood normal.        Behavior: Behavior normal.      Results for orders placed or performed in visit on 04/27/21  POCT urinalysis dipstick  Result Value Ref Range   Color, UA dark yellow    Clarity, UA clear    Glucose, UA Positive (A) Negative   Bilirubin, UA negative    Ketones, UA negative    Spec Grav, UA <=1.005 (A) 1.010 - 1.025   Blood, UA negative    pH, UA 5.0 5.0 - 8.0   Protein, UA Negative Negative   Urobilinogen, UA 0.2 0.2 or 1.0 E.U./dL   Nitrite, UA negative    Leukocytes, UA Negative Negative   Appearance     Odor      Assessment & Plan     1. Diabetes mellitus type 2 without retinopathy ([place]) A1c on next visit.  It has been poorly controlled recently. Continue low-dose insulin at 7 units daily Toujeo glitazone 45 mg daily.  She is intolerant with GI side effects to metformin  2. Urinary frequency UA is negative.  Consider topical estrogen if the symptoms continue - POCT urinalysis dipstick  3. MCI (mild cognitive  impairment) This occurred after concussion and stroke which occurred about a year and a half ago.  This is slowly improved but still noticeable.  She is able to function well.  Husband notices also. We will plan MMSE on next visit 4. Primary hypertension Good control.  5. Cerebrovascular accident (CVA), unspecified mechanism ([place]) All risk factors treated  6. Concussion without loss of consciousness, initial encounter Occurred a year and a half ago.  7. Need for influenza vaccination   8. Hyperlipidemia associated with type 2 diabetes mellitus (HCC) Atorvastatin 40   Return in about 2 months (around 06/28/2021).      I, Wilhemena Durie, MD, have reviewed all documentation for this visit. The documentation on 05/02/21 for the exam, diagnosis, procedures, and orders are all accurate and complete.    Attie Nawabi Cranford Mon, MD  [facility] [phone] (phone) [phone] (fax)  [place]

## 2021-05-05 NOTE — Patient Instructions (Signed)
Breast Self-Awareness Breast self-awareness is knowing how your breasts look and feel. Doing breast self-awareness is important. It allows you to catch a breast problem early while it is still small and can be treated. All women should do breast self-awareness, including women who have had breast implants. Tell your doctorif you notice a change in your breasts. What you need: A mirror. A well-lit room. How to do a breast self-exam A breast self-exam is one way to learn what is normal for your breasts and tocheck for changes. To do a breast self-exam: Look for changes  Take off all the clothes above your waist. Stand in front of a mirror in a room with good lighting. Put your hands on your hips. Push your hands down. Look at your breasts and nipples in the mirror to see if one breast or nipple looks different from the other. Check to see if: The shape of one breast is different. The size of one breast is different. There are wrinkles, dips, and bumps in one breast and not the other. Look at each breast for changes in the skin, such as: Redness. Scaly areas. Look for changes in your nipples, such as: Liquid around the nipples. Bleeding. Dimpling. Redness. A change in where the nipples are.  Feel for changes  Lie on your back on the floor. Feel each breast. To do this, follow these steps: Pick a breast to feel. Put the arm closest to that breast above your head. Use your other arm to feel the nipple area of your breast. Feel the area with the pads of your three middle fingers by making small circles with your fingers. For the first circle, press lightly. For the second circle, press harder. For the third circle, press even harder. Keep making circles with your fingers at the different pressures as you move down your breast. Stop when you feel your ribs. Move your fingers a little toward the center of your body. Start making circles with your fingers again, this time going up until  you reach your collarbone. Keep making up-and-down circles until you reach your armpit. Remember to keep using the three pressures. Feel the other breast in the same way. Sit or stand in the tub or shower. With soapy water on your skin, feel each breast the same way you did in step 2 when you were lying on the floor.  Write down what you find Writing down what you find can help you remember what to tell your doctor. Write down: What is normal for each breast. Any changes you find in each breast, including: The kind of changes you find. Whether you have pain. Size and location of any lumps. When you last had your menstrual period. General tips Check your breasts every month. If you are breastfeeding, the best time to check your breasts is after you feed your baby or after you use a breast pump. If you get menstrual periods, the best time to check your breasts is 5-7 days after your menstrual period is over. With time, you will become comfortable with the self-exam, and you will begin to know if there are changes in your breasts. Contact a doctor if you: See a change in the shape or size of your breasts or nipples. See a change in the skin of your breast or nipples, such as red or scaly skin. Have fluid coming from your nipples that is not normal. Find a lump or thick area that was not there before. Have pain in   your breasts. Have any concerns about your breast health. Summary Breast self-awareness includes looking for changes in your breasts, as well as feeling for changes within your breasts. Breast self-awareness should be done in front of a mirror in a well-lit room. You should check your breasts every month. If you get menstrual periods, the best time to check your breasts is 5-7 days after your menstrual period is over. Let your doctor know of any changes you see in your breasts, including changes in size, changes on the skin, pain or tenderness, or fluid from your nipples that is  not normal. This information is not intended to replace advice given to you by your health care provider. Make sure you discuss any questions you have with your healthcare provider. Document Revised: 05/16/2018 Document Reviewed: 05/16/2018 Elsevier Patient Education  [place].

## 2021-05-06 ENCOUNTER — Encounter: Payer: Self-pay | Admitting: Obstetrics and Gynecology

## 2021-05-06 ENCOUNTER — Ambulatory Visit (INDEPENDENT_AMBULATORY_CARE_PROVIDER_SITE_OTHER): Payer: Medicare Other | Admitting: Obstetrics and Gynecology

## 2021-05-06 ENCOUNTER — Other Ambulatory Visit: Payer: Self-pay

## 2021-05-06 VITALS — BP 126/74 | HR 71 | Ht 67.0 in | Wt 143.1 lb

## 2021-05-06 DIAGNOSIS — I10 Essential (primary) hypertension: Secondary | ICD-10-CM

## 2021-05-06 DIAGNOSIS — N952 Postmenopausal atrophic vaginitis: Secondary | ICD-10-CM

## 2021-05-06 DIAGNOSIS — Z809 Family history of malignant neoplasm, unspecified: Secondary | ICD-10-CM | POA: Diagnosis not present

## 2021-05-06 DIAGNOSIS — Z1231 Encounter for screening mammogram for malignant neoplasm of breast: Secondary | ICD-10-CM

## 2021-05-06 DIAGNOSIS — Z8673 Personal history of transient ischemic attack (TIA), and cerebral infarction without residual deficits: Secondary | ICD-10-CM | POA: Diagnosis not present

## 2021-05-06 DIAGNOSIS — E119 Type 2 diabetes mellitus without complications: Secondary | ICD-10-CM | POA: Diagnosis not present

## 2021-05-06 DIAGNOSIS — Z9189 Other specified personal risk factors, not elsewhere classified: Secondary | ICD-10-CM

## 2021-05-06 DIAGNOSIS — R351 Nocturia: Secondary | ICD-10-CM

## 2021-05-06 DIAGNOSIS — Z01419 Encounter for gynecological examination (general) (routine) without abnormal findings: Secondary | ICD-10-CM

## 2021-05-06 NOTE — Progress Notes (Signed)
GYNECOLOGY ANNUAL PHYSICAL EXAM PROGRESS NOTE  Subjective:    Anna Williams is a 74 y.o. G23P2002 married female who presents for an annual exam.  The patient is not currently sexually active. Anna Williams reports that she has not had any postmenopausal bleeding, and denies the use of hormone replacement therapy. The patient wears seatbelts: yes. The patient participates in regular exercise: no. Has the patient ever been transfused or tattooed?: no. The patient reports that there is not domestic violence in her life.   The patient desires to note the following today:  1.  Notes that she is going to be a grandmother soon, grandchild is due due in August.  2. Had a fall last year and hit the back of her head, that led to a stroke. Denies any residual dysfunction. Reports use of blood thinners.    Gynecologic History  Menarche age: patient cannot recall, possibly 26 or 12.  No LMP recorded. Patient is postmenopausal. Contraception: post menopausal status History of STI's: Denies Last Pap: 04/29/2015. Results were: normal.  Denies h/o abnormal pap smears. Last mammogram: 07/25/2020. Results were: normal Last colonoscopy: 06/25/2014.  Results were: normal.  Last Dexa Scan: 07/2018.  Results were: normal (T-score 0.9)   OB History  Gravida Para Term Preterm AB Living  '2 2 2 '$ 0 0 2  SAB IAB Ectopic Multiple Live Births  0 0 0 0 2    # Outcome Date GA Lbr Len/2nd Weight Sex Delivery Anes PTL Lv  2 Term 1978    M Vag-Spont   LIV  1 Term 1964    M Vag-Spont   LIV    Past Medical History:  Diagnosis Date   Anxiety and depression    ASCUS with positive high risk HPV 09/2013   Diabetes mellitus without complication (HCC)    type 2   Elevated blood sugar    Family history of breast cancer in mother    Fibroid uterus    History of pulmonary embolus (PE)    Hypertension    Menopause    Mild dysplasia of cervix 09/2013   repeat pap done -ecc neg, cerival bx cin 1   Retinal detachment      Past Surgical History:  Procedure Laterality Date   anterior neck fusion     BREAST CYST ASPIRATION Left yrs ago   CHOLECYSTECTOMY  2010   [place]     HYSTEROSCOPY     mole removed  2014    Family History  Problem Relation Age of Onset   Breast cancer Mother 36       reccurence age 62   Heart disease Father    Throat cancer Brother    Heart disease Brother    Lung cancer Brother        with mets to brain   Breast cancer Maternal Aunt    Colon cancer Neg Hx    Ovarian cancer Neg Hx     Social History   Socioeconomic History   Marital status: Married    Spouse name: Not on file   Number of children: 2   Years of education: Not on file   Highest education level: Associate degree: occupational, Hotel manager, or vocational program  Occupational History   Occupation: retired  Tobacco Use   Smoking status: Never   Smokeless tobacco: Never  Vaping Use   Vaping Use: Never used  Substance and Sexual Activity   Alcohol use: No   Drug use:  No   Sexual activity: Not Currently    Birth control/protection: Post-menopausal  Other Topics Concern   Not on file  Social History Narrative   Not on file   Social Determinants of Health   Financial Resource Strain: Low Risk    Difficulty of Paying Living Expenses: Not hard at all  Food Insecurity: No Food Insecurity   Worried About Charity fundraiser in the Last Year: Never true   [place] in the Last Year: Never true  Transportation Needs: No Transportation Needs   Lack of Transportation (Medical): No   Lack of Transportation (Non-Medical): No  Physical Activity: Inactive   Days of Exercise per Week: 0 days   Minutes of Exercise per Session: 0 min  Stress: No Stress Concern Present   Feeling of Stress : Only a little  Social Connections: Engineer, building services of Communication with Friends and Family: More than three times a week   Frequency of Social Gatherings with Friends and  Family: More than three times a week   Attends Religious Services: More than 4 times per year   Active Member of [employer] or Organizations: Yes   Attends Music therapist: More than 4 times per year   Marital Status: Married  Human resources officer Violence: Not At Risk   Fear of Current or Ex-Partner: No   Emotionally Abused: No   Physically Abused: No   Sexually Abused: No       Allergies  Allergen Reactions   Codeine Other (See Comments)    Pt went to sleep and had trouble waking up. Very sensitive to medication.   Lmx 4 [Lidocaine]       Review of Systems Constitutional: negative for chills, fatigue, fevers and sweats Eyes: negative for irritation, redness and visual disturbance Ears, nose, mouth, throat, and face: negative for hearing loss, nasal congestion, snoring and tinnitus Respiratory: negative for asthma, cough, sputum Cardiovascular: negative for chest pain, dyspnea, exertional chest pressure/discomfort, irregular heart beat, palpitations and syncope Gastrointestinal: negative for abdominal pain, change in bowel habits, nausea and vomiting Genitourinary: negative for abnormal menstrual periods, genital lesions, sexual problems and vaginal discharge, dysuria and urinary incontinence. Positive for nocturia (3-4 times per night).  Integument/breast: negative for breast lump, breast tenderness and nipple discharge Hematologic/lymphatic: negative for bleeding and easy bruising Musculoskeletal:negative for back pain and muscle weakness Neurological: negative for dizziness, headaches, vertigo and weakness Endocrine: negative for diabetic symptoms including polydipsia, polyuria and skin dryness Allergic/Immunologic: negative for hay fever and urticaria       Objective:  Blood pressure 126/74, pulse 71, height '5\' 7"'$  (1.702 m), weight 143 lb 1.6 oz (64.9 kg). Body mass index is 22.41 kg/m.  General Appearance:    Alert, cooperative, no distress, appears stated age   Head:    Normocephalic, without obvious abnormality, atraumatic  Eyes:    PERRL, conjunctiva/corneas clear, EOM's intact, both eyes  Ears:    Normal external ear canals, both ears  Nose:   Nares normal, septum midline, mucosa normal, no drainage or sinus tenderness  Throat:   Lips, mucosa, and tongue normal; teeth and gums normal  Neck:   Supple, symmetrical, trachea midline, no adenopathy; thyroid: no enlargement/tenderness/nodules; no carotid bruit or JVD  Back:     Symmetric, no curvature, ROM normal, no CVA tenderness  Lungs:     Clear to auscultation bilaterally, respirations unlabored  Chest Wall:    No tenderness or deformity   Heart:  Regular rate and rhythm, S1 and S2 normal, no murmur, rub or gallop  Breast Exam:    No tenderness, masses, or nipple abnormality  Abdomen:     Soft, non-tender, bowel sounds active all four quadrants, no masses, no organomegaly.    Genitalia:    Pelvic:external genitalia normal, vagina without lesions, discharge, or tenderness, mildly atrophic. Rectovaginal septum  normal. Cervix normal in appearance, no cervical motion tenderness, no adnexal masses or tenderness.  Uterus normal size, shape, mobile, regular contours, nontender.  Rectal:    Normal external sphincter.  No hemorrhoids appreciated. Internal exam not done.   Extremities:   Extremities normal, atraumatic, no cyanosis or edema  Pulses:   2+ and symmetric all extremities  Skin:   Skin color, texture, turgor normal, no rashes or lesions  Lymph nodes:   Cervical, supraclavicular, and axillary nodes normal  Neurologic:   CNII-XII intact, normal strength, sensation and reflexes throughout   .  Labs:  Lab Results  Component Value Date   WBC 5.1 12/02/2020   HGB 13.6 12/02/2020   HCT 40.6 12/02/2020   MCV 92 12/02/2020   PLT 176 12/02/2020    Lab Results  Component Value Date   CREATININE 0.75 12/02/2020   BUN 7 (L) 12/02/2020   NA 140 12/02/2020   K 4.0 12/02/2020   CL 100  12/02/2020   CO2 24 12/02/2020    Lab Results  Component Value Date   ALT 45 (H) 12/02/2020   AST 55 (H) 12/02/2020   ALKPHOS 182 (H) 12/02/2020   BILITOT 1.1 12/02/2020    Lab Results  Component Value Date   TSH 1.410 06/09/2020     Results for orders placed or performed in visit on 04/27/21  POCT urinalysis dipstick  Result Value Ref Range   Color, UA dark yellow    Clarity, UA clear    Glucose, UA Positive (A) Negative   Bilirubin, UA negative    Ketones, UA negative    Spec Grav, UA <=1.005 (A) 1.010 - 1.025   Blood, UA negative    pH, UA 5.0 5.0 - 8.0   Protein, UA Negative Negative   Urobilinogen, UA 0.2 0.2 or 1.0 E.U./dL   Nitrite, UA negative    Leukocytes, UA Negative Negative   Appearance     Odor      Assessment:   1. Encounter for well woman exam with routine gynecological exam   2. Vaginal atrophy   3. Breast cancer screening by mammogram   4. Family history of cancer   5. Essential hypertension   6. Diabetes mellitus type 2 without retinopathy ([place])   7. History of CVA (cerebrovascular accident)     Plan:    - Labs: performed by PCP.  - Breast self exam technique reviewed and patient encouraged to perform self-exam monthly. - Contraception: post menopausal status. - Discussed healthy lifestyle modifications. Continued to encourage self-care as she is providing assistance and care to others.  - Mammogram up to date. Due in October 2022.  Discussed screening recommendations, typically discontinue screening at age 39, but due to patient's family history of breast cancer, may consider extending until age 9.  - Colon cancer screening: Up to date colonoscopy.  - Pap smears no longer needed. - Dexa Scan normal. Advised supplementation with Calcium and Vitamin D.  - Vaginal atrophy - minimal symptoms at this time. Previously discussed options of coconut oil or OTC vaginal moisturizers (given samples) as needed.   - Family history  of cancer, has not  had genetic screening performed. Discussed options last visit.Can perform if desired.  - HTN and DM manaed by PCP.  - History of CVA s/p fall (patient noted she was climbing in a chair). Is s/p anticoagulation. Counseled on fall risk.  - Nocturia - possibly secondary to fluid intake at night, but patient also with DM. No evidence of pelvic floor relaxation.  Advised on fluid restriction 2 hrs prior to bedtime, and limiting caffeine in the evenings. If still noting symptoms, can return for further evaluation.  - Follow up in 1 year for annual exam.     Rubie Maid, MD [facility]

## 2021-05-13 ENCOUNTER — Other Ambulatory Visit: Payer: Self-pay | Admitting: Family Medicine

## 2021-05-13 DIAGNOSIS — F419 Anxiety disorder, unspecified: Secondary | ICD-10-CM

## 2021-05-13 NOTE — Telephone Encounter (Signed)
Requested Prescriptions  Pending Prescriptions Disp Refills  . sertraline (ZOLOFT) 50 MG tablet [Pharmacy Med Name: SERTRALINE HCL 50 MG TABLET] 90 tablet 0    Sig: TAKE 1 TABLET BY MOUTH EVERY DAY     Psychiatry:  Antidepressants - SSRI Passed - 05/13/2021  1:27 AM      Passed - Valid encounter within last 6 months    Recent Outpatient Visits          2 weeks ago Diabetes mellitus type 2 without retinopathy [facility])   [facility] Jerrol Banana., MD   2 months ago Diabetes mellitus type 2 without retinopathy [facility])   [facility] Jerrol Banana., MD   3 months ago Diabetes mellitus type 2 without retinopathy [facility])   [facility] Jerrol Banana., MD   5 months ago Trauma of chest, initial encounter   [facility] Jerrol Banana., MD   6 months ago Diarrhea, unspecified type   [facility] Jerrol Banana., MD      Future Appointments            In 1 month Jerrol Banana., MD [facility], [place]

## 2021-05-14 ENCOUNTER — Other Ambulatory Visit: Payer: Self-pay | Admitting: Family Medicine

## 2021-05-14 DIAGNOSIS — I10 Essential (primary) hypertension: Secondary | ICD-10-CM

## 2021-05-20 DIAGNOSIS — Z23 Encounter for immunization: Secondary | ICD-10-CM | POA: Diagnosis not present

## 2021-06-01 ENCOUNTER — Other Ambulatory Visit: Payer: Self-pay | Admitting: Family Medicine

## 2021-06-01 DIAGNOSIS — K219 Gastro-esophageal reflux disease without esophagitis: Secondary | ICD-10-CM

## 2021-06-02 DIAGNOSIS — T1501XA Foreign body in cornea, right eye, initial encounter: Secondary | ICD-10-CM | POA: Diagnosis not present

## 2021-06-04 DIAGNOSIS — G479 Sleep disorder, unspecified: Secondary | ICD-10-CM | POA: Diagnosis not present

## 2021-06-04 DIAGNOSIS — Z8673 Personal history of transient ischemic attack (TIA), and cerebral infarction without residual deficits: Secondary | ICD-10-CM | POA: Diagnosis not present

## 2021-06-04 DIAGNOSIS — R4701 Aphasia: Secondary | ICD-10-CM | POA: Diagnosis not present

## 2021-06-29 ENCOUNTER — Encounter: Payer: Self-pay | Admitting: Family Medicine

## 2021-06-29 ENCOUNTER — Ambulatory Visit (INDEPENDENT_AMBULATORY_CARE_PROVIDER_SITE_OTHER): Payer: Medicare Other | Admitting: Family Medicine

## 2021-06-29 ENCOUNTER — Other Ambulatory Visit: Payer: Self-pay

## 2021-06-29 VITALS — BP 120/56 | HR 73 | Temp 98.9°F | Wt 150.6 lb

## 2021-06-29 DIAGNOSIS — Z23 Encounter for immunization: Secondary | ICD-10-CM | POA: Diagnosis not present

## 2021-06-29 DIAGNOSIS — Z780 Asymptomatic menopausal state: Secondary | ICD-10-CM | POA: Diagnosis not present

## 2021-06-29 DIAGNOSIS — I10 Essential (primary) hypertension: Secondary | ICD-10-CM | POA: Diagnosis not present

## 2021-06-29 DIAGNOSIS — E1169 Type 2 diabetes mellitus with other specified complication: Secondary | ICD-10-CM | POA: Diagnosis not present

## 2021-06-29 DIAGNOSIS — S060X0S Concussion without loss of consciousness, sequela: Secondary | ICD-10-CM | POA: Diagnosis not present

## 2021-06-29 DIAGNOSIS — I69319 Unspecified symptoms and signs involving cognitive functions following cerebral infarction: Secondary | ICD-10-CM

## 2021-06-29 DIAGNOSIS — E785 Hyperlipidemia, unspecified: Secondary | ICD-10-CM | POA: Diagnosis not present

## 2021-06-29 DIAGNOSIS — G3184 Mild cognitive impairment, so stated: Secondary | ICD-10-CM | POA: Diagnosis not present

## 2021-06-29 DIAGNOSIS — E119 Type 2 diabetes mellitus without complications: Secondary | ICD-10-CM

## 2021-06-29 LAB — POCT GLYCOSYLATED HEMOGLOBIN (HGB A1C): Hemoglobin A1C: 8.2 % — AB (ref 4.0–5.6)

## 2021-06-29 NOTE — Progress Notes (Signed)
Established patient visit   Patient: Anna Williams   DOB: Mar 24, 1946   75 y.o. Female  MRN: [id number] Visit Date: 06/29/2021  Today's healthcare provider: Wilhemena Durie, MD   No chief complaint on file.  Subjective    HPI  Patient comes in today for follow-up.  She is feeling better.  States she has not taking the metformin because of GI side effects.  Feels as though her memory is doing fine.  It is certainly not worsening.  She is not accompanied by her husband today. Diabetes Mellitus Type II, Follow-up  Lab Results  Component Value Date   HGBA1C 10.1 (A) 03/02/2021   HGBA1C 12.3 (H) 12/02/2020   HGBA1C 8.2 (A) 06/30/2020   Wt Readings from Last 3 Encounters:  05/06/21 143 lb 1.6 oz (64.9 kg)  04/27/21 144 lb 12.8 oz (65.7 kg)  03/02/21 141 lb 3.2 oz (64 kg)   Last seen for diabetes 2 months ago.  Management since then includes no medication changes. She reports good compliance with treatment. She is not having side effects.  Symptoms: Yes fatigue No foot ulcerations  No appetite changes No nausea  No paresthesia of the feet  No polydipsia  No polyuria No visual disturbances   No vomiting     Home blood sugar records:  none  Episodes of hypoglycemia? No   Current insulin regiment: 5 units into skin daily Most Recent Eye Exam: tomorrow morning about cataract Current exercise: walking Current diet habits: in general, a "healthy" diet    Pertinent Labs: Lab Results  Component Value Date   CHOL 77 (L) 12/02/2020   HDL 47 12/02/2020   LDLCALC 16 12/02/2020   TRIG 58 12/02/2020   CHOLHDL 1.6 12/02/2020   Lab Results  Component Value Date   NA 140 12/02/2020   K 4.0 12/02/2020   CREATININE 0.75 12/02/2020   GFRNONAA 78 12/02/2020   GFRAA 90 12/02/2020   GLUCOSE 394 (H) 12/02/2020       Lipid/Cholesterol, Follow-up  Last lipid panel Other pertinent labs  Lab Results  Component Value Date   CHOL 77 (L) 12/02/2020   HDL 47 12/02/2020    LDLCALC 16 12/02/2020   TRIG 58 12/02/2020   CHOLHDL 1.6 12/02/2020   Lab Results  Component Value Date   ALT 45 (H) 12/02/2020   AST 55 (H) 12/02/2020   PLT 176 12/02/2020   TSH 1.410 06/09/2020     She was last seen for this 2 months ago.  Management since that visit includes atorvastatin 40 mg .  She reports good compliance with treatment. She is not having side effects.   The ASCVD Risk score (Arnett DK, et al., 2019) failed to calculate for the following reasons:   The patient has a prior MI or stroke diagnosis  Hypertension, follow-up  BP Readings from Last 3 Encounters:  06/29/21 (!) 120/56  05/06/21 126/74  04/27/21 (!) 119/55   Wt Readings from Last 3 Encounters:  06/29/21 150 lb 9.6 oz (68.3 kg)  05/06/21 143 lb 1.6 oz (64.9 kg)  04/27/21 144 lb 12.8 oz (65.7 kg)     She was last seen for hypertension 2 months ago.  BP at that visit was 119/55. Management since that visit includes no changes.  She reports good compliance with treatment. She is not having side effects.  She is following a Regular diet. She is exercising. She does not smoke.  Use of agents associated with hypertension: none.  Outside blood pressures are none. Symptoms: No chest pain No chest pressure  No palpitations No syncope  No dyspnea No orthopnea  No paroxysmal nocturnal dyspnea No lower extremity edema   Pertinent labs: Lab Results  Component Value Date   CHOL 77 (L) 12/02/2020   HDL 47 12/02/2020   LDLCALC 16 12/02/2020   TRIG 58 12/02/2020   CHOLHDL 1.6 12/02/2020   Lab Results  Component Value Date   NA 140 12/02/2020   K 4.0 12/02/2020   CREATININE 0.75 12/02/2020   GFRNONAA 78 12/02/2020   GFRAA 90 12/02/2020   GLUCOSE 394 (H) 12/02/2020     The ASCVD Risk score (Arnett DK, et al., 2019) failed to calculate for the following reasons:   The patient has a prior MI or stroke diagnosis    ---------------------------------------------------------------------------------------------------     Medications: Outpatient Medications Prior to Visit  Medication Sig   amLODipine (NORVASC) 5 MG tablet TAKE 1 TABLET BY MOUTH EVERY DAY   atorvastatin (LIPITOR) 40 MG tablet TAKE 1 TABLET BY MOUTH EVERY DAY   busPIRone (BUSPAR) 7.5 MG tablet Take 7.5 mg by mouth at bedtime.   Calcium Citrate-Vitamin D (CALCIUM + D PO) Take 1 tablet by mouth daily.   glucose blood (ONE TOUCH ULTRA TEST) test strip CHECK SUGAR ONCE DAILY   insulin glargine, 1 Unit Dial, (TOUJEO SOLOSTAR) 300 UNIT/ML Solostar Pen Inject 5 Units into the skin daily.   mirtazapine (REMERON) 30 MG tablet TAKE 1 TABLET BY MOUTH AT BEDTIME.   Multiple Vitamins-Minerals (PRESERVISION/LUTEIN PO) Take by mouth daily.    omeprazole (PRILOSEC) 20 MG capsule TAKE 1 CAPSULE BY MOUTH EVERY DAY   ONETOUCH DELICA LANCETS FINE MISC Check sugar once daily   pioglitazone (ACTOS) 45 MG tablet Take 1 tablet (45 mg total) by mouth daily.   sertraline (ZOLOFT) 50 MG tablet TAKE 1 TABLET BY MOUTH EVERY DAY   No facility-administered medications prior to visit.    Review of Systems  Constitutional:  Negative for activity change and fatigue.  Respiratory:  Negative for cough and shortness of breath.   Cardiovascular:  Negative for chest pain, palpitations and leg swelling.  Endocrine: Negative for cold intolerance, heat intolerance, polydipsia, polyphagia and polyuria.  Musculoskeletal:  Negative for arthralgias and myalgias.  Neurological:  Negative for light-headedness and headaches.  Psychiatric/Behavioral:  Negative for agitation, self-injury, sleep disturbance and suicidal ideas. The patient is not nervous/anxious.    Last hemoglobin A1c Lab Results  Component Value Date   HGBA1C 8.2 (A) 06/29/2021       Objective    There were no vitals taken for this visit. BP Readings from Last 3 Encounters:  06/29/21 (!) 120/56  05/06/21  126/74  04/27/21 (!) 119/55   Wt Readings from Last 3 Encounters:  06/29/21 150 lb 9.6 oz (68.3 kg)  05/06/21 143 lb 1.6 oz (64.9 kg)  04/27/21 144 lb 12.8 oz (65.7 kg)      Physical Exam Vitals reviewed.  Constitutional:      Appearance: Normal appearance. She is normal weight.     Comments: Looks younger than her actual age.  Eyes:     General: No scleral icterus.    Conjunctiva/sclera: Conjunctivae normal.  Cardiovascular:     Rate and Rhythm: Normal rate and regular rhythm.     Pulses: Normal pulses.     Heart sounds: Normal heart sounds.  Pulmonary:     Effort: Pulmonary effort is normal.     Breath sounds: Normal breath  sounds.  Abdominal:     General: Bowel sounds are normal.     Palpations: Abdomen is soft.  Musculoskeletal:     Cervical back: Normal range of motion and neck supple.     Right lower leg: No edema.     Left lower leg: No edema.  Skin:    General: Skin is warm and dry.  Neurological:     Mental Status: She is alert and oriented to person, place, and time. Mental status is at baseline.  Psychiatric:        Mood and Affect: Mood normal.        Behavior: Behavior normal.      No results found for any visits on 06/29/21.  Assessment & Plan     1. Diabetes mellitus type 2 without retinopathy ([place]) A1c is improved from 10.1-8.2 and I told her I like to see her down in the sevens.  Is on Toujeo 5 units daily pioglitazone 45 mg daily - POCT glycosylated hemoglobin (Hb A1C)  2. Flu vaccine need  - Flu Vaccine QUAD High Dose(Fluad)  3. CVA, old, cognitive deficits Followed by GI recommend baby aspirin daily  4. Primary hypertension Amlodipine 5 mg daily, sitter adding low-dose ARB.  5. Concussion without loss of consciousness, sequela ([place])   6. MCI (mild cognitive impairment) MSE on next visit  7. Hyperlipidemia associated with type 2 diabetes mellitus (HCC) Atorvastatin 40  8. Menopause    No follow-ups on file.      I, Wilhemena Durie, MD, have reviewed all documentation for this visit. The documentation on 07/04/21 for the exam, diagnosis, procedures, and orders are all accurate and complete.    Warden Buffa Cranford Mon, MD  [facility] [phone] (phone) [phone] (fax)  [place]

## 2021-06-30 DIAGNOSIS — H25043 Posterior subcapsular polar age-related cataract, bilateral: Secondary | ICD-10-CM | POA: Diagnosis not present

## 2021-06-30 DIAGNOSIS — H2513 Age-related nuclear cataract, bilateral: Secondary | ICD-10-CM | POA: Diagnosis not present

## 2021-06-30 DIAGNOSIS — H2511 Age-related nuclear cataract, right eye: Secondary | ICD-10-CM | POA: Diagnosis not present

## 2021-06-30 DIAGNOSIS — H18413 Arcus senilis, bilateral: Secondary | ICD-10-CM | POA: Diagnosis not present

## 2021-06-30 DIAGNOSIS — H35371 Puckering of macula, right eye: Secondary | ICD-10-CM | POA: Diagnosis not present

## 2021-06-30 DIAGNOSIS — H25013 Cortical age-related cataract, bilateral: Secondary | ICD-10-CM | POA: Diagnosis not present

## 2021-07-09 DIAGNOSIS — L821 Other seborrheic keratosis: Secondary | ICD-10-CM | POA: Diagnosis not present

## 2021-07-09 DIAGNOSIS — L570 Actinic keratosis: Secondary | ICD-10-CM | POA: Diagnosis not present

## 2021-07-14 ENCOUNTER — Other Ambulatory Visit: Payer: Self-pay | Admitting: Family Medicine

## 2021-07-27 ENCOUNTER — Other Ambulatory Visit: Payer: Self-pay

## 2021-07-27 ENCOUNTER — Ambulatory Visit
Admission: RE | Admit: 2021-07-27 | Discharge: 2021-07-27 | Disposition: A | Discharge status: Home / Self Care | Payer: Medicare Other | Source: Ambulatory Visit | Attending: Obstetrics and Gynecology | Admitting: Obstetrics and Gynecology

## 2021-07-27 DIAGNOSIS — Z1231 Encounter for screening mammogram for malignant neoplasm of breast: Secondary | ICD-10-CM | POA: Insufficient documentation

## 2021-07-29 DIAGNOSIS — H35371 Puckering of macula, right eye: Secondary | ICD-10-CM | POA: Diagnosis not present

## 2021-07-29 DIAGNOSIS — H2513 Age-related nuclear cataract, bilateral: Secondary | ICD-10-CM | POA: Diagnosis not present

## 2021-07-29 DIAGNOSIS — H18413 Arcus senilis, bilateral: Secondary | ICD-10-CM | POA: Diagnosis not present

## 2021-07-29 DIAGNOSIS — H25013 Cortical age-related cataract, bilateral: Secondary | ICD-10-CM | POA: Diagnosis not present

## 2021-08-07 NOTE — Progress Notes (Addendum)
Anna Williams  08/11/2021     CHIEF COMPLAINT Patient presents for Retina Evaluation   HISTORY OF PRESENT ILLNESS: Anna Williams is a 75 y.o. female who presents to the clinic today for:   HPI     Retina Evaluation   In both eyes.  This started months ago.  Duration of months.  Associated Symptoms Photophobia and Glare.  Context:  distance vision, mid-range vision and near vision.  Treatments tried include no treatments.  I, the attending physician,  performed the HPI with the patient and updated documentation appropriately.        Comments   75 y/o female pt referred by Anna Williams for ret eval for clearance for cat sx.  Cat sx OD scheduled for 1 wk from today.  VA OU blurred x many mos.  Denies pain, FOL, floaters.  No gtts.  BS 108 yesterday.  A1C 8.1 4 wks ago.  Hx of RD surgery OD w/Anna Williams about 12 yrs ago.      Last edited by Anna Williams on 08/12/2021  8:30 AM.    Pt is here on the referral of Anna Williams for cataract clearance, pt had RD sx with Anna Williams in 2002, pt states afterwards, she developed a blood clot  Referring physician: Vevelyn Royals, Williams No address on file  HISTORICAL INFORMATION:  Selected notes from the [place] Referred by Anna Williams for ret eval for cat sx clearance   CURRENT MEDICATIONS: Current Outpatient Medications (Ophthalmic Drugs)  Medication Sig   PROLENSA 0.07 % SOLN Apply 1 drop to eye 2 (two) times daily.   BESIVANCE 0.6 % SUSP Place 1 drop into the right eye 3 (three) times daily. (Patient not taking: Reported on 08/11/2021)   DUREZOL 0.05 % EMUL Place 1 drop into the right eye 3 (three) times daily. (Patient not taking: Reported on 08/11/2021)   FLAREX 0.1 % ophthalmic suspension Apply to eye. (Patient not taking: Reported on 08/11/2021)   gatifloxacin (ZYMAXID) 0.5 % SOLN SMARTSIG:In Eye(s) (Patient not taking: Reported on 08/11/2021)   No current  facility-administered medications for this visit. (Ophthalmic Drugs)   Current Outpatient Medications (Other)  Medication Sig   amLODipine (NORVASC) 5 MG tablet TAKE 1 TABLET BY MOUTH EVERY DAY   atorvastatin (LIPITOR) 40 MG tablet TAKE 1 TABLET BY MOUTH EVERY DAY   BD PEN NEEDLE NANO 2ND GEN 32G X 4 MM MISC See admin instructions.   busPIRone (BUSPAR) 7.5 MG tablet Take 7.5 mg by mouth at bedtime.   Calcium Citrate-Vitamin D (CALCIUM + D PO) Take 1 tablet by mouth daily.   glucose blood (ONE TOUCH ULTRA TEST) test strip CHECK SUGAR ONCE DAILY   insulin glargine, 1 Unit Dial, (TOUJEO SOLOSTAR) 300 UNIT/ML Solostar Pen Inject 5 Units into the skin daily.   mirtazapine (REMERON) 30 MG tablet TAKE 1 TABLET BY MOUTH AT BEDTIME.   Multiple Vitamins-Minerals (PRESERVISION/LUTEIN PO) Take by mouth daily.    omeprazole (PRILOSEC) 20 MG capsule TAKE 1 CAPSULE BY MOUTH EVERY DAY   ONETOUCH DELICA LANCETS FINE MISC Check sugar once daily   pioglitazone (ACTOS) 45 MG tablet Take 1 tablet (45 mg total) by mouth daily.   sertraline (ZOLOFT) 50 MG tablet TAKE 1 TABLET BY MOUTH EVERY DAY   No current facility-administered medications for this visit. (Other)   REVIEW OF SYSTEMS: ROS   Positive for: Neurological, Endocrine, Eyes Negative for: Constitutional, Gastrointestinal, Skin, Genitourinary, Musculoskeletal, HENT,  Cardiovascular, Respiratory, Psychiatric, Allergic/Imm, Heme/Lymph Last edited by Anna Williams on 08/11/2021  1:24 PM.     ALLERGIES Allergies  Allergen Reactions   Codeine Other (See Comments)    Pt went to sleep and had trouble waking up. Very sensitive to medication.   Lmx 4 [Lidocaine]    PAST MEDICAL HISTORY Past Medical History:  Diagnosis Date   Anxiety and depression    ASCUS with positive high risk HPV 09/2013   Cataract    Diabetes mellitus without complication (HCC)    type 2   Elevated blood sugar    Family history of breast cancer in mother    Fibroid  uterus    History of pulmonary embolus (PE)    Hypertension    Menopause    Mild dysplasia of cervix 09/2013   repeat pap done -ecc neg, cerival bx cin 1   Retinal detachment    Stroke [facility])    Past Surgical History:  Procedure Laterality Date   anterior neck fusion     BREAST CYST ASPIRATION Left yrs ago   CHOLECYSTECTOMY  10/11/2008   DILATION AND CURETTAGE OF UTERUS     EYE SURGERY     HYSTEROSCOPY     mole removed  10/11/2012   RETINAL DETACHMENT SURGERY      FAMILY HISTORY Family History  Problem Relation Age of Onset   Breast cancer Mother 85       reccurence age 63   Heart disease Father    Throat cancer Brother    Heart disease Brother    Lung cancer Brother        with mets to brain   Breast cancer Maternal Aunt    Colon cancer Neg Hx    Ovarian cancer Neg Hx     SOCIAL HISTORY Social History   Tobacco Use   Smoking status: Never   Smokeless tobacco: Never  Vaping Use   Vaping Use: Never used  Substance Use Topics   Alcohol use: No   Drug use: No       OPHTHALMIC EXAM: Base Eye Exam     Visual Acuity (Snellen - Linear)       Right Left   Dist cc 20/60 -2 20/50 -2   Dist ph cc 20/40 -2 20/30 -2    Correction: Glasses         Tonometry (Tonopen, 1:28 PM)       Right Left   Pressure 14 15         Pupils       Dark Light Shape React APD   Right 4 3 Round Brisk None   Left 4 3 Round Brisk None         Visual Fields (Counting fingers)       Left Right    Full Full         Extraocular Movement       Right Left    Full, Ortho Full, Ortho         Neuro/Psych     Oriented x3: Yes   Mood/Affect: Normal         Dilation     Both eyes: 1.0% Mydriacyl, 2.5% Phenylephrine @ 1:28 PM           Slit Lamp and Fundus Exam     Slit Lamp Exam       Right Left   Lids/Lashes Dermatochalasis - upper lid Dermatochalasis - upper lid   Conjunctiva/Sclera White and  quiet White and quiet   Cornea 1-2+ Punctate  epithelial erosions, mild EBMD Trace Punctate epithelial erosions, trace EBMD   Anterior Chamber Deep and quiet Deep and quiet   Iris Round and dilated, No NVI Round and dilated, No NVI   Lens 2-3+ Nuclear sclerosis, 2-3+ Cortical cataract 2-3+ Nuclear sclerosis, 2-3+ Cortical cataract   Vitreous Vitreous syneresis, Posterior vitreous detachment Vitreous syneresis, Posterior vitreous detachment         Fundus Exam       Right Left   Disc Pink and Sharp, Compact, +PPA Pink and Sharp   C/D Ratio 0.2 0.2   Macula Flat, Blunted foveal reflex, ERM Flat, Blunted foveal reflex, mild RPE mottling, No heme or edema   Vessels mild attenuation, mild tortuousity mild attenuation, mild tortuousity   Periphery retina attached over buckle, good buckle height, good CR scars over buckle; no new RT/RD Attached, mild reticular degeneration; no RT/RD           Refraction     Wearing Rx       Sphere Cylinder Axis Add   Right -4.75 +2.00 152 +2.75   Left -3.75 +1.00 113 +2.75    Age: 16yrs   Type: PAL         Manifest Refraction       Sphere Cylinder Axis Dist VA   Right -5.25 +2.50 160 20/50-2   Left -4.00 +1.50 116 20/40+2           IMAGING AND PROCEDURES  Imaging and Procedures for 08/11/2021  OCT, Retina - OU - Both Eyes       Right Eye Quality was good. Central Foveal Thickness: 361. Progression has been stable. Findings include abnormal foveal contour, no IRF, no SRF, epiretinal membrane (ERM with very steep foveal contour -- relatively stable from 2019 scan).   Left Eye Quality was good. Central Foveal Thickness: 273. Findings include normal foveal contour, no IRF, no SRF.   Notes *Images captured and stored on drive  Diagnosis / Impression:  OD: ERM with very steep foveal contour -- relatively stable from 2019 scan OS: NFP, no IRF/SRF  Clinical management:  See below  Abbreviations: NFP - Normal foveal profile. CME - cystoid macular edema. PED - pigment  epithelial detachment. IRF - intraretinal fluid. SRF - subretinal fluid. EZ - ellipsoid zone. ERM - epiretinal membrane. ORA - outer retinal atrophy. ORT - outer retinal tubulation. SRHM - subretinal hyper-reflective material. IRHM - intraretinal hyper-reflective material            ASSESSMENT/PLAN:    ICD-10-CM   1. Epiretinal membrane (ERM) of right eye  H35.371     2. Retinal edema  H35.81 OCT, Retina - OU - Both Eyes    3. History of retinal detachment  Z86.69     4. Diabetes mellitus type 2 without retinopathy ([place])  E11.9     5. Essential hypertension  I10     6. Hypertensive retinopathy of both eyes  H35.033     7. Combined forms of age-related cataract of both eyes  H25.813      1,2. Epiretinal membrane, right eye  - The natural history, anatomy, potential for loss of vision, and treatment options including vitrectomy techniques and the complications of endophthalmitis, retinal detachment, vitreous hemorrhage, cataract progression and permanent vision loss discussed with the patient. - mild ERM w/ minimal change from 2019 scans - BCVA 20/40 - no metamorphopsia - no indication for surgery at this time - monitor -  clear from a retina standpoint to proceed with cataract surgery when pt and surgeon are ready  - can f/u here PRN  3. Hx of RD OD  - s/p SBP + laser OD (2002 -- JDM)  - retina attached and stable  - pt has been lost to f/u w/ JDM since 2019 due to the pandemic  4. Diabetes mellitus, type 2 without retinopathy - The incidence, risk factors for progression, natural history and treatment options for diabetic retinopathy  were discussed with patient.   - The need for close monitoring of blood glucose, blood pressure, and serum lipids, avoiding cigarette or any type of tobacco, and the need for long term follow up was also discussed with patient. - f/u in 1 year, sooner prn  5,6. Hypertensive retinopathy OU - discussed importance of tight BP control -  monitor  7. Mixed Cataract OU  - under the expert management of Anna Williams - The symptoms of cataract, surgical options, and treatments and risks were discussed with patient. - discussed diagnosis and progression - clear from a retina standpoint to proceed with cataract surgery when pt and surgeon are ready  - OD scheduled for surgery on August 18, 2021   Ophthalmic Meds Ordered this visit:  No orders of the defined types were placed in this encounter.    Return if symptoms worsen or fail to improve.  There are no Patient Instructions on file for this visit.   Explained the diagnoses, plan, and follow up with the patient and they expressed understanding.  Patient expressed understanding of the importance of proper follow up care.   This document serves as a record of services personally performed by Gardiner Sleeper, MD, PhD. It was created on their behalf by Estill Bakes, COT an ophthalmic technician. The creation of this record is the provider's dictation and/or activities during the visit.    Electronically signed by: Estill Bakes, [place] 10.28.22 @ 4:48 PM   This document serves as a record of services personally performed by Gardiner Sleeper, MD, PhD. It was created on their behalf by San Jetty. Owens Shark, OA an ophthalmic technician. The creation of this record is the provider's dictation and/or activities during the visit.    Electronically signed by: San Jetty. Owens Shark, [place] 11.01.2022 4:48 PM   Gardiner Sleeper, M.D., Ph.D. Diseases & Surgery of the Retina and Vitreous [facility]  I have reviewed the above documentation for accuracy and completeness, and I agree with the above. Gardiner Sleeper, M.D., Ph.D. 08/12/21 4:48 PM   Abbreviations: M myopia (nearsighted); A astigmatism; H hyperopia (farsighted); P presbyopia; Mrx spectacle prescription;  CTL contact lenses; OD right eye; OS left eye; OU both eyes  XT exotropia; ET esotropia; PEK punctate  epithelial keratitis; PEE punctate epithelial erosions; DES dry eye syndrome; MGD meibomian gland dysfunction; ATs artificial tears; PFAT's preservative free artificial tears; [place] nuclear sclerotic cataract; PSC posterior subcapsular cataract; ERM epi-retinal membrane; PVD posterior vitreous detachment; RD retinal detachment; DM diabetes mellitus; DR diabetic retinopathy; NPDR non-proliferative diabetic retinopathy; PDR proliferative diabetic retinopathy; CSME clinically significant macular edema; DME diabetic macular edema; dbh dot blot hemorrhages; CWS cotton wool spot; POAG primary open angle glaucoma; C/D cup-to-disc ratio; HVF humphrey visual field; GVF goldmann visual field; OCT optical coherence tomography; IOP intraocular pressure; BRVO Branch retinal vein occlusion; CRVO central retinal vein occlusion; CRAO central retinal artery occlusion; BRAO branch retinal artery occlusion; RT retinal tear; SB scleral buckle; PPV pars plana vitrectomy;  VH Vitreous hemorrhage; PRP panretinal laser photocoagulation; IVK intravitreal kenalog; VMT vitreomacular traction; MH Macular hole;  NVD neovascularization of the disc; NVE neovascularization elsewhere; AREDS age related eye disease study; ARMD age related macular degeneration; POAG primary open angle glaucoma; EBMD epithelial/anterior basement membrane dystrophy; ACIOL anterior chamber intraocular lens; IOL intraocular lens; PCIOL posterior chamber intraocular lens; Phaco/IOL phacoemulsification with intraocular lens placement; [place] photorefractive keratectomy; LASIK laser assisted in situ keratomileusis; HTN hypertension; DM diabetes mellitus; COPD chronic obstructive pulmonary disease

## 2021-08-09 ENCOUNTER — Other Ambulatory Visit: Payer: Self-pay | Admitting: Family Medicine

## 2021-08-09 DIAGNOSIS — F419 Anxiety disorder, unspecified: Secondary | ICD-10-CM

## 2021-08-09 NOTE — Telephone Encounter (Signed)
Requested Prescriptions  Pending Prescriptions Disp Refills  . sertraline (ZOLOFT) 50 MG tablet [Pharmacy Med Name: SERTRALINE HCL 50 MG TABLET] 90 tablet 0    Sig: TAKE 1 TABLET BY MOUTH EVERY DAY     Psychiatry:  Antidepressants - SSRI Passed - 08/09/2021 12:42 AM      Passed - Valid encounter within last 6 months    Recent Outpatient Visits          1 month ago Diabetes mellitus type 2 without retinopathy [facility])   [facility] Jerrol Banana., MD   3 months ago Diabetes mellitus type 2 without retinopathy [facility])   [facility] Jerrol Banana., MD   5 months ago Diabetes mellitus type 2 without retinopathy [facility])   [facility] Jerrol Banana., MD   6 months ago Diabetes mellitus type 2 without retinopathy [facility])   [facility] Jerrol Banana., MD   8 months ago Trauma of chest, initial encounter   [facility] Jerrol Banana., MD      Future Appointments            In 2 months Jerrol Banana., MD [facility]

## 2021-08-11 ENCOUNTER — Ambulatory Visit (INDEPENDENT_AMBULATORY_CARE_PROVIDER_SITE_OTHER): Payer: Medicare Other | Admitting: Ophthalmology

## 2021-08-11 ENCOUNTER — Other Ambulatory Visit: Payer: Self-pay

## 2021-08-11 ENCOUNTER — Encounter (INDEPENDENT_AMBULATORY_CARE_PROVIDER_SITE_OTHER): Payer: Self-pay | Admitting: Ophthalmology

## 2021-08-11 DIAGNOSIS — E119 Type 2 diabetes mellitus without complications: Secondary | ICD-10-CM

## 2021-08-11 DIAGNOSIS — H25813 Combined forms of age-related cataract, bilateral: Secondary | ICD-10-CM | POA: Diagnosis not present

## 2021-08-11 DIAGNOSIS — H35371 Puckering of macula, right eye: Secondary | ICD-10-CM

## 2021-08-11 DIAGNOSIS — Z8669 Personal history of other diseases of the nervous system and sense organs: Secondary | ICD-10-CM | POA: Diagnosis not present

## 2021-08-11 DIAGNOSIS — H3581 Retinal edema: Secondary | ICD-10-CM

## 2021-08-11 DIAGNOSIS — I10 Essential (primary) hypertension: Secondary | ICD-10-CM | POA: Diagnosis not present

## 2021-08-11 DIAGNOSIS — H35033 Hypertensive retinopathy, bilateral: Secondary | ICD-10-CM

## 2021-08-12 ENCOUNTER — Encounter (INDEPENDENT_AMBULATORY_CARE_PROVIDER_SITE_OTHER): Payer: Self-pay | Admitting: Ophthalmology

## 2021-08-13 ENCOUNTER — Other Ambulatory Visit: Payer: Self-pay | Admitting: Family Medicine

## 2021-08-13 DIAGNOSIS — I10 Essential (primary) hypertension: Secondary | ICD-10-CM

## 2021-08-13 NOTE — Telephone Encounter (Signed)
Requested Prescriptions  Pending Prescriptions Disp Refills  . amLODipine (NORVASC) 5 MG tablet [Pharmacy Med Name: AMLODIPINE BESYLATE 5 MG TAB] 90 tablet 1    Sig: TAKE 1 TABLET BY MOUTH EVERY DAY     Cardiovascular:  Calcium Channel Blockers Passed - 08/13/2021  1:49 AM      Passed - Last BP in normal range    BP Readings from Last 1 Encounters:  06/29/21 (!) 120/56         Passed - Valid encounter within last 6 months    Recent Outpatient Visits          1 month ago Diabetes mellitus type 2 without retinopathy [facility])   [facility] Jerrol Banana., MD   3 months ago Diabetes mellitus type 2 without retinopathy [facility])   [facility] Jerrol Banana., MD   5 months ago Diabetes mellitus type 2 without retinopathy [facility])   [facility] Jerrol Banana., MD   6 months ago Diabetes mellitus type 2 without retinopathy [facility])   [facility] Jerrol Banana., MD   8 months ago Trauma of chest, initial encounter   [facility] Jerrol Banana., MD      Future Appointments            In 2 months Jerrol Banana., MD [facility], [facility]

## 2021-08-18 DIAGNOSIS — H2512 Age-related nuclear cataract, left eye: Secondary | ICD-10-CM | POA: Diagnosis not present

## 2021-08-18 DIAGNOSIS — H2511 Age-related nuclear cataract, right eye: Secondary | ICD-10-CM | POA: Diagnosis not present

## 2021-08-29 ENCOUNTER — Other Ambulatory Visit: Payer: Self-pay | Admitting: Family Medicine

## 2021-08-29 DIAGNOSIS — K219 Gastro-esophageal reflux disease without esophagitis: Secondary | ICD-10-CM

## 2021-08-29 NOTE — Telephone Encounter (Signed)
Requested Prescriptions  Pending Prescriptions Disp Refills  . omeprazole (PRILOSEC) 20 MG capsule [Pharmacy Med Name: OMEPRAZOLE DR 20 MG CAPSULE] 90 capsule 0    Sig: TAKE 1 CAPSULE BY MOUTH EVERY DAY     Gastroenterology: Proton Pump Inhibitors Passed - 08/29/2021 12:43 AM      Passed - Valid encounter within last 12 months    Recent Outpatient Visits          2 months ago Diabetes mellitus type 2 without retinopathy [facility])   [facility] Jerrol Banana., MD   4 months ago Diabetes mellitus type 2 without retinopathy [facility])   [facility] Jerrol Banana., MD   6 months ago Diabetes mellitus type 2 without retinopathy [facility])   [facility] Jerrol Banana., MD   6 months ago Diabetes mellitus type 2 without retinopathy [facility])   [facility] Jerrol Banana., MD   9 months ago Trauma of chest, initial encounter   [facility] Jerrol Banana., MD      Future Appointments            In 2 months Jerrol Banana., MD [facility]

## 2021-09-08 DIAGNOSIS — H2512 Age-related nuclear cataract, left eye: Secondary | ICD-10-CM | POA: Diagnosis not present

## 2021-10-10 ENCOUNTER — Other Ambulatory Visit: Payer: Self-pay | Admitting: Family Medicine

## 2021-10-10 NOTE — Telephone Encounter (Signed)
Requested Prescriptions  Pending Prescriptions Disp Refills   atorvastatin (LIPITOR) 40 MG tablet [Pharmacy Med Name: ATORVASTATIN 40 MG TABLET] 90 tablet 0    Sig: TAKE 1 TABLET BY MOUTH EVERY DAY     Cardiovascular:  Antilipid - Statins Failed - 10/10/2021  2:21 AM      Failed - Total Cholesterol in normal range and within 360 days    Cholesterol, Total  Date Value Ref Range Status  12/02/2020 77 (L) 100 - 199 mg/dL Final         Passed - LDL in normal range and within 360 days    LDL Chol Calc (NIH)  Date Value Ref Range Status  12/02/2020 16 0 - 99 mg/dL Final         Passed - HDL in normal range and within 360 days    HDL  Date Value Ref Range Status  12/02/2020 47 >39 mg/dL Final         Passed - Triglycerides in normal range and within 360 days    Triglycerides  Date Value Ref Range Status  12/02/2020 58 0 - 149 mg/dL Final         Passed - Patient is not pregnant      Passed - Valid encounter within last 12 months    Recent Outpatient Visits          3 months ago Diabetes mellitus type 2 without retinopathy ([place])   [facility] Jerrol Banana., MD   5 months ago Diabetes mellitus type 2 without retinopathy [facility])   [facility] Jerrol Banana., MD   7 months ago Diabetes mellitus type 2 without retinopathy [facility])   [facility] Jerrol Banana., MD   8 months ago Diabetes mellitus type 2 without retinopathy [facility])   [facility] Jerrol Banana., MD   10 months ago Trauma of chest, initial encounter   [facility] Jerrol Banana., MD      Future Appointments            In 2 weeks Jerrol Banana., MD [facility]

## 2021-10-29 ENCOUNTER — Ambulatory Visit: Payer: Medicare Other | Admitting: Family Medicine

## 2021-11-05 LAB — HM DIABETES EYE EXAM

## 2021-11-17 ENCOUNTER — Other Ambulatory Visit: Payer: Self-pay | Admitting: Family Medicine

## 2021-11-17 DIAGNOSIS — F419 Anxiety disorder, unspecified: Secondary | ICD-10-CM

## 2021-11-22 ENCOUNTER — Other Ambulatory Visit: Payer: Self-pay | Admitting: Family Medicine

## 2021-11-22 DIAGNOSIS — E119 Type 2 diabetes mellitus without complications: Secondary | ICD-10-CM

## 2021-11-23 NOTE — Telephone Encounter (Signed)
Requested Prescriptions  Pending Prescriptions Disp Refills   pioglitazone (ACTOS) 45 MG tablet [Pharmacy Med Name: PIOGLITAZONE HCL 45 MG TABLET] 90 tablet 3    Sig: TAKE 1 TABLET BY MOUTH EVERY DAY     Endocrinology:  Diabetes - Glitazones - pioglitazone Failed - 11/22/2021 12:51 AM      Failed - HBA1C is between 0 and 7.9 and within 180 days    Hemoglobin A1C  Date Value Ref Range Status  06/29/2021 8.2 (A) 4.0 - 5.6 % Final   Hgb A1c MFr Bld  Date Value Ref Range Status  12/02/2020 12.3 (H) 4.8 - 5.6 % Final    Comment:             Prediabetes: 5.7 - 6.4          Diabetes: >6.4          Glycemic control for adults with diabetes: <7.0          Passed - Valid encounter within last 6 months    Recent Outpatient Visits          4 months ago Diabetes mellitus type 2 without retinopathy [facility])   [facility] Jerrol Banana., MD   7 months ago Diabetes mellitus type 2 without retinopathy [facility])   [facility] Jerrol [facility] MD   8 months ago Diabetes mellitus type 2 without retinopathy [facility])   [facility] Jerrol Banana., MD   9 months ago Diabetes mellitus type 2 without retinopathy [facility])   [facility] Jerrol Banana., MD   11 months ago Trauma of chest, initial encounter   [facility] Jerrol Banana., MD

## 2021-11-27 ENCOUNTER — Other Ambulatory Visit: Payer: Self-pay | Admitting: Family Medicine

## 2021-12-08 ENCOUNTER — Other Ambulatory Visit: Payer: Self-pay | Admitting: Family Medicine

## 2021-12-08 DIAGNOSIS — K219 Gastro-esophageal reflux disease without esophagitis: Secondary | ICD-10-CM

## 2021-12-08 NOTE — Telephone Encounter (Signed)
Requested Prescriptions  Pending Prescriptions Disp Refills   omeprazole (PRILOSEC) 20 MG capsule [Pharmacy Med Name: OMEPRAZOLE DR 20 MG CAPSULE] 90 capsule 1    Sig: TAKE 1 CAPSULE BY MOUTH EVERY DAY     Gastroenterology: Proton Pump Inhibitors Passed - 12/08/2021  1:33 AM      Passed - Valid encounter within last 12 months    Recent Outpatient Visits          5 months ago Diabetes mellitus type 2 without retinopathy [facility])   [facility] Jerrol Banana., MD   7 months ago Diabetes mellitus type 2 without retinopathy [facility])   [facility] Jerrol Banana., MD   9 months ago Diabetes mellitus type 2 without retinopathy [facility])   [facility] Jerrol Banana., MD   10 months ago Diabetes mellitus type 2 without retinopathy [facility])   [facility] Jerrol Banana., MD   1 year ago Trauma of chest, initial encounter   [facility] Jerrol Banana., MD

## 2021-12-18 ENCOUNTER — Emergency Department
Admission: EM | Admit: 2021-12-18 | Discharge: 2021-12-18 | Disposition: A | Discharge status: Home / Self Care | Payer: Medicare Other | Attending: Emergency Medicine | Admitting: Emergency Medicine

## 2021-12-18 ENCOUNTER — Other Ambulatory Visit: Payer: Self-pay

## 2021-12-18 ENCOUNTER — Emergency Department: Payer: Medicare Other

## 2021-12-18 DIAGNOSIS — E119 Type 2 diabetes mellitus without complications: Secondary | ICD-10-CM | POA: Insufficient documentation

## 2021-12-18 DIAGNOSIS — I6789 Other cerebrovascular disease: Secondary | ICD-10-CM

## 2021-12-18 DIAGNOSIS — R55 Syncope and collapse: Secondary | ICD-10-CM | POA: Diagnosis not present

## 2021-12-18 DIAGNOSIS — I679 Cerebrovascular disease, unspecified: Secondary | ICD-10-CM | POA: Diagnosis not present

## 2021-12-18 DIAGNOSIS — Y92009 Unspecified place in unspecified non-institutional (private) residence as the place of occurrence of the external cause: Secondary | ICD-10-CM | POA: Diagnosis not present

## 2021-12-18 DIAGNOSIS — W010XXA Fall on same level from slipping, tripping and stumbling without subsequent striking against object, initial encounter: Secondary | ICD-10-CM | POA: Diagnosis not present

## 2021-12-18 DIAGNOSIS — S8001XA Contusion of right knee, initial encounter: Secondary | ICD-10-CM | POA: Diagnosis not present

## 2021-12-18 DIAGNOSIS — R519 Headache, unspecified: Secondary | ICD-10-CM | POA: Diagnosis not present

## 2021-12-18 DIAGNOSIS — W19XXXA Unspecified fall, initial encounter: Secondary | ICD-10-CM | POA: Diagnosis not present

## 2021-12-18 DIAGNOSIS — I10 Essential (primary) hypertension: Secondary | ICD-10-CM | POA: Diagnosis not present

## 2021-12-18 DIAGNOSIS — Z743 Need for continuous supervision: Secondary | ICD-10-CM | POA: Diagnosis not present

## 2021-12-18 DIAGNOSIS — S80911A Unspecified superficial injury of right knee, initial encounter: Secondary | ICD-10-CM | POA: Diagnosis present

## 2021-12-18 DIAGNOSIS — R4020 Unspecified coma: Secondary | ICD-10-CM | POA: Diagnosis not present

## 2021-12-18 LAB — CBC WITH DIFFERENTIAL/PLATELET
Abs Immature Granulocytes: 0.01 10*3/uL (ref 0.00–0.07)
Basophils Absolute: 0 10*3/uL (ref 0.0–0.1)
Basophils Relative: 1 %
Eosinophils Absolute: 0.1 10*3/uL (ref 0.0–0.5)
Eosinophils Relative: 1 %
HCT: 40.5 % (ref 36.0–46.0)
Hemoglobin: 13.4 g/dL (ref 12.0–15.0)
Immature Granulocytes: 0 %
Lymphocytes Relative: 34 %
Lymphs Abs: 1.9 10*3/uL (ref 0.7–4.0)
MCH: 30.9 pg (ref 26.0–34.0)
MCHC: 33.1 g/dL (ref 30.0–36.0)
MCV: 93.5 fL (ref 80.0–100.0)
Monocytes Absolute: 0.3 10*3/uL (ref 0.1–1.0)
Monocytes Relative: 6 %
Neutro Abs: 3.3 10*3/uL (ref 1.7–7.7)
Neutrophils Relative %: 58 %
Platelets: 167 10*3/uL (ref 150–400)
RBC: 4.33 MIL/uL (ref 3.87–5.11)
RDW: 13.2 % (ref 11.5–15.5)
WBC: 5.6 10*3/uL (ref 4.0–10.5)
nRBC: 0 % (ref 0.0–0.2)

## 2021-12-18 LAB — BASIC METABOLIC PANEL
Anion gap: 7 (ref 5–15)
BUN: 16 mg/dL (ref 8–23)
CO2: 30 mmol/L (ref 22–32)
Calcium: 9.6 mg/dL (ref 8.9–10.3)
Chloride: 104 mmol/L (ref 98–111)
Creatinine, Ser: 1.01 mg/dL — ABNORMAL HIGH (ref 0.44–1.00)
GFR, Estimated: 58 mL/min — ABNORMAL LOW (ref >60)
Glucose, Bld: 163 mg/dL — ABNORMAL HIGH (ref 70–99)
Potassium: 4 mmol/L (ref 3.5–5.1)
Sodium: 141 mmol/L (ref 135–145)

## 2021-12-18 NOTE — ED Triage Notes (Signed)
BIB EMS from home for fall with LOC. Pt tripped over a rug. Pt is convering and answering questions appropriately and is asking to be home before the ball game at [place]. Vitals normal for EMS.  ?

## 2021-12-18 NOTE — ED Notes (Signed)
This RN called lab to ask what was delay in results. Lab advised they did not have patients specimen. Which this RN sent over an hour ago. I redrew and sent new labs.  ?

## 2021-12-18 NOTE — ED Provider Notes (Addendum)
? ?[facility] ?Provider Note ? ? ? Event Date/Time  ? First MD Initiated Contact with Patient 12/18/21 1413   ?  (approximate) ? ? ?History  ? ?Fall ? ? ?HPI ? ?Anna Williams is a 76 y.o. female with a past history of hypertension hyperlipidemia and type 2 diabetes who comes the ED after a fall.  Patient reports she was in her usual state of health, was at home, overseeing the return of a rug that she had cleaned.  As it was being unrolled, she tripped over it falling forward.  She landed her hands and knees.  She does not think she hit her head but there is some question about possible LOC.  She feels that she has full memory of the entire event.  She is ambulatory, denying neck pain or headache or vision changes.  No paresthesias or motor weakness.  Only symptom is some mild right knee pain.  Otherwise she states that she feels totally normal. ? ?No recent illness, no fevers or chills or vomiting.  Compliant with her medications ?  ? ? ?Physical Exam  ? ?Triage Vital Signs: ?ED Triage Vitals  ?Enc Vitals Group  ?   BP 12/18/21 1409 130/60  ?   Pulse Rate 12/18/21 1409 74  ?   Resp 12/18/21 1409 16  ?   Temp 12/18/21 1409 98.4 ?F (36.9 ?C)  ?   Temp Source 12/18/21 1409 Oral  ?   SpO2 12/18/21 1409 96 %  ?   Weight 12/18/21 1418 146 lb (66.2 kg)  ?   Height 12/18/21 1418 '5\' 7"'$  (1.702 m)  ?   Head Circumference --   ?   Peak Flow --   ?   Pain Score 12/18/21 1417 0  ?   Pain Loc --   ?   Pain Edu? --   ?   Excl. in [place]? --   ? ? ?Most recent vital signs: ?Vitals:  ? 12/18/21 1409  ?BP: 130/60  ?Pulse: 74  ?Resp: 16  ?Temp: 98.4 ?F (36.9 ?C)  ?SpO2: 96%  ? ? ? ?General: Awake, no distress.  ?CV:  Good peripheral perfusion.  Regular rate and rhythm ?Resp:  Normal effort.  Clear to auscultation bilaterally ?Abd:  No distention.  Soft and nontender ?Other:  Full range of motion all extremities.  Clavicles stable, pelvis stable.  No hip tenderness.  Knees stable without bony tenderness deformity  or wounds. ? ? ?ED Results / Procedures / Treatments  ? ?Labs ?(all labs ordered are listed, but only abnormal results are displayed) ?Labs Reviewed  ?BASIC METABOLIC PANEL  ?CBC WITH DIFFERENTIAL/PLATELET  ? ? ? ?EKG ? ?Interpreted by me ?Sinus rhythm rate of 75.  Normal axis and intervals.  Normal QRS ST segments and T waves.  No ischemic changes.  No evidence of underlying dysrhythmia. ? ? ?RADIOLOGY ? ? ? ? ?PROCEDURES: ? ?Critical Care performed: No ? ?Procedures ? ? ?MEDICATIONS ORDERED IN ED: ?Medications - No data to display ? ? ?IMPRESSION / MDM / ASSESSMENT AND PLAN / ED COURSE  ?I reviewed the triage vital signs and the nursing notes. ?             ?               ? ?Differential diagnosis includes, but is not limited to, mechanical fall, dehydration, electrolyte abnormality, hyperglycemia ? ?Patient presents with a fall, highly suspicious for mechanical fall given the history.  However with question of possible syncope will check labs given her comorbidities, obtain EKG.  If reassuring I think that patient will not require further work-up or hospitalization and will be stable for discharge home. ? ?Low suspicion for intracranial hemorrhage, stroke, pneumonia UTI sepsis vascular process ACS PE dissection or AAA.  Only complaint is some mild right knee pain, and there is no sign of joint injury or fracture. ? ? ?  ? ? ?FINAL CLINICAL IMPRESSION(S) / ED DIAGNOSES  ? ?Final diagnoses:  ?Fall in home, initial encounter  ?Contusion of right knee, initial encounter  ? ? ? ?Rx / DC Orders  ? ?ED Discharge Orders   ? ? None  ? ?  ? ? ? ?Note:  This document was prepared using Dragon voice recognition software and may include unintentional dictation errors. ?  ?Carrie Mew, MD ?12/18/21 1449 ? ?  ?Carrie Mew, MD ?12/18/21 1450 ? ?

## 2021-12-22 ENCOUNTER — Ambulatory Visit (INDEPENDENT_AMBULATORY_CARE_PROVIDER_SITE_OTHER): Payer: Medicare Other | Admitting: Physician Assistant

## 2021-12-22 ENCOUNTER — Other Ambulatory Visit: Payer: Self-pay

## 2021-12-22 ENCOUNTER — Encounter: Payer: Self-pay | Admitting: Physician Assistant

## 2021-12-22 VITALS — BP 131/73 | HR 72 | Temp 97.3°F | Resp 16 | Ht 67.0 in | Wt 154.0 lb

## 2021-12-22 DIAGNOSIS — W19XXXD Unspecified fall, subsequent encounter: Secondary | ICD-10-CM

## 2021-12-22 DIAGNOSIS — E119 Type 2 diabetes mellitus without complications: Secondary | ICD-10-CM | POA: Diagnosis not present

## 2021-12-22 DIAGNOSIS — I10 Essential (primary) hypertension: Secondary | ICD-10-CM

## 2021-12-22 DIAGNOSIS — G3184 Mild cognitive impairment, so stated: Secondary | ICD-10-CM

## 2021-12-22 LAB — POCT GLYCOSYLATED HEMOGLOBIN (HGB A1C)
Est. average glucose Bld gHb Est-mCnc: 197
Hemoglobin A1C: 8.5 % — AB (ref 4.0–5.6)

## 2021-12-22 NOTE — Assessment & Plan Note (Signed)
Chronic, historic concern  ?Appears to be semi-controlled with current medications consisting of Toujeo 5 units per day and Actos '45mg'$  PO QD ?A1c has increased to 8.5% per today's results ?Given chronicity of disease and history of medications, I recommend she follow up with PCP to discuss further management  ?Follow up in 1-2 months  ?

## 2021-12-22 NOTE — Progress Notes (Signed)
?  ? ?I,Joseline E Rosas,acting as a scribe for [employer], PA-C.,have documented all relevant documentation on the behalf of [place], PA-C,as directed by  [employer], PA-C while in the presence of Tniyah Nakagawa E Mauricia Mertens, PA-C.  ? ?Established patient visit ? ? ?Patient: Anna Williams   DOB: 07-18-1946   76 y.o. Female  MRN: [id number] ?Visit Date: 12/22/2021 ? ?Today's healthcare provider: Dani Gobble Tannen Vandezande, PA-C  ?Introduced myself to the patient as a Journalist, newspaper and provided education on APPs in clinical practice.  ? ? ?Chief Complaint  ?Patient presents with  ? follow-up chronic disease  ? ?Subjective  ?  ?HPI  ?Diabetes Mellitus Type II, follow-up ? ?Lab Results  ?Component Value Date  ? HGBA1C 8.5 (A) 12/22/2021  ? HGBA1C 8.2 (A) 06/29/2021  ? HGBA1C 10.1 (A) 03/02/2021  ? ?Last seen for diabetes 6 months ago.  ?Management since then includes continuing the same treatment. ?She reports excellent compliance with treatment. ?She is not having side effects.  ? ?Home blood sugar records: fasting range: 120's-130's ? ?Episodes of hypoglycemia? No  ?  ?Current insulin regiment: Toujeo- inject 5 units into the skin daily ?Most Recent Eye Exam: January 2023 ?Exercising: States she is walking every other day for about 15-20 minutes ?Diet: "fairly reasonably diet". States her husband is [place] so she eats quite a bit of pasta.  ? ? ? ? ? ?--------------------------------------------------------------------------------------------------- ?Hypertension, follow-up ? ?BP Readings from Last 3 Encounters:  ?12/22/21 131/73  ?12/18/21 130/60  ?06/29/21 (!) 120/56  ? Wt Readings from Last 3 Encounters:  ?12/22/21 154 lb (69.9 kg)  ?12/18/21 146 lb (66.2 kg)  ?06/29/21 150 lb 9.6 oz (68.3 kg)  ?  ? ?She was last seen for hypertension 6 months ago.  ?BP at that visit was 120/56. ?Management since that visit includes continue Amlodipine 5 mg daily, sitter adding low-dose ARB. ?She reports excellent compliance with treatment. ?She is not  having side effects.  ?She is exercising. ?She is not adherent to low salt diet.   ?Outside blood pressures are 120/70's. ? ?--------------------------------------------------------------------------------------------------- ?Lipid/Cholesterol, follow-up ? ?Last Lipid Panel: ?Lab Results  ?Component Value Date  ? CHOL 77 (L) 12/02/2020  ? [place] 16 12/02/2020  ? HDL 47 12/02/2020  ? TRIG 58 12/02/2020  ? ? ?She was last seen for this 6 months ago.  ?Management since that visit includes continue Atorvastatin 40 mg. ? ?She reports excellent compliance with treatment. ?She is not having side effects.  ? ?Symptoms: ?No appetite changes No foot ulcerations  ?No chest pain No chest pressure/discomfort  ?No dyspnea No orthopnea  ?No fatigue No lower extremity edema  ?No palpitations No paroxysmal nocturnal dyspnea  ?No nausea No numbness or tingling of extremity  ?Yes polydipsia No polyuria  ?No speech difficulty No syncope  ? ?She is following a  healthy  diet. ? ? ?Last metabolic panel ?Lab Results  ?Component Value Date  ? GLUCOSE 163 (H) 12/18/2021  ? NA 141 12/18/2021  ? K 4.0 12/18/2021  ? BUN 16 12/18/2021  ? CREATININE 1.01 (H) 12/18/2021  ? GFRNONAA 58 (L) 12/18/2021  ? CALCIUM 9.6 12/18/2021  ? AST 55 (H) 12/02/2020  ? ALT 45 (H) 12/02/2020  ? ?The ASCVD Risk score (Arnett DK, et al., 2019) failed to calculate for the following reasons: ?  The patient has a prior MI or stroke diagnosis ? ?---------------------------------------------------------------------------------------------------  ?Follow up for MCI ? ?The patient was last seen for this 3-6 months  ago. ?Changes made at last visit include none.repeat MSE at next office visit, which is today. ? ?MMSE - Mini Mental State Exam 12/22/2021 03/02/2021 06/30/2020  ?Orientation to time '5 4 4  '$ ?Orientation to Place '5 5 5  '$ ?Registration '3 3 3  '$ ?Attention/ Calculation '4 3 3  '$ ?Recall '3 2 3  '$ ?Language- name 2 objects '2 2 2  '$ ?Language- repeat '1 1 1  '$ ?Language- follow 3  step command '3 3 3  '$ ?Language- read & follow direction '1 1 1  '$ ?Write a sentence '1 1 1  '$ ?Copy design '1 1 1  '$ ?Total score '29 26 27  '$ ?  ? ?-----------------------------------------------------------------------------------------  ?Follow up for ER ?Patient was seen at ED 12/18/20 for a fall. Treated for contusion of right knee. ?She reports she is still a bit sore from the fall along right elbow and knee. Reports mild contusions and bruising but nothing else.  ? ?-----------------------------------------------------------------------------------------  ? ?Medications: ?Outpatient Medications Prior to Visit  ?Medication Sig  ? amLODipine (NORVASC) 5 MG tablet TAKE 1 TABLET BY MOUTH EVERY DAY  ? atorvastatin (LIPITOR) 40 MG tablet TAKE 1 TABLET BY MOUTH EVERY DAY  ? BD PEN NEEDLE NANO 2ND GEN 32G X 4 MM MISC See admin instructions.  ? BESIVANCE 0.6 % SUSP Place 1 drop into the right eye 3 (three) times daily.  ? busPIRone (BUSPAR) 7.5 MG tablet Take 7.5 mg by mouth at bedtime.  ? Calcium Citrate-Vitamin D (CALCIUM + D PO) Take 1 tablet by mouth daily.  ? DUREZOL 0.05 % EMUL Place 1 drop into the right eye 3 (three) times daily.  ? FLAREX 0.1 % ophthalmic suspension Apply to eye.  ? gatifloxacin (ZYMAXID) 0.5 % SOLN   ? glucose blood (ONE TOUCH ULTRA TEST) test strip CHECK SUGAR ONCE DAILY  ? insulin glargine, 1 Unit Dial, (TOUJEO SOLOSTAR) 300 UNIT/ML Solostar Pen Inject 5 Units into the skin daily.  ? mirtazapine (REMERON) 30 MG tablet TAKE 1 TABLET BY MOUTH AT BEDTIME.  ? Multiple Vitamins-Minerals (PRESERVISION/LUTEIN PO) Take by mouth daily.   ? omeprazole (PRILOSEC) 20 MG capsule TAKE 1 CAPSULE BY MOUTH EVERY DAY  ? ONETOUCH DELICA LANCETS FINE MISC Check sugar once daily  ? pioglitazone (ACTOS) 45 MG tablet TAKE 1 TABLET BY MOUTH EVERY DAY  ? PROLENSA 0.07 % SOLN Apply 1 drop to eye 2 (two) times daily.  ? sertraline (ZOLOFT) 50 MG tablet TAKE 1 TABLET BY MOUTH EVERY DAY  ? ?No facility-administered medications  prior to visit.  ? ? ?Review of Systems  ?Constitutional:  Negative for fatigue.  ?Eyes:  Negative for visual disturbance.  ?Cardiovascular:  Negative for chest pain, palpitations and leg swelling.  ?Musculoskeletal:  Negative for arthralgias, back pain, myalgias and neck pain.  ?Neurological:  Negative for dizziness, light-headedness and headaches.  ? ? ?  Objective  ?  ?BP 131/73 (BP Location: Left Arm, Patient Position: Sitting, Cuff Size: Normal)   Pulse 72   Temp (!) 97.3 ?F (36.3 ?C) (Temporal)   Resp 16   Ht '5\' 7"'$  (1.702 m)   Wt 154 lb (69.9 kg)   BMI 24.12 kg/m?  ? ? ?Physical Exam ?Vitals reviewed.  ?Constitutional:   ?   Appearance: Normal appearance.  ?HENT:  ?   Head: Normocephalic and atraumatic.  ?   Right Ear: External ear normal. There is impacted cerumen.  ?   Left Ear: Hearing, tympanic membrane, ear canal and external ear normal.  ?Eyes:  ?  Extraocular Movements: Extraocular movements intact.  ?   Conjunctiva/sclera: Conjunctivae normal.  ?   Pupils: Pupils are equal, round, and reactive to light.  ?Cardiovascular:  ?   Rate and Rhythm: Normal rate and regular rhythm.  ?   Pulses: Normal pulses.  ?   Heart sounds: Normal heart sounds.  ?Pulmonary:  ?   Effort: Pulmonary effort is normal.  ?   Breath sounds: Normal breath sounds and air entry. No decreased breath sounds, wheezing, rhonchi or rales.  ?Musculoskeletal:  ?   Cervical back: Normal range of motion and neck supple.  ?   Right lower leg: No edema.  ?   Left lower leg: No edema.  ?Neurological:  ?   Mental Status: She is alert.  ?   GCS: GCS eye subscore is 4. GCS verbal subscore is 5. GCS motor subscore is 6.  ?Psychiatric:     ?   Attention and Perception: Attention normal.     ?   Mood and Affect: Mood and affect normal.     ?   Speech: Speech normal.     ?   Behavior: Behavior normal. Behavior is cooperative.  ?  ? ? ?Results for orders placed or performed in visit on 12/22/21  ?POCT glycosylated hemoglobin (Hb A1C)  ?Result  Value Ref Range  ? Hemoglobin A1C 8.5 (A) 4.0 - 5.6 %  ? Est. average glucose Bld gHb Est-mCnc 197   ? ? Assessment & Plan  ?  ? ?Problem List Items Addressed This Visit   ? ?  ? Cardiovascular and Mediastinum  ? [place]

## 2021-12-22 NOTE — Patient Instructions (Signed)
I recommend using Debrox for your ear wax in the right ear  - just a few drops over a few days should help loosen it up ? ?Please return for your labs at your earliest convenience and we will keep you updated with those results ? ?

## 2021-12-22 NOTE — Assessment & Plan Note (Signed)
Acute, stable, appears resolved  ?Patient has MMSE score of 29  ?She was previously seen for this approx 6 months ago and requested follow up MMSE testing today ?Recommend evaluation of medications for cognitive effects- Remeron, Sertraline and Buspar if she continues to have concerns  ?Follow up as needed with repeat MMSE for monitoring ?

## 2021-12-22 NOTE — Assessment & Plan Note (Signed)
Chronic, and historic condition ?Appears well managed on Amlodipine 5 mg PO QD  ?She denies concerns for elevated BP at home or hypotension today ?Recommend she continues with current medications ?Follow up in 6 months with PCP for monitoring ? ?

## 2021-12-23 LAB — MICROALBUMIN / CREATININE URINE RATIO
Creatinine, Urine: 96.1 mg/dL
Microalb/Creat Ratio: 6 mg/g creat (ref 0–29)
Microalbumin, Urine: 5.6 ug/mL

## 2022-01-08 DIAGNOSIS — M5416 Radiculopathy, lumbar region: Secondary | ICD-10-CM | POA: Diagnosis not present

## 2022-01-15 DIAGNOSIS — M7061 Trochanteric bursitis, right hip: Secondary | ICD-10-CM | POA: Diagnosis not present

## 2022-01-19 NOTE — Progress Notes (Signed)
?  ? ? ?Established patient visit ? ?I,[name],acting as a scribe for Wilhemena Durie, MD.,have documented all relevant documentation on the behalf of Wilhemena Durie, MD,as directed by  Wilhemena Durie, MD while in the presence of Wilhemena Durie, MD. ? ? ?Patient: Anna Williams   DOB: [birth year]   76 y.o. Female  MRN: [id number] ?Visit Date: 01/20/2022 ? ?Today's healthcare provider: Wilhemena Durie, MD  ? ?Chief Complaint  ?Patient presents with  ? Follow-up  ? Diabetes  ? ?Subjective  ?  ?HPI  ?Patient is a 76 year old female who presents for follow up on diabetes for better control.  She was last seen on 12/22/21 and was advised to follow up with her PCP to discuss management of diabetic medications.  Her A1C was 8.5 on that day.   ?She is accompanied by her husband.  She complains of decreased hearing . ?She continues to have some cognitive decline which is recently stable. ?Health Maintenance ?Patient is due for DM eye (sent for eye exam results) and foot exam, colonoscopy, Hep C screening and Td vaccine.  Her last PHQ screening/monitoring was done on 12/22/21. ? ?Medications: ?Outpatient Medications Prior to Visit  ?Medication Sig  ? amLODipine (NORVASC) 5 MG tablet TAKE 1 TABLET BY MOUTH EVERY DAY  ? atorvastatin (LIPITOR) 40 MG tablet TAKE 1 TABLET BY MOUTH EVERY DAY  ? BD PEN NEEDLE NANO 2ND GEN 32G X 4 MM MISC See admin instructions.  ? Calcium Citrate-Vitamin D (CALCIUM + D PO) Take 1 tablet by mouth daily.  ? DUREZOL 0.05 % EMUL Place 1 drop into the right eye 3 (three) times daily.  ? gatifloxacin (ZYMAXID) 0.5 % SOLN   ? glucose blood (ONE TOUCH ULTRA TEST) test strip CHECK SUGAR ONCE DAILY  ? insulin glargine, 1 Unit Dial, (TOUJEO SOLOSTAR) 300 UNIT/ML Solostar Pen Inject 5 Units into the skin daily.  ? Multiple Vitamins-Minerals (PRESERVISION/LUTEIN PO) Take by mouth daily.   ? omeprazole (PRILOSEC) 20 MG capsule TAKE 1 CAPSULE BY MOUTH EVERY DAY  ? ONETOUCH DELICA LANCETS FINE  MISC Check sugar once daily  ? pioglitazone (ACTOS) 45 MG tablet TAKE 1 TABLET BY MOUTH EVERY DAY  ? sertraline (ZOLOFT) 50 MG tablet TAKE 1 TABLET BY MOUTH EVERY DAY  ? busPIRone (BUSPAR) 7.5 MG tablet Take 7.5 mg by mouth at bedtime. (Patient not taking: Reported on 01/20/2022)  ? [DISCONTINUED] BESIVANCE 0.6 % SUSP Place 1 drop into the right eye 3 (three) times daily. (Patient not taking: Reported on 01/20/2022)  ? [DISCONTINUED] FLAREX 0.1 % ophthalmic suspension Apply to eye. (Patient not taking: Reported on 01/20/2022)  ? [DISCONTINUED] mirtazapine (REMERON) 30 MG tablet TAKE 1 TABLET BY MOUTH AT BEDTIME. (Patient not taking: Reported on 01/20/2022)  ? [DISCONTINUED] PROLENSA 0.07 % SOLN Apply 1 drop to eye 2 (two) times daily. (Patient not taking: Reported on 01/20/2022)  ? ?No facility-administered medications prior to visit.  ? ? ?Review of Systems ? ?Last hemoglobin A1c ?Lab Results  ?Component Value Date  ? HGBA1C 8.5 (A) 12/22/2021  ? ?  ?  Objective  ?  ?BP 135/70 (BP Location: Right Arm, Patient Position: Sitting, Cuff Size: Normal)   Pulse 70   Temp 98 ?F (36.7 ?C) (Temporal)   Resp 16   Wt 148 lb (67.1 kg)   SpO2 93%   BMI 23.18 kg/m?  ?BP Readings from Last 3 Encounters:  ?01/20/22 135/70  ?12/22/21 131/73  ?12/18/21 130/60  ? ?  Wt Readings from Last 3 Encounters:  ?01/20/22 148 lb (67.1 kg)  ?12/22/21 154 lb (69.9 kg)  ?12/18/21 146 lb (66.2 kg)  ? ?  ? ?Physical Exam ?Vitals reviewed.  ?Constitutional:   ?   Appearance: Normal appearance. She is normal weight.  ?   Comments: Looks younger than her actual age.  ?HENT:  ?   Right Ear: External ear normal.  ?   Left Ear: External ear normal.  ?   Ears:  ?   Comments: Both EACs blocked with cerumen. ?   Mouth/Throat:  ?   Pharynx: Oropharynx is clear.  ?Eyes:  ?   General: No scleral icterus. ?   Conjunctiva/sclera: Conjunctivae normal.  ?Cardiovascular:  ?   Rate and Rhythm: Normal rate and regular rhythm.  ?   Pulses: Normal pulses.  ?   Heart  sounds: Normal heart sounds.  ?Pulmonary:  ?   Effort: Pulmonary effort is normal.  ?   Breath sounds: Normal breath sounds.  ?Abdominal:  ?   General: Bowel sounds are normal.  ?   Palpations: Abdomen is soft.  ?Musculoskeletal:  ?   Cervical back: Normal range of motion and neck supple.  ?   Right lower leg: No edema.  ?   Left lower leg: No edema.  ?Skin: ?   General: Skin is warm and dry.  ?Neurological:  ?   Mental Status: She is alert and oriented to person, place, and time.  ?   Comments: Normal monofilament exam out of her feet  ?Psychiatric:     ?   Mood and Affect: Mood normal.     ?   Behavior: Behavior normal.  ?  ? ? ?No results found for any visits on 01/20/22. ? Assessment & Plan  ?  ? ?1. Cerebrovascular accident (CVA), unspecified mechanism ([place]) ?All risk factors treated.  No deficit other than cognitive. ?She does have some chronic unsteadiness/unsteady gait since this time ?2. Type 2 diabetes mellitus with diabetic nephropathy, with long-term current use of insulin ([place]) ?Patient to work on eating habits.  She admittedly is straight in recent months. ? ?3. MCI (mild cognitive impairment) ?Started with concussion and CVA a couple years ago.  Slowly progressive.  MMSE on next visit. ? ?4. Anxiety ?Chronic issue. ?Little better now that she has a grandchild. ? ?5. Primary hypertension ?Excellent control. ? ? ?No follow-ups on file.  ?   ? ?I, Wilhemena Durie, MD, have reviewed all documentation for this visit. The documentation on 01/27/22 for the exam, diagnosis, procedures, and orders are all accurate and complete. ? ? ? ?Kealan Buchan Cranford Mon, MD  ?[facility] ?[phone] (phone) ?[phone] (fax) ? ?[facility] ?

## 2022-01-20 ENCOUNTER — Encounter: Payer: Self-pay | Admitting: Family Medicine

## 2022-01-20 ENCOUNTER — Ambulatory Visit (INDEPENDENT_AMBULATORY_CARE_PROVIDER_SITE_OTHER): Payer: Medicare Other | Admitting: Family Medicine

## 2022-01-20 VITALS — BP 135/70 | HR 70 | Temp 98.0°F | Resp 16 | Wt 148.0 lb

## 2022-01-20 DIAGNOSIS — Z794 Long term (current) use of insulin: Secondary | ICD-10-CM

## 2022-01-20 DIAGNOSIS — G3184 Mild cognitive impairment, so stated: Secondary | ICD-10-CM | POA: Diagnosis not present

## 2022-01-20 DIAGNOSIS — E1121 Type 2 diabetes mellitus with diabetic nephropathy: Secondary | ICD-10-CM | POA: Diagnosis not present

## 2022-01-20 DIAGNOSIS — I10 Essential (primary) hypertension: Secondary | ICD-10-CM

## 2022-01-20 DIAGNOSIS — I639 Cerebral infarction, unspecified: Secondary | ICD-10-CM

## 2022-01-20 DIAGNOSIS — F419 Anxiety disorder, unspecified: Secondary | ICD-10-CM

## 2022-01-20 NOTE — Patient Instructions (Signed)
Take Gabapentin 1 during the AM and 2 at bedtime.  ?Decrease Sertraline '50mg'$  to 1/2 tablet daily.  ?

## 2022-01-25 DIAGNOSIS — H5711 Ocular pain, right eye: Secondary | ICD-10-CM | POA: Diagnosis not present

## 2022-02-01 ENCOUNTER — Encounter: Payer: Self-pay | Admitting: *Deleted

## 2022-02-02 DIAGNOSIS — M25551 Pain in right hip: Secondary | ICD-10-CM | POA: Diagnosis not present

## 2022-02-02 DIAGNOSIS — M545 Low back pain, unspecified: Secondary | ICD-10-CM | POA: Diagnosis not present

## 2022-02-09 ENCOUNTER — Other Ambulatory Visit: Payer: Self-pay | Admitting: Family Medicine

## 2022-02-09 DIAGNOSIS — E119 Type 2 diabetes mellitus without complications: Secondary | ICD-10-CM

## 2022-02-09 MED ORDER — TOUJEO SOLOSTAR 300 UNIT/ML ~~LOC~~ SOPN: 5.0000 [IU] | PEN_INJECTOR | Freq: Every day | SUBCUTANEOUS | 0 refills | Status: DC

## 2022-02-09 NOTE — Telephone Encounter (Signed)
Patient is out of town - patient left her Rx at home and is out- ran out yesterday- Rx has expired so sending 1.26m without refills ?Requested Prescriptions  ?Pending Prescriptions Disp Refills  ?? insulin glargine, 1 Unit Dial, (TOUJEO SOLOSTAR) 300 UNIT/ML Solostar Pen 1.5 mL 0  ?  Sig: Inject 5 Units into the skin daily.  ?  ? Endocrinology:  Diabetes - Insulins Failed - 02/09/2022  2:23 PM  ?  ?  Failed - HBA1C is between 0 and 7.9 and within 180 days  ?  Hemoglobin A1C  ?Date Value Ref Range Status  ?12/22/2021 8.5 (A) 4.0 - 5.6 % Final  ? ?Hgb A1c MFr Bld  ?Date Value Ref Range Status  ?12/02/2020 12.3 (H) 4.8 - 5.6 % Final  ?  Comment:  ?           Prediabetes: 5.7 - 6.4 ?         Diabetes: >6.4 ?         Glycemic control for adults with diabetes: <7.0 ?  ?   ?  ?  Passed - Valid encounter within last 6 months  ?  Recent Outpatient Visits   ?      ? 2 weeks ago Cerebrovascular accident (CVA), unspecified mechanism ([place]  ? [facility]  ? 1 month ago Diabetes mellitus type 2 without retinopathy ([place]  ? [facility] Erin E, PA-C  ? 7 months ago Diabetes mellitus type 2 without retinopathy ([place]  ? [facility]  ? 9 months ago Diabetes mellitus type 2 without retinopathy ([place]  ? [facility]  ? 11 months ago Diabetes mellitus type 2 without retinopathy ([place]  ? [facility]  ?  ?  ?Future Appointments   ?        ? In 2 months [facility]  ?  ? ?  ?  ?  ? ?

## 2022-02-09 NOTE — Telephone Encounter (Signed)
Pt following up on request for  ?insulin glargine, 1 Unit Dial, (TOUJEO SOLOSTAR) 300 UNIT/ML Solostar Pen ? ?She missed today and yesterday and she is getting worried. ?I have transferred her to nurse triage for expedited assistance. ? ?

## 2022-02-09 NOTE — Telephone Encounter (Signed)
Medication Refill - Medication: insulin glargine, 1 Unit Dial, (TOUJEO SOLOSTAR) 300 UNIT/ML Solostar Pen ? ?Pt stated her needle broke/stopped working, and she is on vacation. Pt is requesting a refill be sent while she's on vacation.  ?Pt stated she missed last night's dose and today's. ? ?Pt requesting a call back.  ? ?Has the patient contacted their pharmacy? Yes.   ? ?(Agent: If yes, when and what did the pharmacy advise?) ? ?Preferred Pharmacy (with phone number or street name):  ?[id number] [place], [place] - [address] ?[address] [place] [address] ?Phone: [phone]: [phone] ?Hours: Not open 24 hours  ? ?Has the patient been seen for an appointment in the last year OR does the patient have an upcoming appointment? Yes.   ? ?Agent: Please be advised that RX refills may take up to 3 business days. We ask that you follow-up with your pharmacy.  ?

## 2022-02-11 ENCOUNTER — Other Ambulatory Visit: Payer: Self-pay | Admitting: Family Medicine

## 2022-02-11 DIAGNOSIS — M545 Low back pain, unspecified: Secondary | ICD-10-CM | POA: Diagnosis not present

## 2022-02-11 DIAGNOSIS — M25551 Pain in right hip: Secondary | ICD-10-CM | POA: Diagnosis not present

## 2022-02-12 NOTE — Telephone Encounter (Signed)
Requested medication (s) are due for refill today: Amount not specified ? ?Requested medication (s) are on the active medication list: yes   ? ?Last refill: 08/11/21 Amount not specified ? ?Future visit scheduled yes 04/27/22 ? ?Notes to clinic:Historical Provider, please review. Thank you. ? ?Requested Prescriptions  ?Pending Prescriptions Disp Refills  ? BD PEN NEEDLE NANO 2ND GEN 32G X 4 MM MISC [Pharmacy Med Name: BD NANO 2 GEN PEN NDL 32G 4MM] 100 each 11  ?  Sig: USE AS DIRECTED  ?  ? Endocrinology: Diabetes - Testing Supplies Passed - 02/11/2022  7:44 PM  ?  ?  Passed - Valid encounter within last 12 months  ?  Recent Outpatient Visits   ? ?      ? 3 weeks ago Cerebrovascular accident (CVA), unspecified mechanism ([place])  ? [facility]rol [facility]ana[facility] MD  ? 1 month ago Diabetes mellitus type 2 without retinopathy ([place])  ? [employer], Erin E, PA-C  ? 7 months ago Diabetes mellitus type 2 without retinopathy ([place])  ? [facility] Jerrol Banana., MD  ? 9 months ago Diabetes mellitus type 2 without retinopathy ([place])  ? [facility] Jerrol Banana., MD  ? 11 months ago Diabetes mellitus type 2 without retinopathy ([place])  ? [facility] Jerrol Banana., MD  ? ?  ?  ?Future Appointments   ? ?        ? In 2 months Jerrol Banana., MD [facility]  ? ?  ? ? ?  ?  ?  ? ? ? ? ?

## 2022-02-18 DIAGNOSIS — M545 Low back pain, unspecified: Secondary | ICD-10-CM | POA: Diagnosis not present

## 2022-02-18 DIAGNOSIS — M25551 Pain in right hip: Secondary | ICD-10-CM | POA: Diagnosis not present

## 2022-02-19 ENCOUNTER — Other Ambulatory Visit: Payer: Self-pay | Admitting: Family Medicine

## 2022-02-19 DIAGNOSIS — E119 Type 2 diabetes mellitus without complications: Secondary | ICD-10-CM

## 2022-02-19 NOTE — Telephone Encounter (Signed)
Requested Prescriptions  ?Pending Prescriptions Disp Refills  ?? pioglitazone (ACTOS) 45 MG tablet [Pharmacy Med Name: PIOGLITAZONE HCL 45 MG TABLET] 90 tablet 0  ?  Sig: TAKE 1 TABLET BY MOUTH EVERY DAY  ?  ? Endocrinology:  Diabetes - Glitazones - pioglitazone Failed - 02/19/2022  2:20 AM  ?  ?  Failed - HBA1C is between 0 and 7.9 and within 180 days  ?  Hemoglobin A1C  ?Date Value Ref Range Status  ?12/22/2021 8.5 (A) 4.0 - 5.6 % Final  ? ?Hgb A1c MFr Bld  ?Date Value Ref Range Status  ?12/02/2020 12.3 (H) 4.8 - 5.6 % Final  ?  Comment:  ?           Prediabetes: 5.7 - 6.4 ?         Diabetes: >6.4 ?         Glycemic control for adults with diabetes: <7.0 ?  ?   ?  ?  Passed - Valid encounter within last 6 months  ?  Recent Outpatient Visits   ?      ? 1 month ago Cerebrovascular accident (CVA), unspecified mechanism ([place])  ? [facility] Jerrol Banana., MD  ? 1 month ago Diabetes mellitus type 2 without retinopathy ([place])  ? [employer], Erin E, PA-C  ? 7 months ago Diabetes mellitus type 2 without retinopathy ([place])  ? [facility] Jerrol Banana., MD  ? 9 months ago Diabetes mellitus type 2 without retinopathy ([place])  ? [facility] Jerrol Banana., MD  ? 11 months ago Diabetes mellitus type 2 without retinopathy ([place])  ? [facility] Jerrol Banana., MD  ?  ?  ?Future Appointments   ?        ? In 2 months Jerrol Banana., MD [facility]  ? In 2 months Rubie Maid, MD [facility]  ?  ? ?  ?  ?  ? ? ?

## 2022-02-23 DIAGNOSIS — L570 Actinic keratosis: Secondary | ICD-10-CM | POA: Diagnosis not present

## 2022-02-23 DIAGNOSIS — Z872 Personal history of diseases of the skin and subcutaneous tissue: Secondary | ICD-10-CM | POA: Diagnosis not present

## 2022-02-23 DIAGNOSIS — L578 Other skin changes due to chronic exposure to nonionizing radiation: Secondary | ICD-10-CM | POA: Diagnosis not present

## 2022-02-25 ENCOUNTER — Other Ambulatory Visit: Payer: Self-pay | Admitting: Family Medicine

## 2022-02-25 DIAGNOSIS — I10 Essential (primary) hypertension: Secondary | ICD-10-CM

## 2022-02-26 ENCOUNTER — Other Ambulatory Visit: Payer: Self-pay | Admitting: Family Medicine

## 2022-02-26 DIAGNOSIS — E119 Type 2 diabetes mellitus without complications: Secondary | ICD-10-CM

## 2022-03-01 DIAGNOSIS — M533 Sacrococcygeal disorders, not elsewhere classified: Secondary | ICD-10-CM | POA: Diagnosis not present

## 2022-03-01 DIAGNOSIS — M71559 Other bursitis, not elsewhere classified, unspecified hip: Secondary | ICD-10-CM | POA: Diagnosis not present

## 2022-03-04 DIAGNOSIS — M545 Low back pain, unspecified: Secondary | ICD-10-CM | POA: Diagnosis not present

## 2022-03-04 DIAGNOSIS — M25551 Pain in right hip: Secondary | ICD-10-CM | POA: Diagnosis not present

## 2022-03-18 ENCOUNTER — Other Ambulatory Visit: Payer: Self-pay | Admitting: Family Medicine

## 2022-03-18 NOTE — Telephone Encounter (Signed)
Requested medication (s) are due for refill today - yes  Requested medication (s) are on the active medication list -yes  Future visit scheduled -yes  Last refill: 3 month ago  Notes to clinic: [place] lab protocol- over 1 year 12/02/20- open order for lab in chart  Requested Prescriptions  Pending Prescriptions Disp Refills   atorvastatin (LIPITOR) 40 MG tablet [Pharmacy Med Name: ATORVASTATIN 40 MG TABLET] 90 tablet 0    Sig: TAKE 1 TABLET BY MOUTH EVERY DAY     Cardiovascular:  Antilipid - Statins Failed - 03/18/2022  3:20 AM      Failed - Lipid Panel in normal range within the last 12 months    Cholesterol, Total  Date Value Ref Range Status  12/02/2020 77 (L) 100 - 199 mg/dL Final   LDL Chol Calc (NIH)  Date Value Ref Range Status  12/02/2020 16 0 - 99 mg/dL Final   HDL  Date Value Ref Range Status  12/02/2020 47 >39 mg/dL Final   Triglycerides  Date Value Ref Range Status  12/02/2020 58 0 - 149 mg/dL Final         Passed - Patient is not pregnant      Passed - Valid encounter within last 12 months    Recent Outpatient Visits           1 month ago Cerebrovascular accident (CVA), unspecified mechanism ([place])   [facility] Jerrol Banana., MD   2 months ago Diabetes mellitus type 2 without retinopathy ([place])   [place], Erin E, PA-C   8 months ago Diabetes mellitus type 2 without retinopathy ([place])   [facility] Jerrol Ban[facility] MD   10 months ago Diabetes mellitus type 2 without retinopathy [facility])   [facility] Jerrol Banana., MD   1 year ago Diabetes mellitus type 2 without retinopathy [facility])   [facility] Jerrol Banana., MD       Future Appointments             In 1 month Jerrol Banana., MD [facility]   In 1 month Rubie Maid, MD [facility]               Requested Prescriptions  Pending  Prescriptions Disp Refills   atorvastatin (LIPITOR) 40 MG tablet [Pharmacy Med Name: ATORVASTATIN 40 MG TABLET] 90 tablet 0    Sig: TAKE 1 TABLET BY MOUTH EVERY DAY     Cardiovascular:  Antilipid - Statins Failed - 03/18/2022  3:20 AM      Failed - Lipid Panel in normal range within the last 12 months    Cholesterol, Total  Date Value Ref Range Status  12/02/2020 77 (L) 100 - 199 mg/dL Final   LDL Chol Calc (NIH)  Date Value Ref Range Status  12/02/2020 16 0 - 99 mg/dL Final   HDL  Date Value Ref Range Status  12/02/2020 47 >39 mg/dL Final   Triglycerides  Date Value Ref Range Status  12/02/2020 58 0 - 149 mg/dL Final         Passed - Patient is not pregnant      Passed - Valid encounter within last 12 months    Recent Outpatient Visits           1 month ago Cerebrovascular accident (CVA), unspecified mechanism [facility])    [facility] Jerrol Banana., MD   2  months ago Diabetes mellitus type 2 without retinopathy ([place])   [place], Erin E, PA-C   8 months ago Diabetes mellitus type 2 without retinopathy [facility])   [facility] Jerrol Banana., MD   10 months ago Diabetes mellitus type 2 without retinopathy [facility])   [facility] Jerrol Banana., MD   1 year ago Diabetes mellitus type 2 without retinopathy [facility])   [facility] Jerrol Banana., MD       Future Appointments             In 1 month Jerrol Banana., MD [facility] [place]   In 1 month Rubie Maid, MD [facility]

## 2022-03-24 DIAGNOSIS — M25551 Pain in right hip: Secondary | ICD-10-CM | POA: Diagnosis not present

## 2022-03-24 DIAGNOSIS — M545 Low back pain, unspecified: Secondary | ICD-10-CM | POA: Diagnosis not present

## 2022-03-29 DIAGNOSIS — M533 Sacrococcygeal disorders, not elsewhere classified: Secondary | ICD-10-CM | POA: Diagnosis not present

## 2022-04-06 ENCOUNTER — Ambulatory Visit: Payer: Medicare Other | Admitting: Family Medicine

## 2022-04-27 ENCOUNTER — Ambulatory Visit (INDEPENDENT_AMBULATORY_CARE_PROVIDER_SITE_OTHER): Payer: Medicare Other | Admitting: Family Medicine

## 2022-04-27 ENCOUNTER — Encounter: Payer: Self-pay | Admitting: Family Medicine

## 2022-04-27 VITALS — BP 120/53 | HR 78 | Resp 16 | Wt 148.0 lb

## 2022-04-27 DIAGNOSIS — G3184 Mild cognitive impairment, so stated: Secondary | ICD-10-CM | POA: Diagnosis not present

## 2022-04-27 DIAGNOSIS — E1121 Type 2 diabetes mellitus with diabetic nephropathy: Secondary | ICD-10-CM | POA: Diagnosis not present

## 2022-04-27 DIAGNOSIS — Z794 Long term (current) use of insulin: Secondary | ICD-10-CM | POA: Diagnosis not present

## 2022-04-27 DIAGNOSIS — K529 Noninfective gastroenteritis and colitis, unspecified: Secondary | ICD-10-CM | POA: Diagnosis not present

## 2022-04-27 LAB — POCT GLYCOSYLATED HEMOGLOBIN (HGB A1C)
Est. average glucose Bld gHb Est-mCnc: 229
Hemoglobin A1C: 9.6 % — AB (ref 4.0–5.6)

## 2022-04-27 MED ORDER — MEMANTINE HCL 5 MG PO TABS: 5.0000 mg | ORAL_TABLET | Freq: Two times a day (BID) | ORAL | 5 refills | Status: DC

## 2022-04-27 MED ORDER — CHOLESTYRAMINE 4 G PO PACK: 4.0000 g | PACK | Freq: Two times a day (BID) | ORAL | 12 refills | Status: DC

## 2022-04-27 NOTE — Progress Notes (Signed)
Established patient visit  I,[name],acting as a scribe for Anna Durie, MD.,have documented all relevant documentation on the behalf of Anna Durie, MD,as directed by  Anna Durie, MD while in the presence of Anna Durie, MD.   Patient: Anna Williams   DOB: [birth year]   76 y.o. Female  MRN: [id number] Visit Date: 04/27/2022  Today's healthcare provider: Wilhemena Durie, MD   Chief Complaint  Patient presents with   Follow-up   Diabetes   Subjective    HPI  Patient comes in today with her husband.  Is feeling fairly well.  Her memory is starting to get worse according to her husband.  She complains of chronic diarrhea, she had her gallbladder out years ago.  The diarrhea appears to be worse in the past few months to year.  No blood in her stool and no pain or weight loss.  Diabetes Mellitus Type II, Follow-up  Lab Results  Component Value Date   HGBA1C 9.6 (A) 04/27/2022   HGBA1C 8.5 (A) 12/22/2021   HGBA1C 8.2 (A) 06/29/2021   Wt Readings from Last 3 Encounters:  04/27/22 148 lb (67.1 kg)  01/20/22 148 lb (67.1 kg)  12/22/21 154 lb (69.9 kg)   Last seen for diabetes 3 months ago.  Management since then includes encouraging patient to work on eating habits. Home blood sugar records: fasting range: 190-200 Most Recent Eye Exam: 11/05/2021  Pertinent Labs: Lab Results  Component Value Date   CHOL 77 (L) 12/02/2020   HDL 47 12/02/2020   LDLCALC 16 12/02/2020   TRIG 58 12/02/2020   CHOLHDL 1.6 12/02/2020   Lab Results  Component Value Date   NA 141 12/18/2021   K 4.0 12/18/2021   CREATININE 1.01 (H) 12/18/2021   GFRNONAA 58 (L) 12/18/2021   MICROALBUR 20 03/31/2020   LABMICR 5.6 12/22/2021     ---------------------------------------------------------------------------------------------------   Hypertension, follow-up  BP Readings from Last 3 Encounters:  04/27/22 (!) 120/53  01/20/22 135/70  12/22/21 131/73   Wt  Readings from Last 3 Encounters:  04/27/22 148 lb (67.1 kg)  01/20/22 148 lb (67.1 kg)  12/22/21 154 lb (69.9 kg)     She was last seen for hypertension 3 months ago.  BP at that visit was 135/70. Management since that visit includes continue same medication.  Pertinent labs Lab Results  Component Value Date   CHOL 77 (L) 12/02/2020   HDL 47 12/02/2020   LDLCALC 16 12/02/2020   TRIG 58 12/02/2020   CHOLHDL 1.6 12/02/2020   Lab Results  Component Value Date   NA 141 12/18/2021   K 4.0 12/18/2021   CREATININE 1.01 (H) 12/18/2021   GFRNONAA 58 (L) 12/18/2021   GLUCOSE 163 (H) 12/18/2021   TSH 1.410 06/09/2020     The ASCVD Risk score (Arnett DK, et al., 2019) failed to calculate for the following reasons:   The patient has a prior MI or stroke diagnosis  ---------------------------------------------------------------------------------------------------   Follow up for MCI (mild cognitive impairment):  The patient was last seen for this 3 months ago. Changes made at last visit include none; will obtain MMSE on next visit.  -----------------------------------------------------------------------------------------   Anxiety, Follow-up  She was last seen for anxiety 3 months ago. Changes made at last visit include none.   GAD-7 Results    09/17/2020    4:28 PM 06/30/2020   12:15 PM  GAD-7 Generalized Anxiety Disorder Screening Tool  1. Feeling Nervous,  Anxious, or on Edge 0 3  2. Not Being Able to Stop or Control Worrying 0 2  3. Worrying Too Much About Different Things 0 2  4. Trouble Relaxing 0 2  5. Being So Restless it's Hard To Sit Still 0 2  6. Becoming Easily Annoyed or Irritable 0 3  7. Feeling Afraid As If Something Awful Might Happen 0 3  Total GAD-7 Score 0 17  Difficulty At Work, Home, or Getting  Along With Others? Not difficult at all Not difficult at all    PHQ-9 Scores    01/20/2022    1:38 PM 12/22/2021   11:23 AM 04/27/2021    4:00 PM  PHQ9  SCORE ONLY  PHQ-9 Total Score '6 9 4    '$ ---------------------------------------------------------------------------------------------------   Medications: Outpatient Medications Prior to Visit  Medication Sig   amLODipine (NORVASC) 5 MG tablet TAKE 1 TABLET BY MOUTH EVERY DAY   atorvastatin (LIPITOR) 40 MG tablet TAKE 1 TABLET BY MOUTH EVERY DAY   BD PEN NEEDLE NANO 2ND GEN 32G X 4 MM MISC USE AS DIRECTED   Calcium Citrate-Vitamin D (CALCIUM + D PO) Take 1 tablet by mouth daily.   cloNIDine (CATAPRES) 0.1 MG tablet SMARTSIG:1 Tablet(s) By Mouth   DUREZOL 0.05 % EMUL Place 1 drop into the right eye 3 (three) times daily.   gatifloxacin (ZYMAXID) 0.5 % SOLN    glucose blood test strip OneTouch Ultra Test strips   insulin glargine, 1 Unit Dial, (TOUJEO SOLOSTAR) 300 UNIT/ML Solostar Pen Inject 5 Units into the skin daily.   Multiple Vitamins-Minerals (PRESERVISION/LUTEIN PO) Take by mouth daily.    omeprazole (PRILOSEC) 20 MG capsule TAKE 1 CAPSULE BY MOUTH EVERY DAY   ONETOUCH DELICA LANCETS FINE MISC Check sugar once daily   ONETOUCH ULTRA test strip CHECK SUGAR ONCE DAILY   pioglitazone (ACTOS) 45 MG tablet TAKE 1 TABLET BY MOUTH EVERY DAY   sertraline (ZOLOFT) 50 MG tablet TAKE 1 TABLET BY MOUTH EVERY DAY   [DISCONTINUED] busPIRone (BUSPAR) 7.5 MG tablet Take 7.5 mg by mouth at bedtime. (Patient not taking: Reported on 01/20/2022)   No facility-administered medications prior to visit.    Review of Systems  Last hemoglobin A1c Lab Results  Component Value Date   HGBA1C 9.6 (A) 04/27/2022       Objective    BP (!) 120/53 (BP Location: Left Arm, Patient Position: Sitting, Cuff Size: Normal)   Pulse 78   Resp 16   Wt 148 lb (67.1 kg)   SpO2 97%   BMI 23.18 kg/m  BP Readings from Last 3 Encounters:  04/27/22 (!) 120/53  01/20/22 135/70  12/22/21 131/73   Wt Readings from Last 3 Encounters:  04/27/22 148 lb (67.1 kg)  01/20/22 148 lb (67.1 kg)  12/22/21 154 lb (69.9 kg)       Physical Exam Vitals reviewed.  Constitutional:      General: She is not in acute distress.    Appearance: She is well-developed.  HENT:     Head: Normocephalic and atraumatic.     Right Ear: Hearing normal.     Left Ear: Hearing normal.     Nose: Nose normal.  Eyes:     General: Lids are normal. No scleral icterus.       Right eye: No discharge.        Left eye: No discharge.     Conjunctiva/sclera: Conjunctivae normal.  Cardiovascular:     Rate and Rhythm: Normal rate  and regular rhythm.     Heart sounds: Normal heart sounds.  Pulmonary:     Effort: Pulmonary effort is normal. No respiratory distress.  Skin:    Findings: No lesion or rash.  Neurological:     General: No focal deficit present.     Mental Status: She is alert and oriented to person, place, and time.  Psychiatric:        Mood and Affect: Mood normal.        Speech: Speech normal.        Behavior: Behavior normal.        Thought Content: Thought content normal.        Judgment: Judgment normal.         04/27/2022    4:18 PM 12/22/2021   11:36 AM 03/02/2021   10:47 AM  MMSE - Mini Mental State Exam  Orientation to time '5 5 4  '$ Orientation to Place '5 5 5  '$ Registration '3 3 3  '$ Attention/ Calculation '5 4 3  '$ Recall '3 3 2  '$ Language- name 2 objects '2 2 2  '$ Language- repeat '1 1 1  '$ Language- follow 3 step command '3 3 3  '$ Language- read & follow direction '1 1 1  '$ Write a sentence '1 1 1  '$ Copy design '1 1 1  '$ Total score '30 29 26    '$ Results for orders placed or performed in visit on 04/27/22  POCT glycosylated hemoglobin (Hb A1C)  Result Value Ref Range   Hemoglobin A1C 9.6 (A) 4.0 - 5.6 %   Est. average glucose Bld gHb Est-mCnc 229     Assessment & Plan     1. Type 2 diabetes mellitus with diabetic nephropathy, with long-term current use of insulin (HCC) A1c is gone from 8.5-9.6.  She is on maximum dose pioglitazone and is not really a candidate for GLP 1once that she has a BMI of 23.  She did not do  well on Jardiance. - POCT glycosylated hemoglobin (Hb A1C)--9.6 - CBC w/Diff/Platelet - Comprehensive Metabolic Panel (CMET) - TSH  2. Chronic diarrhea Questran twice a day.  Going to refer to GI as she could have lymphocytic colitis or some other indolent process. - cholestyramine (QUESTRAN) 4 g packet; Take 1 packet (4 g total) by mouth 2 (two) times daily.  Dispense: 60 each; Refill: 12 - Ambulatory referral to Gastroenterology - CBC w/Diff/Platelet - Comprehensive Metabolic Panel (CMET) - TSH  3. MCI (mild cognitive impairment) MMSE today is 30/30. After discussion with patient and husband they want treatment just to slow this down if possible.  Because of her GI issues I would not use donepezil but will try Namenda.  Follow-up in 3 to 4 months - memantine (NAMENDA) 5 MG tablet; Take 1 tablet (5 mg total) by mouth 2 (two) times daily.  Dispense: 30 tablet; Refill: 5 - CBC w/Diff/Platelet - Comprehensive Metabolic Panel (CMET) - TSH   Return in about 4 months (around 08/28/2022).      I, Anna Durie, MD, have reviewed all documentation for this visit. The documentation on 04/29/22 for the exam, diagnosis, procedures, and orders are all accurate and complete.    Leann Mayweather Cranford Mon, MD  [facility] [phone] (phone) [phone] (fax)  [place]

## 2022-04-28 LAB — COMPREHENSIVE METABOLIC PANEL
ALT: 24 IU/L (ref 0–32)
AST: 20 IU/L (ref 0–40)
Albumin/Globulin Ratio: 1.9 (ref 1.2–2.2)
Albumin: 4.2 g/dL (ref 3.8–4.8)
Alkaline Phosphatase: 125 IU/L — ABNORMAL HIGH (ref 44–121)
BUN/Creatinine Ratio: 9 — ABNORMAL LOW (ref 12–28)
BUN: 8 mg/dL (ref 8–27)
Bilirubin Total: 0.6 mg/dL (ref 0.0–1.2)
CO2: 24 mmol/L (ref 20–29)
Calcium: 9.4 mg/dL (ref 8.7–10.3)
Chloride: 102 mmol/L (ref 96–106)
Creatinine, Ser: 0.86 mg/dL (ref 0.57–1.00)
Globulin, Total: 2.2 g/dL (ref 1.5–4.5)
Glucose: 400 mg/dL — ABNORMAL HIGH (ref 70–99)
Potassium: 4.1 mmol/L (ref 3.5–5.2)
Sodium: 139 mmol/L (ref 134–144)
Total Protein: 6.4 g/dL (ref 6.0–8.5)
eGFR: 70 mL/min/{1.73_m2} (ref >59)

## 2022-04-28 LAB — CBC WITH DIFFERENTIAL/PLATELET
Basophils Absolute: 0 10*3/uL (ref 0.0–0.2)
Basos: 0 %
EOS (ABSOLUTE): 0.1 10*3/uL (ref 0.0–0.4)
Eos: 1 %
Hematocrit: 40.2 % (ref 34.0–46.6)
Hemoglobin: 13.4 g/dL (ref 11.1–15.9)
Immature Grans (Abs): 0 10*3/uL (ref 0.0–0.1)
Immature Granulocytes: 0 %
Lymphocytes Absolute: 1.5 10*3/uL (ref 0.7–3.1)
Lymphs: 26 %
MCH: 31.9 pg (ref 26.6–33.0)
MCHC: 33.3 g/dL (ref 31.5–35.7)
MCV: 96 fL (ref 79–97)
Monocytes Absolute: 0.4 10*3/uL (ref 0.1–0.9)
Monocytes: 6 %
Neutrophils Absolute: 3.7 10*3/uL (ref 1.4–7.0)
Neutrophils: 67 %
Platelets: 153 10*3/uL (ref 150–450)
RBC: 4.2 x10E6/uL (ref 3.77–5.28)
RDW: 11.9 % (ref 11.7–15.4)
WBC: 5.6 10*3/uL (ref 3.4–10.8)

## 2022-04-28 LAB — TSH: TSH: 1.43 u[IU]/mL (ref 0.450–4.500)

## 2022-05-11 ENCOUNTER — Other Ambulatory Visit: Payer: Self-pay | Admitting: Family Medicine

## 2022-05-11 DIAGNOSIS — G3184 Mild cognitive impairment, so stated: Secondary | ICD-10-CM

## 2022-05-12 ENCOUNTER — Encounter: Payer: Medicare Other | Admitting: Obstetrics and Gynecology

## 2022-06-13 ENCOUNTER — Other Ambulatory Visit: Payer: Self-pay | Admitting: Family Medicine

## 2022-06-13 DIAGNOSIS — K219 Gastro-esophageal reflux disease without esophagitis: Secondary | ICD-10-CM

## 2022-06-15 DIAGNOSIS — L821 Other seborrheic keratosis: Secondary | ICD-10-CM | POA: Diagnosis not present

## 2022-06-15 DIAGNOSIS — L570 Actinic keratosis: Secondary | ICD-10-CM | POA: Diagnosis not present

## 2022-06-15 DIAGNOSIS — L298 Other pruritus: Secondary | ICD-10-CM | POA: Diagnosis not present

## 2022-06-22 ENCOUNTER — Ambulatory Visit (INDEPENDENT_AMBULATORY_CARE_PROVIDER_SITE_OTHER): Payer: Medicare Other

## 2022-06-22 VITALS — Ht 67.0 in | Wt 148.0 lb

## 2022-06-22 DIAGNOSIS — Z0000 Encounter for general adult medical examination without abnormal findings: Secondary | ICD-10-CM | POA: Diagnosis not present

## 2022-06-22 NOTE — Progress Notes (Unsigned)
ANNUAL PREVENTATIVE CARE GYNECOLOGY  ENCOUNTER NOTE  Subjective:       Anna Williams is a 76 y.o. G82P2002 female here for a routine annual gynecologic exam. The patient {is/is not/has never been:13135} sexually active. The patient {is/is not:13135} taking hormone replacement therapy. {post-men bleed:13152::"Patient denies post-menopausal vaginal bleeding."} The patient wears seatbelts: {yes/no:311178}. The patient participates in regular exercise: {yes/no/not asked:9010}. Has the patient ever been transfused or tattooed?: {yes/no/not asked:9010}. The patient reports that there {is/is not:9024} domestic violence in her life.  Current complaints: 1.  ***    Gynecologic History No LMP recorded. Patient is postmenopausal. Contraception: post menopausal status Last Pap: 04/29/2015. Results were: normal.  Denies h/o abnormal pap smears. Last mammogram: 07/27/2021. Results were: normal Last Colonoscopy: Never Done Last Dexa Scan: 07/18/2018   Obstetric History OB History  Gravida Para Term Preterm AB Living  '2 2 2     2  '$ SAB IAB Ectopic Multiple Live Births          2    # Outcome Date GA Lbr Len/2nd Weight Sex Delivery Anes PTL Lv  2 Term 1978    M Vag-Spont   LIV  1 Term 1964    M Vag-Spont   LIV    Past Medical History:  Diagnosis Date   Anxiety and depression    ASCUS with positive high risk HPV 09/2013   Cataract    Diabetes mellitus without complication (HCC)    type 2   Elevated blood sugar    Family history of breast cancer in mother    Fibroid uterus    History of pulmonary embolus (PE)    Hypertension    Menopause    Mild dysplasia of cervix 09/2013   repeat pap done -ecc neg, cerival bx cin 1   Retinal detachment    Stroke ([place])     Family History  Problem Relation Age of Onset   Breast cancer Mother 32       reccurence age 26   Heart disease Father    Throat cancer Brother    Heart disease Brother    Lung cancer Brother        with mets to brain    Breast cancer Maternal Aunt    Colon cancer Neg Hx    Ovarian cancer Neg Hx     Past Surgical History:  Procedure Laterality Date   anterior neck fusion     BREAST CYST ASPIRATION Left yrs ago   CHOLECYSTECTOMY  10/11/2008   DILATION AND CURETTAGE OF UTERUS     EYE SURGERY     HYSTEROSCOPY     mole removed  10/11/2012   RETINAL DETACHMENT SURGERY      Social History   Socioeconomic History   Marital status: Married    Spouse name: Not on file   Number of children: 2   Years of education: Not on file   Highest education level: Associate degree: occupational, Hotel manager, or vocational program  Occupational History   Occupation: retired  Tobacco Use   Smoking status: Never   Smokeless tobacco: Never  Vaping Use   Vaping Use: Never used  Substance and Sexual Activity   Alcohol use: No   Drug use: No   Sexual activity: Not Currently    Birth control/protection: Post-menopausal  Other Topics Concern   Not on file  Social History Narrative   Not on file   Social Determinants of Health   Financial Resource Strain: Low Risk  (  01/01/2021)   Overall Financial Resource Strain (CARDIA)    Difficulty of Paying Living Expenses: Not hard at all  Food Insecurity: No Food Insecurity (01/01/2021)   Hunger Vital Sign    Worried About Running Out of Food in the Last Year: Never true    Ran Out of Food in the Last Year: Never true  Transportation Needs: No Transportation Needs (01/01/2021)   PRAPARE - Hydrologist (Medical): No    Lack of Transportation (Non-Medical): No  Physical Activity: Inactive (01/01/2021)   Exercise Vital Sign    Days of Exercise per Week: 0 days    Minutes of Exercise per Session: 0 min  Stress: No Stress Concern Present (01/01/2021)   [place]    Feeling of Stress : Only a little  Social Connections: Socially Integrated (01/01/2021)   Social Connection and  Isolation Panel [NHANES]    Frequency of Communication with Friends and Family: More than three times a week    Frequency of Social Gatherings with Friends and Family: More than three times a week    Attends Religious Services: More than 4 times per year    Active Member of [employer] or Organizations: Yes    Attends Music therapist: More than 4 times per year    Marital Status: Married  Human resources officer Violence: Not At Risk (01/01/2021)   Humiliation, Afraid, Rape, and Kick questionnaire    Fear of Current or Ex-Partner: No    Emotionally Abused: No    Physically Abused: No    Sexually Abused: No    Current Outpatient Medications on File Prior to Visit  Medication Sig Dispense Refill   amLODipine (NORVASC) 5 MG tablet TAKE 1 TABLET BY MOUTH EVERY DAY 90 tablet 1   atorvastatin (LIPITOR) 40 MG tablet TAKE 1 TABLET BY MOUTH EVERY DAY 30 tablet 0   BD PEN NEEDLE NANO 2ND GEN 32G X 4 MM MISC USE AS DIRECTED 100 each 11   Calcium Citrate-Vitamin D (CALCIUM + D PO) Take 1 tablet by mouth daily.     cholestyramine (QUESTRAN) 4 g packet Take 1 packet (4 g total) by mouth 2 (two) times daily. 60 each 12   cloNIDine (CATAPRES) 0.1 MG tablet SMARTSIG:1 Tablet(s) By Mouth     DUREZOL 0.05 % EMUL Place 1 drop into the right eye 3 (three) times daily.     gatifloxacin (ZYMAXID) 0.5 % SOLN      glucose blood test strip OneTouch Ultra Test strips     insulin glargine, 1 Unit Dial, (TOUJEO SOLOSTAR) 300 UNIT/ML Solostar Pen Inject 5 Units into the skin daily. 1.5 mL 0   meloxicam (MOBIC) 7.5 MG tablet Take 7.5 mg by mouth daily.     memantine (NAMENDA) 5 MG tablet TAKE 1 TABLET BY MOUTH TWICE A DAY 180 tablet 1   Multiple Vitamins-Minerals (PRESERVISION/LUTEIN PO) Take by mouth daily.      omeprazole (PRILOSEC) 20 MG capsule TAKE 1 CAPSULE BY MOUTH EVERY DAY 90 capsule 1   ONETOUCH DELICA LANCETS FINE MISC Check sugar once daily 50 each 12   ONETOUCH ULTRA test strip CHECK SUGAR ONCE DAILY  100 strip 11   pioglitazone (ACTOS) 45 MG tablet TAKE 1 TABLET BY MOUTH EVERY DAY 90 tablet 0   sertraline (ZOLOFT) 50 MG tablet TAKE 1 TABLET BY MOUTH EVERY DAY 90 tablet 3   No current facility-administered medications on file prior to visit.  Allergies  Allergen Reactions   Codeine Other (See Comments)    Pt went to sleep and had trouble waking up. Very sensitive to medication.   Lmx 4 [Lidocaine]       Review of Systems ROS Review of Systems - General ROS: negative for - chills, fatigue, fever, hot flashes, night sweats, weight gain or weight loss Psychological ROS: negative for - anxiety, decreased libido, depression, mood swings, physical abuse or sexual abuse Ophthalmic ROS: negative for - blurry vision, eye pain or loss of vision ENT ROS: negative for - headaches, hearing change, visual changes or vocal changes Allergy and Immunology ROS: negative for - hives, itchy/watery eyes or seasonal allergies Hematological and Lymphatic ROS: negative for - bleeding problems, bruising, swollen lymph nodes or weight loss Endocrine ROS: negative for - galactorrhea, hair pattern changes, hot flashes, malaise/lethargy, mood swings, palpitations, polydipsia/polyuria, skin changes, temperature intolerance or unexpected weight changes Breast ROS: negative for - new or changing breast lumps or nipple discharge Respiratory ROS: negative for - cough or shortness of breath Cardiovascular ROS: negative for - chest pain, irregular heartbeat, palpitations or shortness of breath Gastrointestinal ROS: no abdominal pain, change in bowel habits, or black or bloody stools Genito-Urinary ROS: no dysuria, trouble voiding, or hematuria Musculoskeletal ROS: negative for - joint pain or joint stiffness Neurological ROS: negative for - bowel and bladder control changes Dermatological ROS: negative for rash and skin lesion changes   Objective:   There were no vitals taken for this visit. CONSTITUTIONAL:  Well-developed, well-nourished female in no acute distress.  PSYCHIATRIC: Normal mood and affect. Normal behavior. Normal judgment and thought content. [place]: Alert and oriented to person, place, and time. Normal muscle tone coordination. No cranial nerve deficit noted. HENT:  Normocephalic, atraumatic, External right and left ear normal. Oropharynx is clear and moist EYES: Conjunctivae and EOM are normal. Pupils are equal, round, and reactive to light. No scleral icterus.  NECK: Normal range of motion, supple, no masses.  Normal thyroid.  SKIN: Skin is warm and dry. No rash noted. Not diaphoretic. No erythema. No pallor. CARDIOVASCULAR: Normal heart rate noted, regular rhythm, no murmur. RESPIRATORY: Clear to auscultation bilaterally. Effort and breath sounds normal, no problems with respiration noted. BREASTS: Symmetric in size. No masses, skin changes, nipple drainage, or lymphadenopathy. ABDOMEN: Soft, normal bowel sounds, no distention noted.  No tenderness, rebound or guarding.  BLADDER: Normal PELVIC:  Bladder {:311640}  Urethra: {:311719}  Vulva: {:311722}  Vagina: {:311643}  Cervix: {:311644}  Uterus: {:311718}  Adnexa: {:311645}  RV: {Blank multiple:19196::"External Exam NormaI","No Rectal Masses","Normal Sphincter tone"}  MUSCULOSKELETAL: Normal range of motion. No tenderness.  No cyanosis, clubbing, or edema.  2+ distal pulses. LYMPHATIC: No Axillary, Supraclavicular, or Inguinal Adenopathy.   Labs: Lab Results  Component Value Date   WBC 5.6 04/27/2022   HGB 13.4 04/27/2022   HCT 40.2 04/27/2022   MCV 96 04/27/2022   PLT 153 04/27/2022    Lab Results  Component Value Date   CREATININE 0.86 04/27/2022   BUN 8 04/27/2022   NA 139 04/27/2022   K 4.1 04/27/2022   CL 102 04/27/2022   CO2 24 04/27/2022    Lab Results  Component Value Date   ALT 24 04/27/2022   AST 20 04/27/2022   ALKPHOS 125 (H) 04/27/2022   BILITOT 0.6 04/27/2022    Lab Results   Component Value Date   CHOL 77 (L) 12/02/2020   HDL 47 12/02/2020   LDLCALC 16 12/02/2020   TRIG  58 12/02/2020   CHOLHDL 1.6 12/02/2020    Lab Results  Component Value Date   TSH 1.430 04/27/2022    Lab Results  Component Value Date   HGBA1C 9.6 (A) 04/27/2022     Assessment:   No diagnosis found.   Plan:  Pap: Not needed Mammogram: Ordered Colon Screening:  Not Indicated Labs:  UTD, done by PCP Routine preventative health maintenance measures emphasized: Exercise/Diet/Weight control, Tobacco Warnings, Alcohol/Substance use risks, Stress Management, Peer Pressure Issues, and Safe Sex COVID Vaccination status: Return to [place], MD [facility]

## 2022-06-22 NOTE — Progress Notes (Signed)
Virtual Visit via Telephone Note  I connected with  Anna Williams on 06/22/22 at  9:30 AM EDT by telephone and verified that I am speaking with the correct person using two identifiers.  Location: Patient: home Provider: [facility] Persons participating in the virtual visit: [place]   I discussed the limitations, risks, security and privacy concerns of performing an evaluation and management service by telephone and the availability of in person appointments. The patient expressed understanding and agreed to proceed.  Interactive audio and video telecommunications were attempted between this nurse and patient, however failed, due to patient having technical difficulties OR patient did not have access to video capability.  We continued and completed visit with audio only.  Some vital signs may be absent or patient reported.   Dionisio David, LPN  Subjective:   Anna Williams is a 76 y.o. female who presents for Medicare Annual (Subsequent) preventive examination.  Review of Systems           Objective:    There were no vitals filed for this visit. There is no height or weight on file to calculate BMI.     12/18/2021    2:19 PM 01/01/2021   10:18 AM 03/10/2020   11:00 PM 03/10/2020    5:54 PM 07/09/2019    1:49 PM 12/15/2017   11:07 AM 09/13/2016    8:51 AM  Advanced Directives  Does Patient Have a Medical Advance Directive? No Yes No No No Yes No  Type of Corporate treasurer of [place];Living will    Living will   Copy of [place] in Chart?  No - copy requested       Would patient like information on creating a medical advance directive?   No - Patient declined  Yes (MAU/Ambulatory/Procedural Areas - Information given)      Current Medications (verified) Outpatient Encounter Medications as of 06/22/2022  Medication Sig   amLODipine (NORVASC) 5 MG tablet TAKE 1 TABLET BY MOUTH EVERY DAY   atorvastatin (LIPITOR) 40 MG  tablet TAKE 1 TABLET BY MOUTH EVERY DAY   BD PEN NEEDLE NANO 2ND GEN 32G X 4 MM MISC USE AS DIRECTED   Calcium Citrate-Vitamin D (CALCIUM + D PO) Take 1 tablet by mouth daily.   cholestyramine (QUESTRAN) 4 g packet Take 1 packet (4 g total) by mouth 2 (two) times daily.   cloNIDine (CATAPRES) 0.1 MG tablet SMARTSIG:1 Tablet(s) By Mouth   DUREZOL 0.05 % EMUL Place 1 drop into the right eye 3 (three) times daily.   gatifloxacin (ZYMAXID) 0.5 % SOLN    glucose blood test strip OneTouch Ultra Test strips   insulin glargine, 1 Unit Dial, (TOUJEO SOLOSTAR) 300 UNIT/ML Solostar Pen Inject 5 Units into the skin daily.   meloxicam (MOBIC) 7.5 MG tablet Take 7.5 mg by mouth daily.   memantine (NAMENDA) 5 MG tablet TAKE 1 TABLET BY MOUTH TWICE A DAY   Multiple Vitamins-Minerals (PRESERVISION/LUTEIN PO) Take by mouth daily.    omeprazole (PRILOSEC) 20 MG capsule TAKE 1 CAPSULE BY MOUTH EVERY DAY   ONETOUCH DELICA LANCETS FINE MISC Check sugar once daily   ONETOUCH ULTRA test strip CHECK SUGAR ONCE DAILY   pioglitazone (ACTOS) 45 MG tablet TAKE 1 TABLET BY MOUTH EVERY DAY   sertraline (ZOLOFT) 50 MG tablet TAKE 1 TABLET BY MOUTH EVERY DAY   No facility-administered encounter medications on file as of 06/22/2022.    Allergies (verified) Codeine and  Lmx 4 [lidocaine]   History: Past Medical History:  Diagnosis Date   Anxiety and depression    ASCUS with positive high risk HPV 09/2013   Cataract    Diabetes mellitus without complication (HCC)    type 2   Elevated blood sugar    Family history of breast cancer in mother    Fibroid uterus    History of pulmonary embolus (PE)    Hypertension    Menopause    Mild dysplasia of cervix 09/2013   repeat pap done -ecc neg, cerival bx cin 1   Retinal detachment    Stroke [facility])    Past Surgical History:  Procedure Laterality Date   anterior neck fusion     BREAST CYST ASPIRATION Left yrs ago   CHOLECYSTECTOMY  10/11/2008   DILATION AND CURETTAGE  OF UTERUS     EYE SURGERY     HYSTEROSCOPY     mole removed  10/11/2012   RETINAL DETACHMENT SURGERY     Family History  Problem Relation Age of Onset   Breast cancer Mother 46       reccurence age 48   Heart disease Father    Throat cancer Brother    Heart disease Brother    Lung cancer Brother        with mets to brain   Breast cancer Maternal Aunt    Colon cancer Neg Hx    Ovarian cancer Neg Hx    Social History   Socioeconomic History   Marital status: Married    Spouse name: Not on file   Number of children: 2   Years of education: Not on file   Highest education level: Associate degree: occupational, Hotel manager, or vocational program  Occupational History   Occupation: retired  Tobacco Use   Smoking status: Never   Smokeless tobacco: Never  Vaping Use   Vaping Use: Never used  Substance and Sexual Activity   Alcohol use: No   Drug use: No   Sexual activity: Not Currently    Birth control/protection: Post-menopausal  Other Topics Concern   Not on file  Social History Narrative   Not on file   Social Determinants of Health   Financial Resource Strain: Low Risk  (01/01/2021)   Overall Financial Resource Strain (CARDIA)    Difficulty of Paying Living Expenses: Not hard at all  Food Insecurity: No Food Insecurity (01/01/2021)   Hunger Vital Sign    Worried About Running Out of Food in the Last Year: Never true    Ran Out of Food in the Last Year: Never true  Transportation Needs: No Transportation Needs (01/01/2021)   PRAPARE - Hydrologist (Medical): No    Lack of Transportation (Non-Medical): No  Physical Activity: Inactive (01/01/2021)   Exercise Vital Sign    Days of Exercise per Week: 0 days    Minutes of Exercise per Session: 0 min  Stress: No Stress Concern Present (01/01/2021)   [place]    Feeling of Stress : Only a little  Social Connections: Socially  Integrated (01/01/2021)   Social Connection and Isolation Panel [NHANES]    Frequency of Communication with Friends and Family: More than three times a week    Frequency of Social Gatherings with Friends and Family: More than three times a week    Attends Religious Services: More than 4 times per year    Active Member of Clubs or  Organizations: Yes    Attends Music therapist: More than 4 times per year    Marital Status: Married    Tobacco Counseling Counseling given: Not Answered   Clinical Intake:  Pre-visit preparation completed: Yes  Pain : No/denies pain     Nutritional Risks: None Diabetes: Yes CBG done?: No Did pt. bring in CBG monitor from home?: No  How often do you need to have someone help you when you read instructions, pamphlets, or other written materials from your doctor or pharmacy?: 1 - Never  Diabetic?yes Nutrition Risk Assessment:  Has the patient had any N/V/D within the last 2 months?  No  Does the patient have any non-healing wounds?  No  Has the patient had any unintentional weight loss or weight gain?  No   Diabetes:  Is the patient diabetic?  Yes  If diabetic, was a CBG obtained today?  No  Did the patient bring in their glucometer from home?  No  How often do you monitor your CBG's? Once/day.   Financial Strains and Diabetes Management:  Are you having any financial strains with the device, your supplies or your medication? No .  Does the patient want to be seen by Chronic Care Management for management of their diabetes?  No  Would the patient like to be referred to a Nutritionist or for Diabetic Management?  No   Diabetic Exams:  Diabetic Eye Exam: Completed 11/05/21. Marland Kitchen Pt has been advised about the importance in completing this exam.  Diabetic Foot Exam: Completed 05/25/17. Pt has been advised about the importance in completing this exam.   Interpreter Needed?: No  Information entered by :: Kirke Shaggy,  LPN   Activities of Daily Living    01/20/2022    1:39 PM 12/22/2021   11:24 AM  In your present state of health, do you have any difficulty performing the following activities:  Hearing? 1 1  Vision? 0 0  Difficulty concentrating or making decisions? 0 1  Walking or climbing stairs? 1 1  Dressing or bathing? 0 0  Doing errands, shopping? 1 0    Patient Care Team: Jerrol Banana., MD as PCP - General (Family Medicine) Lorelee Cover., MD as Consulting Physician (Ophthalmology) Hayden Pedro, MD as Consulting Physician (Ophthalmology) Rubie Maid, MD as Referring Physician (Obstetrics and Gynecology)  Indicate any recent Medical Services you may have received from other than [facility] providers in the past year (date may be approximate).     Assessment:   This is a routine wellness examination for Anna Williams.  Hearing/Vision screen No results found.  Dietary issues and exercise activities discussed:     Goals Addressed   None    Depression Screen    01/20/2022    1:38 PM 12/22/2021   11:23 AM 04/27/2021    4:00 PM 01/01/2021   10:16 AM 09/17/2020    4:27 PM 06/30/2020    1:42 PM 06/30/2020   12:15 PM  PHQ 2/9 Scores  PHQ - 2 Score 1 0 0 0 0 4 2  PHQ- 9 Score '6 9 4  1 17 16    '$ Fall Risk    01/20/2022    1:38 PM 12/22/2021   11:23 AM 01/01/2021   10:18 AM 07/09/2019    1:50 PM 12/11/2018    2:51 PM  Fall Risk   Falls in the past year? '1 1 1 '$ 0 0  Number falls in past yr: 1 0 0 0  Injury with Fall? 1 0 0 0   Risk for fall due to : History of fall(s);Impaired balance/gait;Impaired mobility;Medication side effect;Mental status change      Follow up Falls evaluation completed  Falls prevention discussed      FALL RISK PREVENTION PERTAINING TO THE HOME:  Any stairs in or around the home? Yes  If so, are there any without handrails? No  Home free of loose throw rugs in walkways, pet beds, electrical cords, etc? Yes  Adequate lighting in your home to reduce risk  of falls? Yes   ASSISTIVE DEVICES UTILIZED TO PREVENT FALLS:  Life alert? No  Use of a cane, walker or w/c? No  Grab bars in the bathroom? No  Shower chair or bench in shower? No  Elevated toilet seat or a handicapped toilet? No    Cognitive Function:    04/27/2022    4:18 PM 12/22/2021   11:36 AM 03/02/2021   10:47 AM 06/30/2020   12:08 PM  MMSE - Mini Mental State Exam  Orientation to time '5 5 4 4  '$ Orientation to Place '5 5 5 5  '$ Registration '3 3 3 3  '$ Attention/ Calculation '5 4 3 3  '$ Recall '3 3 2 3  '$ Language- name 2 objects '2 2 2 2  '$ Language- repeat '1 1 1 1  '$ Language- follow 3 step command '3 3 3 3  '$ Language- read & follow direction '1 1 1 1  '$ Write a sentence '1 1 1 1  '$ Copy design '1 1 1 1  '$ Total score '30 29 26 27        '$ 09/13/2016    9:07 AM  6CIT Screen  What Year? 0 points  What month? 0 points  What time? 0 points  Count back from 20 0 points  Months in reverse 0 points  Repeat phrase 0 points  Total Score 0 points    Immunizations Immunization History  Administered Date(s) Administered   Fluad Quad(high Dose 65+) 09/17/2020, 06/29/2021   Influenza, High Dose Seasonal PF 09/03/2015, 06/29/2016, 09/13/2017, 08/28/2018   PFIZER(Purple Top)SARS-COV-2 Vaccination 11/14/2019, 12/05/2019, 07/10/2020   Pneumococcal Conjugate-13 09/03/2015   Pneumococcal Polysaccharide-23 09/13/2017   Zoster Recombinat (Shingrix) 07/02/2019, 04/12/2020    TDAP status: Due, Education has been provided regarding the importance of this vaccine. Advised may receive this vaccine at local pharmacy or Health Dept. Aware to provide a copy of the vaccination record if obtained from local pharmacy or Health Dept. Verbalized acceptance and understanding.  Flu Vaccine status: Up to date  Pneumococcal vaccine status: Up to date  Covid-19 vaccine status: Completed vaccines  Qualifies for Shingles Vaccine? Yes   Zostavax completed No   Shingrix Completed?: Yes  Screening Tests Health  Maintenance  Topic Date Due   Hepatitis C Screening  Never done   FOOT EXAM  05/25/2018   INFLUENZA VACCINE  05/11/2022   TETANUS/TDAP  02/02/2023 (Originally 06/03/1965)   COVID-19 Vaccine (4 - Pfizer series) 02/02/2023 (Originally 09/04/2020)   MAMMOGRAM  07/27/2022   HEMOGLOBIN A1C  10/28/2022   OPHTHALMOLOGY EXAM  11/05/2022   Diabetic kidney evaluation - Urine ACR  12/23/2022   Diabetic kidney evaluation - GFR measurement  04/28/2023   Pneumonia Vaccine 53+ Years old  Completed   DEXA SCAN  Completed   Zoster Vaccines- Shingrix  Completed   HPV VACCINES  Aged Out   COLONOSCOPY (Pts 45-56yr Insurance coverage will need to be confirmed)  Discontinued    Health Maintenance  Health Maintenance Due  Topic Date Due   Hepatitis C Screening  Never done   FOOT EXAM  05/25/2018   INFLUENZA VACCINE  05/11/2022    Colorectal cancer screening: No longer required.   Mammogram status: No longer required due to age.  Bone Density status: Completed 07/18/18. Results reflect: Bone density results: NORMAL. Repeat every 5 years.  Lung Cancer Screening: (Low Dose CT Chest recommended if Age 74-80 years, 30 pack-year currently smoking OR have quit w/in 15years.) does not qualify.    Additional Screening:  Hepatitis C Screening: does qualify; Completed no  Vision Screening: Recommended annual ophthalmology exams for early detection of glaucoma and other disorders of the eye. Is the patient up to date with their annual eye exam?  Yes  Who is the provider or what is the name of the office in which the patient attends annual eye exams? Dr.Bell If pt is not established with a provider, would they like to be referred to a provider to establish care? No .   Dental Screening: Recommended annual dental exams for proper oral hygiene  Community Resource Referral / Chronic Care Management: CRR required this visit?  No   CCM required this visit?  No      Plan:     I have personally  reviewed and noted the following in the patient's chart:   Medical and social history Use of alcohol, tobacco or illicit drugs  Current medications and supplements including opioid prescriptions. Patient is not currently taking opioid prescriptions. Functional ability and status Nutritional status Physical activity Advanced directives List of other physicians Hospitalizations, surgeries, and ER visits in previous 12 months Vitals Screenings to include cognitive, depression, and falls Referrals and appointments  In addition, I have reviewed and discussed with patient certain preventive protocols, quality metrics, and best practice recommendations. A written personalized care plan for preventive services as well as general preventive health recommendations were provided to patient.     Dionisio David, LPN   [id number]/4503   Nurse Notes: none

## 2022-06-22 NOTE — Patient Instructions (Signed)
Anna Williams , Thank you for taking time to come for your Medicare Wellness Visit. I appreciate your ongoing commitment to your health goals. Please review the following plan we discussed and let me know if I can assist you in the future.   Screening recommendations/referrals: Colonoscopy: aged out  Mammogram: aged out Bone Density: 07/18/18 Recommended yearly ophthalmology/optometry visit for glaucoma screening and checkup Recommended yearly dental visit for hygiene and checkup  Vaccinations: Influenza vaccine: 06/29/21 Pneumococcal vaccine: 09/13/17 Tdap vaccine: n/d Shingles vaccine: Shingrix 07/02/19, 04/12/20   Covid-19:11/14/19, 12/05/19, 07/10/20  Advanced directives: no  Conditions/risks identified: none  Next appointment: Follow up in one year for your annual wellness visit - declined   Preventive Care 25 Years and Older, Female Preventive care refers to lifestyle choices and visits with your health care provider that can promote health and wellness. What does preventive care include? A yearly physical exam. This is also called an annual well check. Dental exams once or twice a year. Routine eye exams. Ask your health care provider how often you should have your eyes checked. Personal lifestyle choices, including: Daily care of your teeth and gums. Regular physical activity. Eating a healthy diet. Avoiding tobacco and drug use. Limiting alcohol use. Practicing safe sex. Taking low-dose aspirin every day. Taking vitamin and mineral supplements as recommended by your health care provider. What happens during an annual well check? The services and screenings done by your health care provider during your annual well check will depend on your age, overall health, lifestyle risk factors, and family history of disease. Counseling  Your health care provider may ask you questions about your: Alcohol use. Tobacco use. Drug use. Emotional well-being. Home and relationship  well-being. Sexual activity. Eating habits. History of falls. Memory and ability to understand (cognition). Work and work Statistician. Reproductive health. Screening  You may have the following tests or measurements: Height, weight, and BMI. Blood pressure. Lipid and cholesterol levels. These may be checked every 5 years, or more frequently if you are over 67 years old. Skin check. Lung cancer screening. You may have this screening every year starting at age 33 if you have a 30-pack-year history of smoking and currently smoke or have quit within the past 15 years. Fecal occult blood test (FOBT) of the stool. You may have this test every year starting at age 38. Flexible sigmoidoscopy or colonoscopy. You may have a sigmoidoscopy every 5 years or a colonoscopy every 10 years starting at age 70. Hepatitis C blood test. Hepatitis B blood test. Sexually transmitted disease (STD) testing. Diabetes screening. This is done by checking your blood sugar (glucose) after you have not eaten for a while (fasting). You may have this done every 1-3 years. Bone density scan. This is done to screen for osteoporosis. You may have this done starting at age 61. Mammogram. This may be done every 1-2 years. Talk to your health care provider about how often you should have regular mammograms. Talk with your health care provider about your test results, treatment options, and if necessary, the need for more tests. Vaccines  Your health care provider may recommend certain vaccines, such as: Influenza vaccine. This is recommended every year. Tetanus, diphtheria, and acellular pertussis (Tdap, Td) vaccine. You may need a Td booster every 10 years. Zoster vaccine. You may need this after age 47. Pneumococcal 13-valent conjugate (PCV13) vaccine. One dose is recommended after age 13. Pneumococcal polysaccharide (PPSV23) vaccine. One dose is recommended after age 58. Talk to your health  care provider about which  screenings and vaccines you need and how often you need them. This information is not intended to replace advice given to you by your health care provider. Make sure you discuss any questions you have with your health care provider. Document Released: 10/24/2015 Document Revised: 06/16/2016 Document Reviewed: 07/29/2015 Elsevier Interactive Patient Education  2017 [place] Prevention in the Home Falls can cause injuries. They can happen to people of all ages. There are many things you can do to make your home safe and to help prevent falls. What can I do on the outside of my home? Regularly fix the edges of walkways and driveways and fix any cracks. Remove anything that might make you trip as you walk through a door, such as a raised step or threshold. Trim any bushes or trees on the path to your home. Use bright outdoor lighting. Clear any walking paths of anything that might make someone trip, such as rocks or tools. Regularly check to see if handrails are loose or broken. Make sure that both sides of any steps have handrails. Any raised decks and porches should have guardrails on the edges. Have any leaves, snow, or ice cleared regularly. Use sand or salt on walking paths during winter. Clean up any spills in your garage right away. This includes oil or grease spills. What can I do in the bathroom? Use night lights. Install grab bars by the toilet and in the tub and shower. Do not use towel bars as grab bars. Use non-skid mats or decals in the tub or shower. If you need to sit down in the shower, use a plastic, non-slip stool. Keep the floor dry. Clean up any water that spills on the floor as soon as it happens. Remove soap buildup in the tub or shower regularly. Attach bath mats securely with double-sided non-slip rug tape. Do not have throw rugs and other things on the floor that can make you trip. What can I do in the bedroom? Use night lights. Make sure that you have a  light by your bed that is easy to reach. Do not use any sheets or blankets that are too big for your bed. They should not hang down onto the floor. Have a firm chair that has side arms. You can use this for support while you get dressed. Do not have throw rugs and other things on the floor that can make you trip. What can I do in the kitchen? Clean up any spills right away. Avoid walking on wet floors. Keep items that you use a lot in easy-to-reach places. If you need to reach something above you, use a strong step stool that has a grab bar. Keep electrical cords out of the way. Do not use floor polish or wax that makes floors slippery. If you must use wax, use non-skid floor wax. Do not have throw rugs and other things on the floor that can make you trip. What can I do with my stairs? Do not leave any items on the stairs. Make sure that there are handrails on both sides of the stairs and use them. Fix handrails that are broken or loose. Make sure that handrails are as long as the stairways. Check any carpeting to make sure that it is firmly attached to the stairs. Fix any carpet that is loose or worn. Avoid having throw rugs at the top or bottom of the stairs. If you do have throw rugs, attach them to  the floor with carpet tape. Make sure that you have a light switch at the top of the stairs and the bottom of the stairs. If you do not have them, ask someone to add them for you. What else can I do to help prevent falls? Wear shoes that: Do not have high heels. Have rubber bottoms. Are comfortable and fit you well. Are closed at the toe. Do not wear sandals. If you use a stepladder: Make sure that it is fully opened. Do not climb a closed stepladder. Make sure that both sides of the stepladder are locked into place. Ask someone to hold it for you, if possible. Clearly mark and make sure that you can see: Any grab bars or handrails. First and last steps. Where the edge of each step  is. Use tools that help you move around (mobility aids) if they are needed. These include: Canes. Walkers. Scooters. Crutches. Turn on the lights when you go into a dark area. Replace any light bulbs as soon as they burn out. Set up your furniture so you have a clear path. Avoid moving your furniture around. If any of your floors are uneven, fix them. If there are any pets around you, be aware of where they are. Review your medicines with your doctor. Some medicines can make you feel dizzy. This can increase your chance of falling. Ask your doctor what other things that you can do to help prevent falls. This information is not intended to replace advice given to you by your health care provider. Make sure you discuss any questions you have with your health care provider. Document Released: 07/24/2009 Document Revised: 03/04/2016 Document Reviewed: 11/01/2014 Elsevier Interactive Patient Education  2017 [employer].

## 2022-06-23 ENCOUNTER — Encounter: Payer: Self-pay | Admitting: Obstetrics and Gynecology

## 2022-06-23 ENCOUNTER — Ambulatory Visit (INDEPENDENT_AMBULATORY_CARE_PROVIDER_SITE_OTHER): Payer: Medicare Other | Admitting: Obstetrics and Gynecology

## 2022-06-23 VITALS — BP 125/71 | HR 71 | Resp 16 | Ht 67.0 in | Wt 151.8 lb

## 2022-06-23 DIAGNOSIS — N816 Rectocele: Secondary | ICD-10-CM

## 2022-06-23 DIAGNOSIS — Z01419 Encounter for gynecological examination (general) (routine) without abnormal findings: Secondary | ICD-10-CM | POA: Diagnosis not present

## 2022-06-23 DIAGNOSIS — E119 Type 2 diabetes mellitus without complications: Secondary | ICD-10-CM | POA: Diagnosis not present

## 2022-06-23 DIAGNOSIS — I10 Essential (primary) hypertension: Secondary | ICD-10-CM | POA: Diagnosis not present

## 2022-06-23 DIAGNOSIS — Z809 Family history of malignant neoplasm, unspecified: Secondary | ICD-10-CM | POA: Diagnosis not present

## 2022-06-23 DIAGNOSIS — Z1231 Encounter for screening mammogram for malignant neoplasm of breast: Secondary | ICD-10-CM | POA: Diagnosis not present

## 2022-06-23 DIAGNOSIS — N8110 Cystocele, unspecified: Secondary | ICD-10-CM | POA: Diagnosis not present

## 2022-06-23 DIAGNOSIS — N393 Stress incontinence (female) (male): Secondary | ICD-10-CM

## 2022-06-25 ENCOUNTER — Other Ambulatory Visit: Payer: Self-pay | Admitting: Obstetrics and Gynecology

## 2022-06-25 DIAGNOSIS — Z1231 Encounter for screening mammogram for malignant neoplasm of breast: Secondary | ICD-10-CM

## 2022-06-27 ENCOUNTER — Other Ambulatory Visit: Payer: Self-pay | Admitting: Family Medicine

## 2022-06-27 DIAGNOSIS — E119 Type 2 diabetes mellitus without complications: Secondary | ICD-10-CM

## 2022-07-12 ENCOUNTER — Other Ambulatory Visit: Payer: Self-pay | Admitting: Family Medicine

## 2022-07-21 ENCOUNTER — Encounter: Payer: Self-pay | Admitting: Obstetrics and Gynecology

## 2022-07-21 ENCOUNTER — Ambulatory Visit (INDEPENDENT_AMBULATORY_CARE_PROVIDER_SITE_OTHER): Payer: Medicare Other | Admitting: Obstetrics and Gynecology

## 2022-07-21 VITALS — BP 118/69 | HR 81 | Resp 16 | Ht 67.0 in | Wt 150.0 lb

## 2022-07-21 DIAGNOSIS — N3000 Acute cystitis without hematuria: Secondary | ICD-10-CM

## 2022-07-21 DIAGNOSIS — N393 Stress incontinence (female) (male): Secondary | ICD-10-CM

## 2022-07-21 DIAGNOSIS — N8110 Cystocele, unspecified: Secondary | ICD-10-CM

## 2022-07-21 DIAGNOSIS — R351 Nocturia: Secondary | ICD-10-CM | POA: Diagnosis not present

## 2022-07-21 DIAGNOSIS — N816 Rectocele: Secondary | ICD-10-CM | POA: Diagnosis not present

## 2022-07-21 NOTE — Progress Notes (Signed)
    GYNECOLOGY PROGRESS NOTE  Subjective:    Patient ID: Anna Williams, female    DOB: [birth year], 76 y.o.   MRN: [id number]  HPI  Patient is a 76 y.o. G41P2002 female who presents for pessary fitting. She complains of urinary incontinence x 1.5 years. Reports leaking with coughing, sneezing.  Also notes bladder leakage at night when sleeping. Is now wearing a urinary pad.   Of note, patient reports that she is currently receiving treatment with antibiotics for a recently diagnosed UTI.  Is currently on day 3 of her treatment.  Is on a sulfa antibiotic, but thinks that it may be causing her fatigue.    The following portions of the patient's history were reviewed and updated as appropriate: allergies, current medications, past family history, past medical history, past social history, past surgical history, and problem list.  Review of Systems Pertinent items noted in HPI and remainder of comprehensive ROS otherwise negative.   Objective:   Blood pressure 118/69, pulse 81, resp. rate 16, height '5\' 7"'$  (1.702 m), weight 150 lb (68 kg).  Body mass index is 23.49 kg/m. General appearance: alert and no distress Abdomen: soft, non-tender; bowel sounds normal; no masses,  no organomegaly PELVIC:             Bladder: no bladder distension noted             Urethra: normal appearing urethra with no masses, tenderness or lesions             Vulva: normal appearing vulva with no masses, tenderness or lesions             Vagina: atrophic and Pelvic Floor Exam cystocele Grade 2, rectocele Grade 1. No leakage of urine on Valsalva.             Cervix: normal appearing cervix without discharge or lesions             Uterus: uterus is normal size, shape, consistency and nontender             Adnexa: normal adnexa in size, nontender and no masses   Assessment:   1. Cystocele with rectocele   2. Stress incontinence in female   3. Nocturia   4. Acute cystitis without hematuria      Plan:    Pessary fitting performed today.  Will order size 2 ring with support and knob. Advised to continue antibiotics until completion for current UTI. Patient will follow-up in the next 2 to 3 weeks once pessary arrives for insertion.   A total of 35 minutes were spent during this encounter, including review of previous progress notes, recent labs, face-to-face with time with patient involving counseling and coordination of care, as well as documentation for current visit.    Rubie Maid, MD [facility]

## 2022-07-28 ENCOUNTER — Ambulatory Visit
Admission: RE | Admit: 2022-07-28 | Discharge: 2022-07-28 | Disposition: A | Discharge status: Home / Self Care | Payer: Medicare Other | Source: Ambulatory Visit | Attending: Obstetrics and Gynecology | Admitting: Obstetrics and Gynecology

## 2022-07-28 DIAGNOSIS — Z1231 Encounter for screening mammogram for malignant neoplasm of breast: Secondary | ICD-10-CM | POA: Insufficient documentation

## 2022-08-09 ENCOUNTER — Other Ambulatory Visit: Payer: Self-pay | Admitting: Family Medicine

## 2022-08-24 ENCOUNTER — Telehealth: Payer: Self-pay | Admitting: Obstetrics and Gynecology

## 2022-08-24 NOTE — Telephone Encounter (Signed)
Patient is scheduled for 09/07/22 with Dr. Marcelline Mates. Patient has been added to cancellation list

## 2022-08-24 NOTE — Telephone Encounter (Signed)
-----   Message from [place], LPN sent at 49/97/1820  8:34 AM EST ----- Regarding: RE: pessary Pessary arrived. Please schedule appointment for placement. ----- Message ----- From: Rubie Maid, MD Sent: 07/22/2022   7:54 PM EST To: Otila Kluver, LPN; Chilton [place]athouse, CMA Subject: pessary                                        Please order pessary, Size 2 ring with support and knob.   Dr. Marcelline Mates

## 2022-09-06 NOTE — Progress Notes (Unsigned)
    GYNECOLOGY PROGRESS NOTE  Subjective:    Patient ID: Anna Williams, female    DOB: 01-02-1946, 76 y.o.   MRN: [id number]  HPI  Patient is a 76 y.o. G85P2002 female who presents for pessary insertion.  She has a h/o urinary incontinence x 1.5 years and small cystocele with rectocele. Reported leaking with coughing, sneezing.  Also noting bladder leakage at night when sleeping. Is now wearing a urinary pad.  She was fitted for a size 2 ring with support and knob pessary at her previous visit.  She has some new concerns with her feet swelling x 1 week. Her left foot is worse than the right foot.  Has noticed for the past week.  Denies any recent prolonged standing, denies pain of extremities.  Denies SOB, chest pain.   The following portions of the patient's history were reviewed and updated as appropriate: allergies, current medications, past family history, past medical history, past social history, past surgical history, and problem list.  Review of Systems Pertinent items are noted in HPI.   Objective:   Blood pressure 135/65, pulse 80, resp. rate 16, height '5\' 7"'$  (1.702 m), weight 150 lb 11.2 oz (68.4 kg). Body mass index is 23.6 kg/m. General appearance: alert, cooperative, and no distress Pelvic: exam deferred, refer to previous exam on 07/21/22 performed by me.  Size 2 ring with support and knob  inserted.  Extremities: +1 to+2 pitting edema present bilaterally up to mid-calf, right slightly greater than left. Dusky appearance of all toes bilaterally.      Assessment:   1. Encounter for fitting and adjustment of pessary   2. Cystocele with rectocele   3. Stress incontinence in female   4. Vaginal atrophy   5. Bilateral edema of lower extremity      Plan:  Pessary inserted today without difficulty.  Discussed maintenance. Will f/u in 3-4 weeks to reassess symptoms.  Unclear cause of bilateral edema. Notes she has been using compression stockings for the past 2 days.  Discussed use of OTC diuretic for 1-2 days to remove fluid.  If no improvement, advised to notify MD.    Rubie Maid, MD [place]

## 2022-09-07 ENCOUNTER — Encounter: Payer: Self-pay | Admitting: Obstetrics and Gynecology

## 2022-09-07 ENCOUNTER — Ambulatory Visit: Payer: Medicare Other | Admitting: Obstetrics and Gynecology

## 2022-09-07 VITALS — BP 135/65 | HR 80 | Resp 16 | Ht 67.0 in | Wt 150.7 lb

## 2022-09-07 DIAGNOSIS — R600 Localized edema: Secondary | ICD-10-CM

## 2022-09-07 DIAGNOSIS — N393 Stress incontinence (female) (male): Secondary | ICD-10-CM

## 2022-09-07 DIAGNOSIS — N952 Postmenopausal atrophic vaginitis: Secondary | ICD-10-CM | POA: Diagnosis not present

## 2022-09-07 DIAGNOSIS — N8110 Cystocele, unspecified: Secondary | ICD-10-CM

## 2022-09-07 DIAGNOSIS — Z4689 Encounter for fitting and adjustment of other specified devices: Secondary | ICD-10-CM | POA: Diagnosis not present

## 2022-09-07 DIAGNOSIS — N816 Rectocele: Secondary | ICD-10-CM

## 2022-09-08 ENCOUNTER — Other Ambulatory Visit: Payer: Self-pay

## 2022-09-08 ENCOUNTER — Other Ambulatory Visit: Payer: Self-pay | Admitting: Gastroenterology

## 2022-09-08 ENCOUNTER — Ambulatory Visit: Payer: Medicare Other | Admitting: Gastroenterology

## 2022-09-08 ENCOUNTER — Encounter: Payer: Self-pay | Admitting: Gastroenterology

## 2022-09-08 ENCOUNTER — Ambulatory Visit: Payer: Medicare Other | Admitting: Family Medicine

## 2022-09-08 VITALS — BP 130/77 | HR 88 | Temp 98.4°F | Ht 67.0 in | Wt 153.0 lb

## 2022-09-08 DIAGNOSIS — K529 Noninfective gastroenteritis and colitis, unspecified: Secondary | ICD-10-CM

## 2022-09-08 MED ORDER — NA SULFATE-K SULFATE-MG SULF 17.5-3.13-1.6 GM/177ML PO SOLN: 354.0000 mL | Freq: Once | ORAL | 0 refills | Status: AC

## 2022-09-08 NOTE — Progress Notes (Signed)
Cephas Darby, MD [address]  [place]  [place], [place] [address]  Main: [phone]  Fax: [phone]    Gastroenterology Consultation  Referring Provider:     Jerrol Banana.,* Primary Care Physician:  Jerrol Banana., MD Primary Gastroenterologist:  Dr. Cephas Darby Reason for Consultation: Chronic diarrhea        HPI:   Anna Williams is a 76 y.o. female referred by Dr. Rosanna Randy, Retia Passe., MD  for consultation & management of chronic diarrhea.  Patient has been experiencing intermittent episodes of mushy to watery bowel movements about 4-5 times daily.  She recently had an episode of fecal incontinence when she was at the mall which was quite embarrassing for her.  She generally wears depends.  She has not tried any dietary modification.  Her weight has been stable.  She denies any abdominal bloating, nausea, abdominal pain.  She does have diabetes which is poorly controlled.  She does not smoke or drink alcohol  NSAIDs: None  Antiplts/Anticoagulants/Anti thrombotics: None  GI Procedures: Colonoscopy in 2016  Past Medical History:  Diagnosis Date   Anxiety and depression    ASCUS with positive high risk HPV 09/2013   Cataract    Diabetes mellitus without complication (HCC)    type 2   Elevated blood sugar    Family history of breast cancer in mother    Fibroid uterus    History of pulmonary embolus (PE)    Hypertension    Menopause    Mild dysplasia of cervix 09/2013   repeat pap done -ecc neg, cerival bx cin 1   Retinal detachment    Stroke [facility])     Past Surgical History:  Procedure Laterality Date   anterior neck fusion     BREAST CYST ASPIRATION Left yrs ago   CHOLECYSTECTOMY  10/11/2008   DILATION AND CURETTAGE OF UTERUS     EYE SURGERY     HYSTEROSCOPY     mole removed  10/11/2012   RETINAL DETACHMENT SURGERY       Current Outpatient Medications:    amLODipine (NORVASC) 5 MG tablet, TAKE 1 TABLET BY MOUTH  EVERY DAY, Disp: 90 tablet, Rfl: 1   atorvastatin (LIPITOR) 40 MG tablet, TAKE 1 TABLET BY MOUTH EVERY DAY, Disp: 90 tablet, Rfl: 0   BD PEN NEEDLE NANO 2ND GEN 32G X 4 MM MISC, USE AS DIRECTED, Disp: 100 each, Rfl: 11   Calcium Citrate-Vitamin D (CALCIUM + D PO), Take 1 tablet by mouth daily., Disp: , Rfl:    glucose blood test strip, OneTouch Ultra Test strips, Disp: , Rfl:    insulin glargine, 1 Unit Dial, (TOUJEO SOLOSTAR) 300 UNIT/ML Solostar Pen, Inject 5 Units into the skin daily., Disp: 1.5 mL, Rfl: 0   meloxicam (MOBIC) 7.5 MG tablet, Take 7.5 mg by mouth daily., Disp: , Rfl:    memantine (NAMENDA) 5 MG tablet, TAKE 1 TABLET BY MOUTH TWICE A DAY, Disp: 180 tablet, Rfl: 1   Multiple Vitamins-Minerals (PRESERVISION/LUTEIN PO), Take by mouth daily., Disp: , Rfl:    Na Sulfate-K Sulfate-Mg Sulf 17.5-3.13-1.6 GM/177ML SOLN, Take 354 mLs by mouth once for 1 dose., Disp: 354 mL, Rfl: 0   omeprazole (PRILOSEC) 20 MG capsule, TAKE 1 CAPSULE BY MOUTH EVERY DAY, Disp: 90 capsule, Rfl: 1   ONETOUCH DELICA LANCETS FINE MISC, Check sugar once daily, Disp: 50 each, Rfl: 12   ONETOUCH ULTRA test strip, CHECK SUGAR ONCE  DAILY, Disp: 100 strip, Rfl: 11   pioglitazone (ACTOS) 45 MG tablet, TAKE 1 TABLET BY MOUTH EVERY DAY, Disp: 90 tablet, Rfl: 0   Family History  Problem Relation Age of Onset   Breast cancer Mother 88       reccurence age 37   Heart disease Father    Throat cancer Brother    Heart disease Brother    Lung cancer Brother        with mets to brain   Breast cancer Maternal Aunt    Colon cancer Neg Hx    Ovarian cancer Neg Hx      Social History   Tobacco Use   Smoking status: Never   Smokeless tobacco: Never  Vaping Use   Vaping Use: Never used  Substance Use Topics   Alcohol use: No   Drug use: No    Allergies as of 09/08/2022 - Review Complete 09/08/2022  Allergen Reaction Noted   Codeine Other (See Comments) 05/30/2014   Lmx 4 [lidocaine]  04/28/2015    Review  of Systems:    All systems reviewed and negative except where noted in HPI.   Physical Exam:  BP 130/77 (BP Location: Right Arm, Patient Position: Sitting, Cuff Size: Normal)   Pulse 88   Temp 98.4 F (36.9 C) (Oral)   Ht '5\' 7"'$  (1.702 m)   Wt 153 lb (69.4 kg)   BMI 23.96 kg/m  No LMP recorded. Patient is postmenopausal.  General:   Alert,  Well-developed, well-nourished, pleasant and cooperative in NAD Head:  Normocephalic and atraumatic. Eyes:  Sclera clear, no icterus.   Conjunctiva pink. Ears:  Normal auditory acuity. Nose:  No deformity, discharge, or lesions. Mouth:  No deformity or lesions,oropharynx pink & moist. Neck:  Supple; no masses or thyromegaly. Lungs:  Respirations even and unlabored.  Clear throughout to auscultation.   No wheezes, crackles, or rhonchi. No acute distress. Heart:  Regular rate and rhythm; no murmurs, clicks, rubs, or gallops. Abdomen:  Normal bowel sounds. Soft, non-tender and non-distended without masses, hepatosplenomegaly or hernias noted.  No guarding or rebound tenderness.   Rectal: Not performed Msk:  Symmetrical without gross deformities. Good, equal movement & strength bilaterally. Pulses:  Normal pulses noted. Extremities:  No clubbing or edema.  No cyanosis. Neurologic:  Alert and oriented x3;  grossly normal neurologically. Skin:  Intact without significant lesions or rashes. No jaundice. Psych:  Alert and cooperative. Normal mood and affect.  Imaging Studies: No abdominal imaging  Assessment and Plan:   Anna Williams is a 76 y.o. female with history of poorly controlled diabetes is seen in consultation for approximately 1 year history of nonbloody diarrhea without any weight loss, rectal bleeding, abdominal pain or abdominal bloating  Recommend GI profile PCR Recommend upper endoscopy and colonoscopy for further evaluation Recommend to check pancreatic fecal elastase levels if above workup is unremarkable   Follow up in 3 to  4 months or sooner if needed   Cephas Darby, MD

## 2022-09-13 ENCOUNTER — Telehealth: Payer: Self-pay

## 2022-09-13 ENCOUNTER — Ambulatory Visit: Payer: Medicare Other | Admitting: Family Medicine

## 2022-09-13 NOTE — Telephone Encounter (Signed)
Patient is calling because she collected the stool same Friday at lunch and her husband tried to return the sample to our office but we were closed. Asked if they lab tech gave her the lab hours when the kit was given to her and she said no. Patient states her husband took it to a [facility] corp. She asked when they would be back with the results informed her it takes a couple days to process. Patient states she needs to reschedule her procedure to January because she has to much going on. Reschedule to January 3rd. Sent new instructions and informed trish in endo

## 2022-09-14 LAB — GI PROFILE, STOOL, PCR

## 2022-09-15 ENCOUNTER — Telehealth: Payer: Self-pay

## 2022-09-15 NOTE — Telephone Encounter (Signed)
Patient verbalized understanding of results  

## 2022-09-15 NOTE — Telephone Encounter (Signed)
-----   Message from Lin Landsman, MD sent at 09/14/2022 10:02 AM EST ----- Stool studies came back negative for infection, will see her for the egd and colonoscopy as scheduled  RV

## 2022-09-21 ENCOUNTER — Ambulatory Visit: Payer: Medicare Other | Admitting: Physician Assistant

## 2022-10-08 ENCOUNTER — Other Ambulatory Visit: Payer: Self-pay | Admitting: Family Medicine

## 2022-10-08 DIAGNOSIS — I10 Essential (primary) hypertension: Secondary | ICD-10-CM

## 2022-10-08 DIAGNOSIS — E119 Type 2 diabetes mellitus without complications: Secondary | ICD-10-CM

## 2022-10-08 MED ORDER — PIOGLITAZONE HCL 45 MG PO TABS: 45.0000 mg | ORAL_TABLET | Freq: Every day | ORAL | 0 refills | Status: AC

## 2022-10-08 MED ORDER — AMLODIPINE BESYLATE 5 MG PO TABS: 5.0000 mg | ORAL_TABLET | Freq: Every day | ORAL | 0 refills | Status: AC

## 2022-10-08 NOTE — Telephone Encounter (Signed)
The patient would like to speak with a member of clinical staff regarding their request further when possible  Please contact when available

## 2022-10-08 NOTE — Telephone Encounter (Signed)
Medication Refill - Medication: amLODipine (NORVASC) 5 MG tablet , pioglitazone (ACTOS) 45 MG tablet   Has the patient contacted their pharmacy? Yes.     Preferred Pharmacy (with phone number or street name[facility]  [id number] [place] [phone] Fax: [phone]    Has the patient been seen for an appointment in the last year OR does the patient have an upcoming appointment? Yes.    The patient is completely out and was told by the pharmacy they sent a request in twice to get these refilled. The patient needs these as soon as possible. Please assist patient further as she is concerned because she had a previous stroke and doesn't want to be without these meds

## 2022-10-08 NOTE — Telephone Encounter (Signed)
Patient called, left VM to return the call to the office to schedule OV for refills. She will need a TOC to another provider, if she is still wanting to stay at [facility]. If not, let her know we will route to a provider for approval of refills.

## 2022-10-08 NOTE — Telephone Encounter (Signed)
Pt yelled and hung up on the agent after being told that an appt would be needed for future refills with another provider. Pt states that she did not understand why an appt was needed and she was sick with the flu. Please advise

## 2022-10-08 NOTE — Telephone Encounter (Signed)
Requested medication (s) are due for refill today: Yes  Requested medication (s) are on the active medication list: Yes  Last refill:  02/25/22  Future visit scheduled: No  Notes to clinic:  Unable to refill per protocol, appointment needed.      Requested Prescriptions  Pending Prescriptions Disp Refills   amLODipine (NORVASC) 5 MG tablet 90 tablet 1    Sig: Take 1 tablet (5 mg total) by mouth daily.     Cardiovascular: Calcium Channel Blockers 2 Passed - 10/08/2022  2:52 PM      Passed - Last BP in normal range    BP Readings from Last 1 Encounters:  09/08/22 130/77         Passed - Last Heart Rate in normal range    Pulse Readings from Last 1 Encounters:  09/08/22 88         Passed - Valid encounter within last 6 months    Recent Outpatient Visits           5 months ago Type 2 diabetes mellitus with diabetic nephropathy, with long-term current use of insulin ([place])   [facility] Jerrol Banana., MD   8 months ago Cerebrovascular accident (CVA), unspecified mechanism [facility])   [facility] Jerrol Banana., MD   9 months ago Diabetes mellitus type 2 without retinopathy [facility])   [place], Erin E, PA-C   1 year ago Diabetes mellitus type 2 without retinopathy [facility])   [facility] Jerrol Banana., MD   1 year ago Diabetes mellitus type 2 without retinopathy [facility])   [facility] Jerrol Banana., MD       Future Appointments             In 2 months Vanga, Tally Due, MD [facility]             pioglitazone (ACTOS) 45 MG tablet 90 tablet 0    Sig: Take 1 tablet (45 mg total) by mouth daily.     Endocrinology:  Diabetes - Glitazones - pioglitazone Failed - 10/08/2022  2:52 PM      Failed - HBA1C is between 0 and 7.9 and within 180 days    Hemoglobin A1C  Date Value Ref Range Status  04/27/2022 9.6 (A) 4.0 - 5.6 % Final   Hgb A1c MFr Bld   Date Value Ref Range Status  12/02/2020 12.3 (H) 4.8 - 5.6 % Final    Comment:             Prediabetes: 5.7 - 6.4          Diabetes: >6.4          Glycemic control for adults with diabetes: <7.0          Passed - Valid encounter within last 6 months    Recent Outpatient Visits           5 months ago Type 2 diabetes mellitus with diabetic nephropathy, with long-term current use of insulin [facility])   [facility] Jerrol Banana., MD   8 months ago Cerebrovascular accident (CVA), unspecified mechanism [facility])   [facility] Jerrol Banana., MD   9 months ago Diabetes mellitus type 2 without retinopathy [facility])   [place], Erin E, PA-C   1 year ago Diabetes mellitus type 2 without retinopathy [facility])   [facility] Jerrol Banana., MD  1 year ago Diabetes mellitus type 2 without retinopathy [facility])   [facility] Jerrol Banana., MD       Future Appointments             In 2 months Vanga, Tally Due, MD [place]

## 2022-10-12 ENCOUNTER — Encounter: Payer: Self-pay | Admitting: Gastroenterology

## 2022-10-12 ENCOUNTER — Telehealth: Payer: Self-pay

## 2022-10-12 NOTE — Telephone Encounter (Signed)
Patent states she has been calling all day today and left message to cancel her colonoscopy because she is sick with a upper respiratory infection and is on antibiotics. She asked why no one has called her back. She states she called ENDO and cancel the procedure. Asked front staff and they said they did not have a message from her. Asked patient if she wanted to reschedule she states yes gave her dates the end of the month and she is going to call me back which date she wants when she feels better. Gave patient my direct number to call me back

## 2022-10-13 ENCOUNTER — Encounter: Admission: RE | Payer: Self-pay | Source: Ambulatory Visit

## 2022-10-13 ENCOUNTER — Ambulatory Visit: Payer: Medicare Other | Admitting: Physician Assistant

## 2022-10-13 ENCOUNTER — Ambulatory Visit: Admission: RE | Admit: 2022-10-13 | Payer: Medicare Other | Source: Ambulatory Visit | Admitting: Gastroenterology

## 2022-10-13 SURGERY — COLONOSCOPY WITH PROPOFOL
Anesthesia: General

## 2022-10-29 ENCOUNTER — Other Ambulatory Visit: Payer: Self-pay

## 2022-10-29 ENCOUNTER — Telehealth: Payer: Self-pay | Admitting: Gastroenterology

## 2022-10-29 DIAGNOSIS — K529 Noninfective gastroenteritis and colitis, unspecified: Secondary | ICD-10-CM

## 2022-10-29 NOTE — Telephone Encounter (Signed)
Called patient and patient reschedule to 11/16/2022 with Dr. Marius Ditch. Sent new instructions to patient and put in new orders for patient. She state she already has prep at the home

## 2022-10-29 NOTE — Progress Notes (Deleted)
    GYNECOLOGY PROGRESS NOTE  Subjective:    Patient ID: Anna Williams, female    DOB: 1946-05-03, 77 y.o.   MRN: [id number]  HPI  Patient is a 77 y.o. G68P2002 female who presents for pessary check. She has a h/o urinary incontinence x 1.5 years and small cystocele with rectocele. Reported leaking with coughing, sneezing.  Also noting bladder leakage at night when sleeping. Is now wearing a urinary pad.  She was fitted for a size 2 ring with support and knob pessary.   {Common ambulatory SmartLinks:19316}  Review of Systems {ros; complete:30496}   Objective:   There were no vitals taken for this visit. There is no height or weight on file to calculate BMI. General appearance: {general exam:16600} Abdomen: {abdominal exam:16834} Pelvic: {pelvic exam:16852::"cervix normal in appearance","external genitalia normal","no adnexal masses or tenderness","no cervical motion tenderness","rectovaginal septum normal","uterus normal size, shape, and consistency","vagina normal without discharge"} Extremities: {extremity exam:5109} Neurologic: {neuro exam:17854}   Assessment:   No diagnosis found.   Plan:   There are no diagnoses linked to this encounter.      Rubie Maid, MD [place]

## 2022-10-29 NOTE — Telephone Encounter (Signed)
Patient calling to reschedule colonoscopy and wanting to know if she can go ahead and get an upper endoscopy with that.

## 2022-11-01 ENCOUNTER — Other Ambulatory Visit: Payer: Self-pay | Admitting: Family Medicine

## 2022-11-01 DIAGNOSIS — E119 Type 2 diabetes mellitus without complications: Secondary | ICD-10-CM

## 2022-11-01 MED ORDER — TOUJEO SOLOSTAR 300 UNIT/ML ~~LOC~~ SOPN: 5.0000 [IU] | PEN_INJECTOR | Freq: Every day | SUBCUTANEOUS | 0 refills | Status: DC

## 2022-11-01 NOTE — Telephone Encounter (Signed)
Requested Prescriptions  Pending Prescriptions Disp Refills   insulin glargine, 1 Unit Dial, (TOUJEO SOLOSTAR) 300 UNIT/ML Solostar Pen 1.5 mL 0    Sig: Inject 5 Units into the skin daily.     Endocrinology:  Diabetes - Insulins Failed - 11/01/2022 10:30 AM      Failed - HBA1C is between 0 and 7.9 and within 180 days    Hemoglobin A1C  Date Value Ref Range Status  04/27/2022 9.6 (A) 4.0 - 5.6 % Final   Hgb A1c MFr Bld  Date Value Ref Range Status  12/02/2020 12.3 (H) 4.8 - 5.6 % Final    Comment:             Prediabetes: 5.7 - 6.4          Diabetes: >6.4          Glycemic control for adults with diabetes: <7.0          Passed - Valid encounter within last 6 months    Recent Outpatient Visits           6 months ago Type 2 diabetes mellitus with diabetic nephropathy, with long-term current use of insulin ([place])   [place] Jerrol Banana., MD   9 months ago Cerebrovascular accident (CVA), unspecified mechanism ([place])   [place] Jerrol Banana., MD   10 months ago Diabetes mellitus type 2 without retinopathy [facility])   [place], Dani Gobble, PA-C   1 year ago Diabetes mellitus type 2 without retinopathy [facility])   [place] Jerrol Banana., MD   1 year ago Diabetes mellitus type 2 without retinopathy [facility])   [place] Jerrol Banana., MD       Future Appointments             In 1 week Simmons-Robinson, Riki Sheer, MD [facility] [place]   In 2 months Vanga, Tally Due, MD [facility]

## 2022-11-01 NOTE — Telephone Encounter (Signed)
Patient's husband called and advised she will need an appointment with new provider Dr. Quentin Cornwall in order to receive refills. He asked would a refill be sent to day or will she need to wait, because she needs a refill. Advised the refill will be given today and she come to the scheduled appointment to transfer care to Dr. Quentin Cornwall, he verbalized understanding, appointment scheduled.

## 2022-11-01 NOTE — Telephone Encounter (Signed)
Medication Refill - Medication: insulin glargine, 1 Unit Dial, (TOUJEO SOLOSTAR) 300 UNIT/ML Solostar Pen / pt is completely out of this medication and realized this last night / pts husband refused appt   Has the patient contacted their pharmacy? No (Agent: If no, request that the patient contact the pharmacy for the refill. If patient does not wish to contact the pharmacy document the reason why and proceed with request.) (Agent: If yes, when and what did the pharmacy advise?) Preferred Pharmacy (with phone number or street name):  [id number] [place]    Has the patient been seen for an appointment in the last year OR does the patient have an upcoming appointment? Yes / last appt was 7.18.23 with Dr. GRosanna Randy Agent: Please be advised that RX refills may take up to 3 business days. We ask that you follow-up with your pharmacy.

## 2022-11-02 ENCOUNTER — Ambulatory Visit: Payer: Medicare Other | Admitting: Obstetrics and Gynecology

## 2022-11-05 NOTE — Progress Notes (Unsigned)
Established patient visit   Patient: Anna Williams   DOB: 1945/12/30   77 y.o. Female  MRN: [id number] Visit Date: 11/08/2022  Today's healthcare provider: Eulis Foster, MD   No chief complaint on file.  Subjective    HPI  Diabetes Mellitus Type II, Follow-up  Lab Results  Component Value Date   HGBA1C 9.6 (A) 04/27/2022   HGBA1C 8.5 (A) 12/22/2021   HGBA1C 8.2 (A) 06/29/2021   Wt Readings from Last 3 Encounters:  09/08/22 153 lb (69.4 kg)  09/07/22 150 lb 11.2 oz (68.4 kg)  07/21/22 150 lb (68 kg)   Last seen for diabetes 6 months ago.  Management since then includes none.Patient on maximum dose pioglitazone. She reports {excellent/good/fair/poor:19665} compliance with treatment.  Symptoms: {Yes/No:20286} fatigue {Yes/No:20286} foot ulcerations  {Yes/No:20286} appetite changes {Yes/No:20286} nausea  {Yes/No:20286} paresthesia of the feet  {Yes/No:20286} polydipsia  {Yes/No:20286} polyuria {Yes/No:20286} visual disturbances   {Yes/No:20286} vomiting     Home blood sugar records: {diabetes glucometry results:16657}  Episodes of hypoglycemia? {Yes/No:20286} {enter symptoms and frequency of symptoms if yes:1}   Current insulin regiment: TOUJEO SOLOSTAR) 300 UNIT/ML Solostar Pen Inject 5 Units into the skin daily.  Most Recent Eye Exam: ***   Pertinent Labs: Lab Results  Component Value Date   CHOL 77 (L) 12/02/2020   HDL 47 12/02/2020   LDLCALC 16 12/02/2020   TRIG 58 12/02/2020   CHOLHDL 1.6 12/02/2020   Lab Results  Component Value Date   NA 139 04/27/2022   K 4.1 04/27/2022   CREATININE 0.86 04/27/2022   EGFR 70 04/27/2022   LABMICR 5.6 12/22/2021   MICRALBCREAT 6 12/22/2021     ---------------------------------------------------------------------------------------------------   Medications: Outpatient Medications Prior to Visit  Medication Sig   amLODipine (NORVASC) 5 MG tablet Take 1 tablet (5 mg total) by mouth daily.  Please schedule office visit before any future refill.   atorvastatin (LIPITOR) 40 MG tablet TAKE 1 TABLET BY MOUTH EVERY DAY   BD PEN NEEDLE NANO 2ND GEN 32G X 4 MM MISC USE AS DIRECTED   Calcium Citrate-Vitamin D (CALCIUM + D PO) Take 1 tablet by mouth daily.   glucose blood test strip OneTouch Ultra Test strips   insulin glargine, 1 Unit Dial, (TOUJEO SOLOSTAR) 300 UNIT/ML Solostar Pen Inject 5 Units into the skin daily.   meloxicam (MOBIC) 7.5 MG tablet Take 7.5 mg by mouth daily.   memantine (NAMENDA) 5 MG tablet TAKE 1 TABLET BY MOUTH TWICE A DAY   Multiple Vitamins-Minerals (PRESERVISION/LUTEIN PO) Take by mouth daily.   omeprazole (PRILOSEC) 20 MG capsule TAKE 1 CAPSULE BY MOUTH EVERY DAY   ONETOUCH DELICA LANCETS FINE MISC Check sugar once daily   ONETOUCH ULTRA test strip CHECK SUGAR ONCE DAILY   pioglitazone (ACTOS) 45 MG tablet Take 1 tablet (45 mg total) by mouth daily. Please schedule office visit before any future refill.   No facility-administered medications prior to visit.    Review of Systems  {Labs  Heme  Chem  Endocrine  Serology  Results Review (optional):23779}   Objective    There were no vitals taken for this visit. {Show previous vital signs (optional):23777}  Physical Exam  ***  No results found for any visits on 11/08/22.  Assessment & Plan     ***  No follow-ups on file.      {provider attestation***:1}   Eulis Foster, MD  [facility] [phone] (phone) [phone] (fax)  [facility]

## 2022-11-08 ENCOUNTER — Encounter: Payer: Self-pay | Admitting: Family Medicine

## 2022-11-08 ENCOUNTER — Ambulatory Visit (INDEPENDENT_AMBULATORY_CARE_PROVIDER_SITE_OTHER): Payer: Medicare Other | Admitting: Family Medicine

## 2022-11-08 VITALS — BP 127/75 | HR 69 | Temp 98.5°F | Resp 16 | Ht 67.0 in | Wt 147.6 lb

## 2022-11-08 DIAGNOSIS — Z23 Encounter for immunization: Secondary | ICD-10-CM | POA: Diagnosis not present

## 2022-11-08 DIAGNOSIS — E119 Type 2 diabetes mellitus without complications: Secondary | ICD-10-CM

## 2022-11-08 MED ORDER — TOUJEO SOLOSTAR 300 UNIT/ML ~~LOC~~ SOPN: 5.0000 [IU] | PEN_INJECTOR | Freq: Every day | SUBCUTANEOUS | 2 refills | Status: AC

## 2022-11-08 NOTE — Assessment & Plan Note (Signed)
Influenza vaccine was administered today  Patient tolerated well   

## 2022-11-08 NOTE — Assessment & Plan Note (Signed)
Stable  Repeat Hgb A1c  Refills provided for toujeo 5 units daily

## 2022-11-09 ENCOUNTER — Other Ambulatory Visit: Payer: Self-pay | Admitting: Family Medicine

## 2022-11-09 LAB — HEMOGLOBIN A1C
Est. average glucose Bld gHb Est-mCnc: 214 mg/dL
Hgb A1c MFr Bld: 9.1 % — ABNORMAL HIGH (ref 4.8–5.6)

## 2022-11-10 NOTE — Progress Notes (Unsigned)
     GYNECOLOGY PROGRESS NOTE  Subjective:    Patient ID: Anna Williams, female    DOB: August 05, 1946, 77 y.o.   MRN: [id number]  HPI  Patient is a 77 y.o. G54P2002 female who presents for pessary check. She has a h/o urinary incontinence x 1.5 years and small cystocele with rectocele. Currently has a size 2 ring with support and knob pessary in place. She reports no vaginal bleeding or discharge. She denies pelvic discomfort and difficulty urinating or moving her bowels.  Thinks she may be developing a UTI. Is having more urinary urgency, only has small amounts of urine when she goes to empty her bladder. Does note she has not been drinking as much water as she should, has been drinking more Coke's lately. Patient also with a h/o diabetes, notes her sugars have been a little elevated more recently.   The following portions of the patient's history were reviewed and updated as appropriate: allergies, current medications, past family history, past medical history, past social history, past surgical history, and problem list.  Review of Systems Pertinent items are noted in HPI.   Objective:   Blood pressure (!) 139/59, pulse 77, resp. rate 16, height '5\' 7"'$  (1.702 m), weight 146 lb 11.2 oz (66.5 kg). Body mass index is 22.98 kg/m. General appearance: alert, cooperative, and no distress Pelvic: ?The patient's Size 2 knob ring with support pessary was removed, cleaned and replaced without complications. Speculum examination revealed normal vaginal mucosa with no lesions or lacerations.   Labs:  Results for orders placed or performed in visit on 11/12/22  POCT Urinalysis Dipstick  Result Value Ref Range   Color, UA     Clarity, UA     Glucose, UA Positive (A) Negative   Bilirubin, UA Negative    Ketones, UA Negative    Spec Grav, UA 1.015 1.010 - 1.025   Blood, UA Trace    pH, UA 6.0 5.0 - 8.0   Protein, UA Negative Negative   Urobilinogen, UA 0.2 0.2 or 1.0 E.U./dL   Nitrite, UA  Negative    Leukocytes, UA Negative Negative   Appearance     Odor       Assessment:   1. Pessary maintenance   2. Urinary frequency   3. Cystocele with rectocele   4. Stress incontinence in female   5. Vaginal atrophy      Plan:  Pessary inserted today without difficulty.  Discussed maintenance. The patient should return in 3 months for a pessary check and continue to use vaginal estrogen cream weekly as prescribed.   Rubie Maid, MD [facility], MD [place]

## 2022-11-12 ENCOUNTER — Encounter: Payer: Self-pay | Admitting: Obstetrics and Gynecology

## 2022-11-12 ENCOUNTER — Ambulatory Visit: Payer: Medicare Other | Admitting: Obstetrics and Gynecology

## 2022-11-12 VITALS — BP 139/59 | HR 77 | Resp 16 | Ht 67.0 in | Wt 146.7 lb

## 2022-11-12 DIAGNOSIS — Z4689 Encounter for fitting and adjustment of other specified devices: Secondary | ICD-10-CM

## 2022-11-12 DIAGNOSIS — N952 Postmenopausal atrophic vaginitis: Secondary | ICD-10-CM

## 2022-11-12 DIAGNOSIS — N816 Rectocele: Secondary | ICD-10-CM

## 2022-11-12 DIAGNOSIS — R350 Frequency of micturition: Secondary | ICD-10-CM | POA: Diagnosis not present

## 2022-11-12 DIAGNOSIS — N393 Stress incontinence (female) (male): Secondary | ICD-10-CM | POA: Diagnosis not present

## 2022-11-12 DIAGNOSIS — N8110 Cystocele, unspecified: Secondary | ICD-10-CM

## 2022-11-12 LAB — POCT URINALYSIS DIPSTICK
Bilirubin, UA: NEGATIVE
Glucose, UA: POSITIVE — AB
Ketones, UA: NEGATIVE
Leukocytes, UA: NEGATIVE
Nitrite, UA: NEGATIVE
Protein, UA: NEGATIVE
Spec Grav, UA: 1.015 (ref 1.010–1.025)
Urobilinogen, UA: 0.2 E.U./dL
pH, UA: 6 (ref 5.0–8.0)

## 2022-11-12 MED ORDER — TRIMO-SAN 0.025 % VA GEL: 1.0000 | VAGINAL | 3 refills | Status: DC

## 2022-11-15 ENCOUNTER — Encounter: Payer: Self-pay | Admitting: Gastroenterology

## 2022-11-16 ENCOUNTER — Encounter: Payer: Self-pay | Admitting: Gastroenterology

## 2022-11-16 ENCOUNTER — Ambulatory Visit: Payer: Medicare Other | Admitting: Certified Registered"

## 2022-11-16 ENCOUNTER — Ambulatory Visit
Admission: RE | Admit: 2022-11-16 | Discharge: 2022-11-16 | Disposition: A | Discharge status: Home / Self Care | Payer: Medicare Other | Attending: Gastroenterology | Admitting: Gastroenterology

## 2022-11-16 ENCOUNTER — Encounter
Admission: RE | Disposition: A | Discharge status: Home / Self Care | Payer: Self-pay | Source: Home / Self Care | Attending: Gastroenterology

## 2022-11-16 DIAGNOSIS — R197 Diarrhea, unspecified: Secondary | ICD-10-CM | POA: Diagnosis not present

## 2022-11-16 DIAGNOSIS — K529 Noninfective gastroenteritis and colitis, unspecified: Secondary | ICD-10-CM

## 2022-11-16 DIAGNOSIS — K3189 Other diseases of stomach and duodenum: Secondary | ICD-10-CM | POA: Diagnosis not present

## 2022-11-16 DIAGNOSIS — K319 Disease of stomach and duodenum, unspecified: Secondary | ICD-10-CM | POA: Diagnosis not present

## 2022-11-16 HISTORY — PX: ESOPHAGOGASTRODUODENOSCOPY (EGD) WITH PROPOFOL: SHX5813

## 2022-11-16 HISTORY — PX: COLONOSCOPY WITH PROPOFOL: SHX5780

## 2022-11-16 LAB — GLUCOSE, CAPILLARY: Glucose-Capillary: 189 mg/dL — ABNORMAL HIGH (ref 70–99)

## 2022-11-16 SURGERY — COLONOSCOPY WITH PROPOFOL
Anesthesia: General

## 2022-11-16 MED ORDER — PROPOFOL 500 MG/50ML IV EMUL
INTRAVENOUS | Status: DC | PRN
  Administered 2022-11-16: 175 ug/kg/min via INTRAVENOUS

## 2022-11-16 MED ORDER — PROPOFOL 10 MG/ML IV BOLUS
INTRAVENOUS | Status: DC | PRN
  Administered 2022-11-16: 90 mg via INTRAVENOUS
  Administered 2022-11-16: 30 mg via INTRAVENOUS

## 2022-11-16 MED ORDER — PROPOFOL 1000 MG/100ML IV EMUL
INTRAVENOUS | Status: AC
  Filled 2022-11-16: qty 200

## 2022-11-16 MED ORDER — SODIUM CHLORIDE 0.9 % IV SOLN
INTRAVENOUS | Status: DC
  Administered 2022-11-16: 1000 mL via INTRAVENOUS

## 2022-11-16 NOTE — Transfer of Care (Signed)
Immediate Anesthesia Transfer of Care Note  Patient: Anna Williams  Procedure(s) Performed: COLONOSCOPY WITH PROPOFOL ESOPHAGOGASTRODUODENOSCOPY (EGD) WITH PROPOFOL  Patient Location: PACU  Anesthesia Type:General  Level of Consciousness: sedated  Airway & Oxygen Therapy: Patient Spontanous Breathing  Post-op Assessment: Report given to RN and Post -op Vital signs reviewed and stable  Post vital signs: Reviewed and stable  Last Vitals:  Vitals Value Taken Time  BP 109/52 11/16/22 0955  Temp 35.9 C 11/16/22 0955  Pulse 68 11/16/22 0956  Resp 12 11/16/22 0956  SpO2 100 % 11/16/22 0956  Vitals shown include unvalidated device data.  Last Pain:  Vitals:   11/16/22 0955  TempSrc: Temporal  PainSc: Asleep         Complications: No notable events documented.

## 2022-11-16 NOTE — Anesthesia Preprocedure Evaluation (Signed)
Anesthesia Evaluation  Patient identified by MRN, date of birth, ID band Patient awake    Reviewed: Allergy & Precautions, H&P , NPO status , Patient's Chart, lab work & pertinent test results, reviewed documented beta blocker date and time   History of Anesthesia Complications Negative for: history of anesthetic complications  Airway Mallampati: II  TM Distance: >3 FB Neck ROM: full  Mouth opening: Limited Mouth Opening  Dental  (+) Dental Advidsory Given, Partial Lower, Caps, Teeth Intact   Pulmonary neg pulmonary ROS   Pulmonary exam normal breath sounds clear to auscultation       Cardiovascular Exercise Tolerance: Good hypertension, (-) angina (-) Past MI and (-) Cardiac Stents Normal cardiovascular exam(-) dysrhythmias (-) Valvular Problems/Murmurs Rhythm:regular Rate:Normal     Neuro/Psych neg Seizures PSYCHIATRIC DISORDERS Anxiety Depression    CVA, No Residual Symptoms    GI/Hepatic negative GI ROS, Neg liver ROS,,,  Endo/Other  diabetes    Renal/GU negative Renal ROS  negative genitourinary   Musculoskeletal   Abdominal   Peds  Hematology negative hematology ROS (+)   Anesthesia Other Findings Past Medical History: No date: Anxiety and depression 09/2013: ASCUS with positive high risk HPV No date: Cataract No date: Diabetes mellitus without complication (HCC)     Comment:  type 2 No date: Elevated blood sugar No date: Family history of breast cancer in mother No date: Fibroid uterus No date: History of pulmonary embolus (PE) No date: Hypertension No date: Menopause 09/2013: Mild dysplasia of cervix     Comment:  repeat pap done -ecc neg, cerival bx cin 1 No date: Retinal detachment No date: Stroke ([place])   Reproductive/Obstetrics negative OB ROS                             Anesthesia Physical Anesthesia Plan  ASA: 2  Anesthesia Plan: General   Post-op Pain  Management:    Induction: Intravenous  PONV Risk Score and Plan: 3 and Propofol infusion and TIVA  Airway Management Planned: Natural Airway and Nasal Cannula  Additional Equipment:   Intra-op Plan:   Post-operative Plan:   Informed Consent: I have reviewed the patients History and Physical, chart, labs and discussed the procedure including the risks, benefits and alternatives for the proposed anesthesia with the patient or authorized representative who has indicated his/her understanding and acceptance.     Dental Advisory Given  Plan Discussed with: Anesthesiologist, CRNA and Surgeon  Anesthesia Plan Comments:         Anesthesia Quick Evaluation

## 2022-11-16 NOTE — Op Note (Signed)
[facility] Gastroenterology Patient Name: Anna Williams Procedure Date: 11/16/2022 9:01 AM MRN: [id number] Account #: [id number] Date of Birth: 1946/03/26 Admit Type: Outpatient Age: 77 Room: [facility] ENDO ROOM 3 Gender: Female Note Status: Finalized Instrument Name: Altamese Cabal Endoscope [id number] Procedure:             Upper GI endoscopy Indications:           Diarrhea Providers:             Lin Landsman MD, MD Referring MD:          Janine Ores. Rosanna Randy, MD (Referring MD) Medicines:             General Anesthesia Complications:         No immediate complications. Estimated blood loss: None. Procedure:             Pre-Anesthesia Assessment:                        - Prior to the procedure, a History and Physical was                         performed, and patient medications and allergies were                         reviewed. The patient is competent. The risks and                         benefits of the procedure and the sedation options and                         risks were discussed with the patient. All questions                         were answered and informed consent was obtained.                         Patient identification and proposed procedure were                         verified by the physician, the nurse, the                         anesthesiologist, the anesthetist and the technician                         in the pre-procedure area in the procedure room in the                         endoscopy suite. Mental Status Examination: alert and                         oriented. Airway Examination: normal oropharyngeal                         airway and neck mobility. Respiratory Examination:                         clear to auscultation. CV Examination: normal.  Prophylactic Antibiotics: The patient does not require                         prophylactic antibiotics. Prior Anticoagulants: The                         patient has taken no  anticoagulant or antiplatelet                         agents. ASA Grade Assessment: II - A patient with mild                         systemic disease. After reviewing the risks and                         benefits, the patient was deemed in satisfactory                         condition to undergo the procedure. The anesthesia                         plan was to use general anesthesia. Immediately prior                         to administration of medications, the patient was                         re-assessed for adequacy to receive sedatives. The                         heart rate, respiratory rate, oxygen saturations,                         blood pressure, adequacy of pulmonary ventilation, and                         response to care were monitored throughout the                         procedure. The physical status of the patient was                         re-assessed after the procedure.                        After obtaining informed consent, the endoscope was                         passed under direct vision. Throughout the procedure,                         the patient's blood pressure, pulse, and oxygen                         saturations were monitored continuously. The Endoscope                         was introduced through the mouth, and advanced to the  second part of duodenum. The upper GI endoscopy was                         accomplished without difficulty. The patient tolerated                         the procedure well. Findings:      The duodenal bulb and second portion of the duodenum were normal.       Biopsies were taken with a cold forceps for histology.      Diffuse mildly erythematous mucosa without bleeding was found in the       gastric antrum. Biopsies were taken with a cold forceps for histology.      The gastric body and incisura were normal. Biopsies were taken with a       cold forceps for histology.      The cardia and gastric  fundus were normal on retroflexion.      Esophagogastric landmarks were identified: the gastroesophageal junction       was found at 39 cm from the incisors.      The gastroesophageal junction and examined esophagus were normal. Impression:            - Normal duodenal bulb and second portion of the                         duodenum. Biopsied.                        - Erythematous mucosa in the antrum. Biopsied.                        - Normal gastric body and incisura. Biopsied.                        - Esophagogastric landmarks identified.                        - Normal gastroesophageal junction and esophagus. Recommendation:        - Await pathology results.                        - Proceed with colonoscopy as scheduled                        See colonoscopy report Procedure Code(s):     --- Professional ---                        [phone], Esophagogastroduodenoscopy, flexible,                         transoral; with biopsy, single or multiple Diagnosis Code(s):     --- Professional ---                        K31.89, Other diseases of stomach and duodenum                        R19.7, Diarrhea, unspecified CPT copyright 2022 American Medical Association. All rights reserved. The codes documented in this report are preliminary and upon coder review may  be revised to meet current compliance  requirements. Dr. Ulyess Mort Lin Landsman MD, MD 11/16/2022 9:30:24 AM This report has been signed electronically. Number of Addenda: 0 Note Initiated On: 11/16/2022 9:01 AM Estimated Blood Loss:  Estimated blood loss: none.      [facility]

## 2022-11-16 NOTE — Op Note (Signed)
[facility] Gastroenterology Patient Name: Anna Williams Procedure Date: 11/16/2022 9:01 AM MRN: [id number] Account #: [id number] Date of Birth: June 07, 1946 Admit Type: Outpatient Age: 77 Room: [facility] ENDO ROOM 3 Gender: Female Note Status: Finalized Instrument Name: Colonscope [id number] Procedure:             Colonoscopy Indications:           Last colonoscopy: September 2015, Chronic diarrhea Providers:             Lin Landsman MD, MD Referring MD:          Janine Ores. Rosanna Randy, MD (Referring MD) Medicines:             General Anesthesia Complications:         No immediate complications. Estimated blood loss: None. Procedure:             Pre-Anesthesia Assessment:                        - Prior to the procedure, a History and Physical was                         performed, and patient medications and allergies were                         reviewed. The patient is competent. The risks and                         benefits of the procedure and the sedation options and                         risks were discussed with the patient. All questions                         were answered and informed consent was obtained.                         Patient identification and proposed procedure were                         verified by the physician, the nurse, the                         anesthesiologist, the anesthetist and the technician                         in the pre-procedure area in the procedure room in the                         endoscopy suite. Mental Status Examination: alert and                         oriented. Airway Examination: normal oropharyngeal                         airway and neck mobility. Respiratory Examination:                         clear to auscultation. CV Examination: normal.  Prophylactic Antibiotics: The patient does not require                         prophylactic antibiotics. Prior Anticoagulants: The                          patient has taken no anticoagulant or antiplatelet                         agents. ASA Grade Assessment: II - A patient with mild                         systemic disease. After reviewing the risks and                         benefits, the patient was deemed in satisfactory                         condition to undergo the procedure. The anesthesia                         plan was to use general anesthesia. Immediately prior                         to administration of medications, the patient was                         re-assessed for adequacy to receive sedatives. The                         heart rate, respiratory rate, oxygen saturations,                         blood pressure, adequacy of pulmonary ventilation, and                         response to care were monitored throughout the                         procedure. The physical status of the patient was                         re-assessed after the procedure.                        After obtaining informed consent, the colonoscope was                         passed under direct vision. Throughout the procedure,                         the patient's blood pressure, pulse, and oxygen                         saturations were monitored continuously. The                         Colonoscope was introduced through the anus and  advanced to the 10 cm into the ileum. The colonoscopy                         was performed without difficulty. The patient                         tolerated the procedure well. The quality of the bowel                         preparation was evaluated using the BBPS [facility] Bowel                         Preparation Scale) with scores of: Right Colon = 3,                         Transverse Colon = 3 and Left Colon = 3 (entire mucosa                         seen well with no residual staining, small fragments                         of stool or opaque liquid). The total BBPS score                          equals 9. The terminal ileum, ileocecal valve,                         appendiceal orifice, and rectum were photographed. Findings:      The perianal and digital rectal examinations were normal. Pertinent       negatives include normal sphincter tone and no palpable rectal lesions.      The terminal ileum appeared normal.      The colon (entire examined portion) appeared normal. Biopsies for       histology were taken with a cold forceps from the entire colon for       evaluation of microscopic colitis.      The retroflexed view of the distal rectum and anal verge was normal and       showed no anal or rectal abnormalities. Impression:            - The examined portion of the ileum was normal.                        - The entire examined colon is normal. Biopsied.                        - The distal rectum and anal verge are normal on                         retroflexion view. Recommendation:        - Discharge patient to home (with escort).                        - Resume previous diet today.                        - Continue present medications.                        -  Await pathology results.                        - Return to my office as previously scheduled. Procedure Code(s):     --- Professional ---                        [id number], Colonoscopy, flexible; with biopsy, single or                         multiple Diagnosis Code(s):     --- Professional ---                        K52.9, Noninfective gastroenteritis and colitis,                         unspecified CPT copyright 2022 American Medical Association. All rights reserved. The codes documented in this report are preliminary and upon coder review may  be revised to meet current compliance requirements. Dr. Ulyess Mort Lin Landsman MD, MD 11/16/2022 9:51:53 AM This report has been signed electronically. Number of Addenda: 0 Note Initiated On: 11/16/2022 9:01 AM Scope Withdrawal Time: 0 hours 12  minutes 56 seconds  Total Procedure Duration: 0 hours 17 minutes 45 seconds  Estimated Blood Loss:  Estimated blood loss: none.      [facility]

## 2022-11-16 NOTE — H&P (Signed)
Cephas Darby, MD [address]  [place]  [place], [place] [address]  Main: [phone]  Fax: [phone] Pager: [phone]  Primary Care Physician:  Eulas Post, MD Primary Gastroenterologist:  Dr. Cephas Darby  Pre-Procedure History & Physical: HPI:  Anna Williams is a 77 y.o. female is here for an endoscopy and colonoscopy.   Past Medical History:  Diagnosis Date   Anxiety and depression    ASCUS with positive high risk HPV 09/2013   Cataract    Diabetes mellitus without complication (HCC)    type 2   Elevated blood sugar    Family history of breast cancer in mother    Fibroid uterus    History of pulmonary embolus (PE)    Hypertension    Menopause    Mild dysplasia of cervix 09/2013   repeat pap done -ecc neg, cerival bx cin 1   Retinal detachment    Stroke [facility])     Past Surgical History:  Procedure Laterality Date   anterior neck fusion     BREAST CYST ASPIRATION Left yrs ago   CHOLECYSTECTOMY  10/11/2008   DILATION AND CURETTAGE OF UTERUS     EYE SURGERY     HYSTEROSCOPY     mole removed  10/11/2012   RETINAL DETACHMENT SURGERY      Prior to Admission medications   Medication Sig Start Date End Date Taking? Authorizing Provider  amLODipine (NORVASC) 5 MG tablet Take 1 tablet (5 mg total) by mouth daily. Please schedule office visit before any future refill. 10/08/22   Simmons-Robinson, Riki Sheer, MD  atorvastatin (LIPITOR) 40 MG tablet TAKE 1 TABLET BY MOUTH EVERY DAY 11/09/22   Simmons-Robinson, Makiera, MD  BD PEN NEEDLE NANO 2ND GEN 32G X 4 MM MISC USE AS DIRECTED 02/12/22   Eulas Post, MD  Calcium Citrate-Vitamin D (CALCIUM + D PO) Take 1 tablet by mouth daily.    [provider]  glucose blood test strip OneTouch Ultra Test strips    [provider]  insulin glargine, 1 Unit Dial, (TOUJEO SOLOSTAR) 300 UNIT/ML Solostar Pen Inject 5 Units into the skin daily. 11/08/22   Simmons-Robinson, Makiera, MD  meloxicam  (MOBIC) 7.5 MG tablet Take 7.5 mg by mouth daily. 04/26/22   [provider]  memantine (NAMENDA) 5 MG tablet TAKE 1 TABLET BY MOUTH TWICE A DAY 05/12/22   Eulas Post, MD  Multiple Vitamins-Minerals (PRESERVISION/LUTEIN PO) Take by mouth daily.    [provider]  omeprazole (PRILOSEC) 20 MG capsule TAKE 1 CAPSULE BY MOUTH EVERY DAY 06/15/22   Eulas Post, MD  [facility] LANCETS FINE MISC Check sugar once daily 05/13/15   Eulas Post, MD  [facility] ULTRA test strip CHECK SUGAR ONCE DAILY 02/26/22   Eulas Post, MD  OXYQUINOLONE SULFATE VAGINAL (TRIMO-SAN) 0.025 % GEL Place 1 Applicatorful vaginally once a week. At bedtime 11/12/22   Rubie Maid, MD  pioglitazone (ACTOS) 45 MG tablet Take 1 tablet (45 mg total) by mouth daily. Please schedule office visit before any future refill. 10/08/22   Simmons-Robinson, Riki Sheer, MD    Allergies as of 10/29/2022 - Review Complete 10/12/2022  Allergen Reaction Noted   Codeine Other (See Comments) 05/30/2014   Lmx 4 [lidocaine]  04/28/2015    Family History  Problem Relation Age of Onset   Breast cancer Mother 77       reccurence age 48   Heart disease Father    Throat  cancer Brother    Heart disease Brother    Lung cancer Brother        with mets to brain   Breast cancer Maternal Aunt    Colon cancer Neg Hx    Ovarian cancer Neg Hx     Social History   Socioeconomic History   Marital status: Married    Spouse name: Not on file   Number of children: 2   Years of education: Not on file   Highest education level: Associate degree: occupational, Hotel manager, or vocational program  Occupational History   Occupation: retired  Tobacco Use   Smoking status: Never   Smokeless tobacco: Never  Vaping Use   Vaping Use: Never used  Substance and Sexual Activity   Alcohol use: No   Drug use: No   Sexual activity: Not Currently    Birth control/protection: Post-menopausal  Other Topics Concern   Not on  file  Social History Narrative   Not on file   Social Determinants of Health   Financial Resource Strain: Low Risk  (06/22/2022)   Overall Financial Resource Strain (CARDIA)    Difficulty of Paying Living Expenses: Not hard at all  Food Insecurity: No Food Insecurity (06/22/2022)   Hunger Vital Sign    Worried About Running Out of Food in the Last Year: Never true    Blende in the Last Year: Never true  Transportation Needs: No Transportation Needs (06/22/2022)   PRAPARE - Hydrologist (Medical): No    Lack of Transportation (Non-Medical): No  Physical Activity: Sufficiently Active (06/22/2022)   Exercise Vital Sign    Days of Exercise per Week: 3 days    Minutes of Exercise per Session: 60 min  Stress: No Stress Concern Present (06/22/2022)   [place]    Feeling of Stress : Not at all  Social Connections: Moderately Integrated (06/22/2022)   Social Connection and Isolation Panel [NHANES]    Frequency of Communication with Friends and Family: More than three times a week    Frequency of Social Gatherings with Friends and Family: Once a week    Attends Religious Services: More than 4 times per year    Active Member of [employer] or Organizations: No    Attends Archivist Meetings: Never    Marital Status: Married  Human resources officer Violence: Not At Risk (06/22/2022)   Humiliation, Afraid, Rape, and Kick questionnaire    Fear of Current or Ex-Partner: No    Emotionally Abused: No    Physically Abused: No    Sexually Abused: No    Review of Systems: See HPI, otherwise negative ROS  Physical Exam: BP (!) 155/62   Pulse 74   Temp (!) 96.7 F (35.9 C) (Temporal)   Resp 18   Ht '5\' 7"'$  (1.702 m)   Wt 65 kg   SpO2 100%   BMI 22.44 kg/m  General:   Alert,  pleasant and cooperative in NAD Head:  Normocephalic and atraumatic. Neck:  Supple; no masses or thyromegaly. Lungs:   Clear throughout to auscultation.    Heart:  Regular rate and rhythm. Abdomen:  Soft, nontender and nondistended. Normal bowel sounds, without guarding, and without rebound.   Neurologic:  Alert and  oriented x4;  grossly normal neurologically.  Impression/Plan: Anna Williams is here for an endoscopy and colonoscopy to be performed for chronic diarrhea  Risks, benefits, limitations, and alternatives regarding  endoscopy and colonoscopy have been reviewed with the patient.  Questions have been answered.  All parties agreeable.   Sherri Sear, MD  11/16/2022, 8:55 AM

## 2022-11-17 ENCOUNTER — Encounter: Payer: Self-pay | Admitting: Gastroenterology

## 2022-11-17 DIAGNOSIS — M4696 Unspecified inflammatory spondylopathy, lumbar region: Secondary | ICD-10-CM | POA: Diagnosis not present

## 2022-11-17 DIAGNOSIS — M47814 Spondylosis without myelopathy or radiculopathy, thoracic region: Secondary | ICD-10-CM | POA: Diagnosis not present

## 2022-11-18 LAB — SURGICAL PATHOLOGY

## 2022-11-22 ENCOUNTER — Telehealth: Payer: Self-pay

## 2022-11-22 MED ORDER — MESALAMINE 1.2 G PO TBEC: 2.4000 g | DELAYED_RELEASE_TABLET | Freq: Two times a day (BID) | ORAL | 1 refills | Status: AC

## 2022-11-22 NOTE — Telephone Encounter (Signed)
-----   Message from Lin Landsman, MD sent at 11/20/2022  4:33 PM EST ----- Please inform patient that the pathology results from upper endoscopy came back normal.  Pathology results from colonoscopy shows she has mild active chronic colitis suggestive of ulcerative pancolitis, mild in severity.  This could explain her chronic diarrheal symptoms.  Recommend to start Lialda 2.4 g twice daily.  I should see her for follow-up as scheduled  Rohini Vanga

## 2022-11-22 NOTE — Telephone Encounter (Signed)
Patient verbalized understanding of results. Patient states someone from the hospital called her Friday to say all her results were normal. Informed her Dr. Marius Ditch just reviewed her results over the weekend and do not see anything documented. I told her I would inform my office manger of this but this is the results of her procedure. Sent medication to pharmacy

## 2022-12-01 DIAGNOSIS — M810 Age-related osteoporosis without current pathological fracture: Secondary | ICD-10-CM | POA: Diagnosis not present

## 2022-12-01 DIAGNOSIS — E7800 Pure hypercholesterolemia, unspecified: Secondary | ICD-10-CM | POA: Diagnosis not present

## 2022-12-01 DIAGNOSIS — Z8673 Personal history of transient ischemic attack (TIA), and cerebral infarction without residual deficits: Secondary | ICD-10-CM | POA: Diagnosis not present

## 2022-12-01 DIAGNOSIS — Z794 Long term (current) use of insulin: Secondary | ICD-10-CM | POA: Diagnosis not present

## 2022-12-01 DIAGNOSIS — Z86711 Personal history of pulmonary embolism: Secondary | ICD-10-CM | POA: Diagnosis not present

## 2022-12-01 DIAGNOSIS — G3184 Mild cognitive impairment, so stated: Secondary | ICD-10-CM | POA: Diagnosis not present

## 2022-12-01 DIAGNOSIS — E119 Type 2 diabetes mellitus without complications: Secondary | ICD-10-CM | POA: Diagnosis not present

## 2022-12-01 DIAGNOSIS — I10 Essential (primary) hypertension: Secondary | ICD-10-CM | POA: Diagnosis not present

## 2022-12-02 NOTE — Anesthesia Postprocedure Evaluation (Signed)
Anesthesia Post Note  Patient: Anna Williams  Procedure(s) Performed: COLONOSCOPY WITH PROPOFOL ESOPHAGOGASTRODUODENOSCOPY (EGD) WITH PROPOFOL  Patient location during evaluation: Endoscopy Anesthesia Type: General Level of consciousness: awake and alert Pain management: pain level controlled Vital Signs Assessment: post-procedure vital signs reviewed and stable Respiratory status: spontaneous breathing, nonlabored ventilation, respiratory function stable and patient connected to nasal cannula oxygen Cardiovascular status: blood pressure returned to baseline and stable Postop Assessment: no apparent nausea or vomiting Anesthetic complications: no   No notable events documented.   Last Vitals:  Vitals:   11/16/22 0955 11/16/22 1005  BP: (!) 109/52 (!) 121/57  Pulse:  69  Resp: 10 17  Temp: (!) 35.9 C   SpO2: 99% 100%    Last Pain:  Vitals:   11/16/22 1005  TempSrc:   PainSc: 0-No pain                 Martha Clan

## 2022-12-06 DIAGNOSIS — Z780 Asymptomatic menopausal state: Secondary | ICD-10-CM | POA: Diagnosis not present

## 2022-12-13 DIAGNOSIS — M546 Pain in thoracic spine: Secondary | ICD-10-CM | POA: Diagnosis not present

## 2022-12-14 ENCOUNTER — Other Ambulatory Visit: Payer: Self-pay | Admitting: Gastroenterology

## 2022-12-20 ENCOUNTER — Telehealth: Payer: Self-pay | Admitting: Family Medicine

## 2022-12-20 NOTE — Telephone Encounter (Signed)
[facility] pharmacy requesting prescription refill omeprazole (PRILOSEC) 20 MG capsule  Please advise

## 2022-12-20 NOTE — Telephone Encounter (Signed)
Patient is a established patient at [facility].

## 2022-12-27 DIAGNOSIS — M546 Pain in thoracic spine: Secondary | ICD-10-CM | POA: Diagnosis not present

## 2022-12-29 ENCOUNTER — Other Ambulatory Visit: Payer: Self-pay

## 2022-12-30 DIAGNOSIS — M546 Pain in thoracic spine: Secondary | ICD-10-CM | POA: Diagnosis not present

## 2022-12-30 DIAGNOSIS — M47814 Spondylosis without myelopathy or radiculopathy, thoracic region: Secondary | ICD-10-CM | POA: Diagnosis not present

## 2023-01-02 ENCOUNTER — Other Ambulatory Visit: Payer: Self-pay | Admitting: Family Medicine

## 2023-01-02 DIAGNOSIS — E119 Type 2 diabetes mellitus without complications: Secondary | ICD-10-CM

## 2023-01-02 DIAGNOSIS — I10 Essential (primary) hypertension: Secondary | ICD-10-CM

## 2023-01-04 DIAGNOSIS — M47814 Spondylosis without myelopathy or radiculopathy, thoracic region: Secondary | ICD-10-CM | POA: Diagnosis not present

## 2023-01-04 DIAGNOSIS — R350 Frequency of micturition: Secondary | ICD-10-CM | POA: Diagnosis not present

## 2023-01-04 DIAGNOSIS — K529 Noninfective gastroenteritis and colitis, unspecified: Secondary | ICD-10-CM | POA: Diagnosis not present

## 2023-01-04 DIAGNOSIS — E119 Type 2 diabetes mellitus without complications: Secondary | ICD-10-CM | POA: Diagnosis not present

## 2023-01-04 DIAGNOSIS — Z794 Long term (current) use of insulin: Secondary | ICD-10-CM | POA: Diagnosis not present

## 2023-01-05 ENCOUNTER — Ambulatory Visit: Payer: Medicare Other | Admitting: Gastroenterology

## 2023-01-05 ENCOUNTER — Other Ambulatory Visit: Payer: Self-pay | Admitting: Family Medicine

## 2023-01-05 ENCOUNTER — Encounter: Payer: Self-pay | Admitting: Gastroenterology

## 2023-01-05 DIAGNOSIS — I10 Essential (primary) hypertension: Secondary | ICD-10-CM

## 2023-01-05 DIAGNOSIS — E119 Type 2 diabetes mellitus without complications: Secondary | ICD-10-CM

## 2023-01-11 ENCOUNTER — Telehealth: Payer: Self-pay

## 2023-01-11 NOTE — Telephone Encounter (Signed)
Patient left a message and states she is on mesalamine and on the prescription bottle it says that she should avoid long periods of sunlight. She states she is going to the beach the end of the month and wants to make sure it is okay she can sit out on the beach.

## 2023-01-11 NOTE — Telephone Encounter (Signed)
Patient verbalized understanding  

## 2023-01-20 DIAGNOSIS — M546 Pain in thoracic spine: Secondary | ICD-10-CM | POA: Diagnosis not present

## 2023-01-24 DIAGNOSIS — M9904 Segmental and somatic dysfunction of sacral region: Secondary | ICD-10-CM | POA: Diagnosis not present

## 2023-01-24 DIAGNOSIS — M9901 Segmental and somatic dysfunction of cervical region: Secondary | ICD-10-CM | POA: Diagnosis not present

## 2023-01-24 DIAGNOSIS — M9905 Segmental and somatic dysfunction of pelvic region: Secondary | ICD-10-CM | POA: Diagnosis not present

## 2023-01-24 DIAGNOSIS — M9903 Segmental and somatic dysfunction of lumbar region: Secondary | ICD-10-CM | POA: Diagnosis not present

## 2023-01-25 DIAGNOSIS — M9904 Segmental and somatic dysfunction of sacral region: Secondary | ICD-10-CM | POA: Diagnosis not present

## 2023-01-25 DIAGNOSIS — M9901 Segmental and somatic dysfunction of cervical region: Secondary | ICD-10-CM | POA: Diagnosis not present

## 2023-01-25 DIAGNOSIS — M9903 Segmental and somatic dysfunction of lumbar region: Secondary | ICD-10-CM | POA: Diagnosis not present

## 2023-01-25 DIAGNOSIS — M9905 Segmental and somatic dysfunction of pelvic region: Secondary | ICD-10-CM | POA: Diagnosis not present

## 2023-01-26 DIAGNOSIS — M9903 Segmental and somatic dysfunction of lumbar region: Secondary | ICD-10-CM | POA: Diagnosis not present

## 2023-01-26 DIAGNOSIS — M9901 Segmental and somatic dysfunction of cervical region: Secondary | ICD-10-CM | POA: Diagnosis not present

## 2023-01-26 DIAGNOSIS — M9905 Segmental and somatic dysfunction of pelvic region: Secondary | ICD-10-CM | POA: Diagnosis not present

## 2023-01-26 DIAGNOSIS — M9904 Segmental and somatic dysfunction of sacral region: Secondary | ICD-10-CM | POA: Diagnosis not present

## 2023-01-27 DIAGNOSIS — M9901 Segmental and somatic dysfunction of cervical region: Secondary | ICD-10-CM | POA: Diagnosis not present

## 2023-01-27 DIAGNOSIS — M9904 Segmental and somatic dysfunction of sacral region: Secondary | ICD-10-CM | POA: Diagnosis not present

## 2023-01-27 DIAGNOSIS — M9903 Segmental and somatic dysfunction of lumbar region: Secondary | ICD-10-CM | POA: Diagnosis not present

## 2023-01-27 DIAGNOSIS — M9905 Segmental and somatic dysfunction of pelvic region: Secondary | ICD-10-CM | POA: Diagnosis not present

## 2023-01-28 DIAGNOSIS — M9901 Segmental and somatic dysfunction of cervical region: Secondary | ICD-10-CM | POA: Diagnosis not present

## 2023-01-28 DIAGNOSIS — M9903 Segmental and somatic dysfunction of lumbar region: Secondary | ICD-10-CM | POA: Diagnosis not present

## 2023-01-28 DIAGNOSIS — M9904 Segmental and somatic dysfunction of sacral region: Secondary | ICD-10-CM | POA: Diagnosis not present

## 2023-01-28 DIAGNOSIS — M9905 Segmental and somatic dysfunction of pelvic region: Secondary | ICD-10-CM | POA: Diagnosis not present

## 2023-01-31 DIAGNOSIS — M9901 Segmental and somatic dysfunction of cervical region: Secondary | ICD-10-CM | POA: Diagnosis not present

## 2023-01-31 DIAGNOSIS — M9903 Segmental and somatic dysfunction of lumbar region: Secondary | ICD-10-CM | POA: Diagnosis not present

## 2023-01-31 DIAGNOSIS — M9904 Segmental and somatic dysfunction of sacral region: Secondary | ICD-10-CM | POA: Diagnosis not present

## 2023-01-31 DIAGNOSIS — M9905 Segmental and somatic dysfunction of pelvic region: Secondary | ICD-10-CM | POA: Diagnosis not present

## 2023-02-01 DIAGNOSIS — M9905 Segmental and somatic dysfunction of pelvic region: Secondary | ICD-10-CM | POA: Diagnosis not present

## 2023-02-01 DIAGNOSIS — M9904 Segmental and somatic dysfunction of sacral region: Secondary | ICD-10-CM | POA: Diagnosis not present

## 2023-02-01 DIAGNOSIS — M9901 Segmental and somatic dysfunction of cervical region: Secondary | ICD-10-CM | POA: Diagnosis not present

## 2023-02-01 DIAGNOSIS — M9903 Segmental and somatic dysfunction of lumbar region: Secondary | ICD-10-CM | POA: Diagnosis not present

## 2023-02-03 DIAGNOSIS — M9903 Segmental and somatic dysfunction of lumbar region: Secondary | ICD-10-CM | POA: Diagnosis not present

## 2023-02-03 DIAGNOSIS — M9905 Segmental and somatic dysfunction of pelvic region: Secondary | ICD-10-CM | POA: Diagnosis not present

## 2023-02-03 DIAGNOSIS — M9904 Segmental and somatic dysfunction of sacral region: Secondary | ICD-10-CM | POA: Diagnosis not present

## 2023-02-03 DIAGNOSIS — M9901 Segmental and somatic dysfunction of cervical region: Secondary | ICD-10-CM | POA: Diagnosis not present

## 2023-02-04 DIAGNOSIS — M9905 Segmental and somatic dysfunction of pelvic region: Secondary | ICD-10-CM | POA: Diagnosis not present

## 2023-02-04 DIAGNOSIS — M9903 Segmental and somatic dysfunction of lumbar region: Secondary | ICD-10-CM | POA: Diagnosis not present

## 2023-02-04 DIAGNOSIS — M9901 Segmental and somatic dysfunction of cervical region: Secondary | ICD-10-CM | POA: Diagnosis not present

## 2023-02-04 DIAGNOSIS — M9904 Segmental and somatic dysfunction of sacral region: Secondary | ICD-10-CM | POA: Diagnosis not present

## 2023-02-11 ENCOUNTER — Other Ambulatory Visit: Payer: Self-pay | Admitting: Family Medicine

## 2023-02-16 DIAGNOSIS — M47814 Spondylosis without myelopathy or radiculopathy, thoracic region: Secondary | ICD-10-CM | POA: Diagnosis not present

## 2023-02-16 DIAGNOSIS — M5416 Radiculopathy, lumbar region: Secondary | ICD-10-CM | POA: Diagnosis not present

## 2023-02-25 DIAGNOSIS — E119 Type 2 diabetes mellitus without complications: Secondary | ICD-10-CM | POA: Diagnosis not present

## 2023-02-25 DIAGNOSIS — Z0000 Encounter for general adult medical examination without abnormal findings: Secondary | ICD-10-CM | POA: Diagnosis not present

## 2023-02-25 DIAGNOSIS — R634 Abnormal weight loss: Secondary | ICD-10-CM | POA: Diagnosis not present

## 2023-02-25 DIAGNOSIS — G3184 Mild cognitive impairment, so stated: Secondary | ICD-10-CM | POA: Diagnosis not present

## 2023-02-28 DIAGNOSIS — M5416 Radiculopathy, lumbar region: Secondary | ICD-10-CM | POA: Diagnosis not present

## 2023-03-02 ENCOUNTER — Other Ambulatory Visit: Payer: Self-pay | Admitting: Family Medicine

## 2023-03-02 DIAGNOSIS — M5416 Radiculopathy, lumbar region: Secondary | ICD-10-CM | POA: Diagnosis not present

## 2023-03-02 NOTE — Telephone Encounter (Signed)
Requested medication (s) are due for refill today: yes  Requested medication (s) are on the active medication list: yes    Last refill: 1/3/024 #90  0 refills  Future visit scheduled No  Notes to clinic:Failed due to labs, please review. Thank you.  Requested Prescriptions  Pending Prescriptions Disp Refills   atorvastatin (LIPITOR) 40 MG tablet [Pharmacy Med Name: ATORVASTATIN 40 MG TABLET] 90 tablet 0    Sig: TAKE 1 TABLET BY MOUTH EVERY DAY     Cardiovascular:  Antilipid - Statins Failed - 03/02/2023  1:17 PM      Failed - Lipid Panel in normal range within the last 12 months    Cholesterol, Total  Date Value Ref Range Status  12/02/2020 77 (L) 100 - 199 mg/dL Final   LDL Chol Calc (NIH)  Date Value Ref Range Status  12/02/2020 16 0 - 99 mg/dL Final   HDL  Date Value Ref Range Status  12/02/2020 47 >39 mg/dL Final   Triglycerides  Date Value Ref Range Status  12/02/2020 58 0 - 149 mg/dL Final         Passed - Patient is not pregnant      Passed - Valid encounter within last 12 months    Recent Outpatient Visits           3 months ago Diabetes mellitus type 2 without retinopathy (HCC)   [facility] Simmons-Robinson, [place], MD   10 months ago Type 2 diabetes mellitus with diabetic nephropathy, with long-term current use of insulin (HCC)   [facility] [facility] Bosie Clos, MD   1 year ago Cerebrovascular accident (CVA), unspecified mechanism (HCC)   [place] [facility] Bosie Clos, MD   1 year ago Diabetes mellitus type 2 without retinopathy (HCC)   [place] [facility] Mecum, Erin E, PA-C   1 year ago Diabetes mellitus type 2 without retinopathy (HCC)   [facility] Bosie Clos, MD       Future Appointments             In 3 weeks Vanga, Loel Dubonnet, MD [facility]

## 2023-03-10 DIAGNOSIS — R300 Dysuria: Secondary | ICD-10-CM | POA: Diagnosis not present

## 2023-03-10 DIAGNOSIS — N3001 Acute cystitis with hematuria: Secondary | ICD-10-CM | POA: Diagnosis not present

## 2023-03-11 DIAGNOSIS — M5416 Radiculopathy, lumbar region: Secondary | ICD-10-CM | POA: Diagnosis not present

## 2023-03-18 DIAGNOSIS — M5416 Radiculopathy, lumbar region: Secondary | ICD-10-CM | POA: Diagnosis not present

## 2023-03-28 ENCOUNTER — Ambulatory Visit: Payer: Medicare Other | Admitting: Gastroenterology

## 2023-03-28 ENCOUNTER — Other Ambulatory Visit: Payer: Self-pay

## 2023-04-04 ENCOUNTER — Other Ambulatory Visit: Payer: Self-pay | Admitting: Family Medicine

## 2023-04-06 ENCOUNTER — Telehealth: Payer: Self-pay

## 2023-04-06 NOTE — Telephone Encounter (Signed)
Patient contacted office wanting to know from Dr. Valentino Saxon what are risk factors if she leaves her pessary in longer than 3 months? Patient was last seen for pessary maintenance on 11/12/22 and was advised to return in 3 months, patient has a scheduled appointment with you on 04/21/23. Patient states that she has a herniated disc in her back and has difficulty walking, patient states that she wants to know since appointment is far out if there are risk of infection the longer she keeps it in? [employer]

## 2023-04-11 NOTE — Telephone Encounter (Signed)
Pt aware. She denies vaginal odor. Gave her appt time and date of 7/11 at 2:55 pm. She was thinking her appt was 7/9.

## 2023-04-19 ENCOUNTER — Ambulatory Visit: Payer: Self-pay

## 2023-04-19 NOTE — Patient Outreach (Signed)
  Care Coordination   Initial Visit Note   04/19/2023 Name: Anna Williams MRN: [id number] DOB: 04-23-1946  Anna Williams is a 77 y.o. year old female who sees Anna Clos, MD for primary care. I spoke with  Anna Williams by phone today.  What matters to the patients health and wellness today?  Patient request staff call at another time and she is not able to talk.      Goals Addressed   None     SDOH assessments and interventions completed:  No     Care Coordination Interventions:  No, not indicated   Follow up plan:  Will call another day.    Encounter Outcome:  Pt. Request to Call Back

## 2023-04-21 ENCOUNTER — Encounter: Payer: Self-pay | Admitting: Obstetrics and Gynecology

## 2023-04-21 ENCOUNTER — Ambulatory Visit: Payer: Medicare Other | Admitting: Obstetrics and Gynecology

## 2023-04-21 VITALS — BP 104/60 | HR 89 | Resp 16 | Ht 67.0 in | Wt 137.4 lb

## 2023-04-21 DIAGNOSIS — N8110 Cystocele, unspecified: Secondary | ICD-10-CM

## 2023-04-21 DIAGNOSIS — N393 Stress incontinence (female) (male): Secondary | ICD-10-CM

## 2023-04-21 DIAGNOSIS — Z4689 Encounter for fitting and adjustment of other specified devices: Secondary | ICD-10-CM

## 2023-04-21 DIAGNOSIS — R1900 Intra-abdominal and pelvic swelling, mass and lump, unspecified site: Secondary | ICD-10-CM | POA: Diagnosis not present

## 2023-04-21 DIAGNOSIS — N952 Postmenopausal atrophic vaginitis: Secondary | ICD-10-CM

## 2023-04-21 DIAGNOSIS — N814 Uterovaginal prolapse, unspecified: Secondary | ICD-10-CM

## 2023-04-21 DIAGNOSIS — N83202 Unspecified ovarian cyst, left side: Secondary | ICD-10-CM

## 2023-04-21 NOTE — Progress Notes (Signed)
    GYNECOLOGY PROGRESS NOTE  Subjective:    Patient ID: Anna Williams, female    DOB: Nov 24, 1945, 77 y.o.   MRN: [id number]  HPI  Patient is a 77 y.o. G1P2002 female who presents for Pessary Maintenance.Has been 5 months since last pessary check. She has a h/o urinary incontinence x 1.5 years and small cystocele with rectocele. Currently has a size 2 ring with support and knob pessary in place. She reports no vaginal bleeding or discharge. She denies pelvic discomfort and difficulty urinating or moving her bowels.   Patient also notes issues with back pain. Currently has a herniated disc. Expresses concerns about ancillary findings of pelvic cyst (renal vs ovarian in nature) noted on MRI.   The following portions of the patient's history were reviewed and updated as appropriate: allergies, current medications, past family history, past medical history, past social history, past surgical history, and problem list.  Review of Systems Pertinent items are noted in HPI.   Objective:   Blood pressure 104/60, pulse 89, resp. rate 16, height 5\' 7"  (1.702 m), weight 137 lb 6.4 oz (62.3 kg).  Body mass index is 21.52 kg/m. General appearance: alert, cooperative, and no distress Abdomen: soft, non-tender; bowel sounds normal; no masses,  no organomegaly Pelvic:  Currently has a size 2 ring with support and knob pessary in place. She reports no vaginal bleeding or discharge. She denies pelvic discomfort and difficulty urinating or moving her bowels.     Imaging ([facility], performed 528/2024): MRI LUMBAR SPINE W/O CONTRAST   Findings: Tiny renal cysts Greater than 3 cm long cyst in the left hemipelvis will require further investigation to exclude an ovarian neoplasm or related entity pre- and post contrast to ascertain whether any vascularity is present.  He cord and conus are normal distally.  T11-T12 and T12-L1: Are normal L1-L2: Normal L2-L3: Disc bulge with mild canal stenosis.  L3-L4:  Central protrusion with mild canal stenosis and facet hypertrophy and superior and to the right effacing right descending L5 and abutting exiting L4.  Exacerbating facet arhtropathy. Superiorightward migration.  Slight inferorirghtward extension into the subarticular recess as described.  L5-S1: Normal with retrospinal sarcopenia bilaterally.    Assessment:   1. Cystocele with rectocele   2. Stress incontinence in female   3. Vaginal atrophy   4. Pessary maintenance   5. Left ovarian cyst   6. Pelvic mass in female      Plan:   Pessary cleaned, reinserted today without difficulty.  Discussed maintenance. The patient should return in 3 months for a pessary check and continue Trimo-San gel weekly as prescribed. Pelvic mass noted on MRI, discussed that most likely is benign, and size is not worrisome (3 cm), however further investigation warranted to assess if adnexal or renal. Pelvic ultrasound ordered.     A total of 20 minutes were spent during this encounter, including review of previous progress notes, recent imaging and labs, face-to-face with time with patient involving counseling and coordination of care, as well as documentation for current visit.   Hildred Laser, MD [facility]

## 2023-04-28 ENCOUNTER — Ambulatory Visit
Admission: RE | Admit: 2023-04-28 | Discharge: 2023-04-28 | Disposition: A | Discharge status: Home / Self Care | Payer: Medicare Other | Source: Ambulatory Visit | Attending: Obstetrics and Gynecology | Admitting: Obstetrics and Gynecology

## 2023-04-28 ENCOUNTER — Other Ambulatory Visit: Payer: Self-pay | Admitting: Obstetrics and Gynecology

## 2023-04-28 DIAGNOSIS — N393 Stress incontinence (female) (male): Secondary | ICD-10-CM

## 2023-04-28 DIAGNOSIS — N83202 Unspecified ovarian cyst, left side: Secondary | ICD-10-CM | POA: Insufficient documentation

## 2023-04-28 DIAGNOSIS — N952 Postmenopausal atrophic vaginitis: Secondary | ICD-10-CM

## 2023-04-28 DIAGNOSIS — Z4689 Encounter for fitting and adjustment of other specified devices: Secondary | ICD-10-CM

## 2023-04-28 DIAGNOSIS — R1900 Intra-abdominal and pelvic swelling, mass and lump, unspecified site: Secondary | ICD-10-CM

## 2023-04-28 DIAGNOSIS — N8110 Cystocele, unspecified: Secondary | ICD-10-CM

## 2023-05-05 ENCOUNTER — Telehealth: Payer: Self-pay

## 2023-05-05 ENCOUNTER — Encounter: Payer: Self-pay | Admitting: Obstetrics and Gynecology

## 2023-05-05 DIAGNOSIS — N83299 Other ovarian cyst, unspecified side: Secondary | ICD-10-CM

## 2023-05-05 NOTE — Telephone Encounter (Signed)
This encounter was created in error - please disregard.

## 2023-05-05 NOTE — Telephone Encounter (Signed)
Patient called in reference to Ultrasound last week.  She would like to know the results.  Will you please give her a call today if you can?

## 2023-05-05 NOTE — Telephone Encounter (Signed)
Pt calling for results of her u/s; is very anxious.  [phone]  Results not in chart.  Called for results - Selena Batten to send msg to Radiologist.  Pt aware.

## 2023-05-06 NOTE — Telephone Encounter (Signed)
The patient is calling saying she has reviewed your message an would like to proceed with blood work you recommended. She is wanting to know if we are able to do this in office. Please advise? The patient is aware you are out of the office today and Monday. Please let me know if there is anything I can help with.

## 2023-05-08 NOTE — Telephone Encounter (Signed)
Please inform that she can come any day to the office and have it drawn. The results usually return in 3-4 days.

## 2023-05-09 ENCOUNTER — Other Ambulatory Visit: Payer: Medicare Other

## 2023-05-09 NOTE — Telephone Encounter (Signed)
I contacted the patient via phone. I left voicemail asking the patient to call back to schedule for lab only per Dr. Valentino Saxon. Per Dr. Valentino Saxon results usually return in 3-4 days.

## 2023-05-09 NOTE — Telephone Encounter (Signed)
The patient has contacted the office. So she scheduled for today at 3 pm for labs. The patient has been advise per Dr. Oretha Milch note the result results take 3-4 days to come in.

## 2023-05-10 ENCOUNTER — Telehealth: Payer: Self-pay | Admitting: Obstetrics and Gynecology

## 2023-05-10 DIAGNOSIS — N83299 Other ovarian cyst, unspecified side: Secondary | ICD-10-CM

## 2023-05-10 NOTE — Telephone Encounter (Signed)
Contacted patient by phone regarding ROMA test results.  Elevations noted of CA-125 and postmenopausal ROMA, advised that this may be more concerning for an atypical ovarian cyst or even malignancy.  Will refer to GYN Oncology for further evaluation and recommendations. Patient and husband note understanding. Attempted to answer all questions.

## 2023-05-25 ENCOUNTER — Inpatient Hospital Stay: Payer: Medicare Other | Admitting: Obstetrics and Gynecology

## 2023-05-25 ENCOUNTER — Ambulatory Visit: Payer: Medicare Other

## 2023-05-25 ENCOUNTER — Encounter: Payer: Self-pay | Admitting: Obstetrics and Gynecology

## 2023-05-25 VITALS — BP 149/62 | HR 90 | Temp 96.9°F | Resp 19 | Wt 136.3 lb

## 2023-05-25 DIAGNOSIS — Z86711 Personal history of pulmonary embolism: Secondary | ICD-10-CM | POA: Diagnosis not present

## 2023-05-25 DIAGNOSIS — I69320 Aphasia following cerebral infarction: Secondary | ICD-10-CM | POA: Insufficient documentation

## 2023-05-25 DIAGNOSIS — Z79899 Other long term (current) drug therapy: Secondary | ICD-10-CM | POA: Diagnosis not present

## 2023-05-25 DIAGNOSIS — Z960 Presence of urogenital implants: Secondary | ICD-10-CM | POA: Insufficient documentation

## 2023-05-25 DIAGNOSIS — E119 Type 2 diabetes mellitus without complications: Secondary | ICD-10-CM | POA: Diagnosis not present

## 2023-05-25 DIAGNOSIS — I10 Essential (primary) hypertension: Secondary | ICD-10-CM | POA: Insufficient documentation

## 2023-05-25 DIAGNOSIS — Z8673 Personal history of transient ischemic attack (TIA), and cerebral infarction without residual deficits: Secondary | ICD-10-CM | POA: Insufficient documentation

## 2023-05-25 DIAGNOSIS — F32A Depression, unspecified: Secondary | ICD-10-CM | POA: Insufficient documentation

## 2023-05-25 DIAGNOSIS — R971 Elevated cancer antigen 125 [CA 125]: Secondary | ICD-10-CM | POA: Insufficient documentation

## 2023-05-25 DIAGNOSIS — D3912 Neoplasm of uncertain behavior of left ovary: Secondary | ICD-10-CM | POA: Insufficient documentation

## 2023-05-25 DIAGNOSIS — Z791 Long term (current) use of non-steroidal anti-inflammatories (NSAID): Secondary | ICD-10-CM | POA: Insufficient documentation

## 2023-05-25 DIAGNOSIS — Z7984 Long term (current) use of oral hypoglycemic drugs: Secondary | ICD-10-CM | POA: Diagnosis not present

## 2023-05-25 DIAGNOSIS — M5126 Other intervertebral disc displacement, lumbar region: Secondary | ICD-10-CM | POA: Diagnosis not present

## 2023-05-25 DIAGNOSIS — N838 Other noninflammatory disorders of ovary, fallopian tube and broad ligament: Secondary | ICD-10-CM

## 2023-05-25 DIAGNOSIS — R739 Hyperglycemia, unspecified: Secondary | ICD-10-CM | POA: Insufficient documentation

## 2023-05-25 DIAGNOSIS — F419 Anxiety disorder, unspecified: Secondary | ICD-10-CM | POA: Insufficient documentation

## 2023-05-25 DIAGNOSIS — Z794 Long term (current) use of insulin: Secondary | ICD-10-CM | POA: Diagnosis not present

## 2023-05-25 DIAGNOSIS — T380X5S Adverse effect of glucocorticoids and synthetic analogues, sequela: Secondary | ICD-10-CM | POA: Insufficient documentation

## 2023-05-25 NOTE — Progress Notes (Signed)
Gynecologic Oncology Consult Visit   Referring Provider: Dr Valentino Saxon  Chief Concern: 4.7 cm left ovarian cystic mass, incidentally detected on MRI for back pain. Elevated CA125.  Subjective:  Anna Williams is a 77 y.o. P2 female who is seen in consultation from Dr. Valentino Saxon for incidentally discovered left ovarian mass on MRI for low back pain.  She follows with Dr Valentino Saxon for Pessary Maintenance. She has a h/o urinary incontinence x 1.5 years and small cystocele with rectocele. Currently has a size 2 ring with support and knob pessary in place. She reports no vaginal bleeding or discharge. She denies pelvic discomfort and difficulty urinating or moving her bowels.    Saw Dr Valentino Saxon 04/21/23 for pessary change.  Patient also notes issues with back pain. Currently has a herniated disc on lumbar MRI. Expresses concerns about ancillary findings of pelvic cyst (renal vs ovarian in nature) noted on MRI.     Imaging ([facility], performed 03/08/2023): MRI LUMBAR SPINE W/O CONTRAST   Findings: Tiny renal cysts Greater than 3 cm long cyst in the left hemipelvis will require further investigation to exclude an ovarian neoplasm or related entity pre- and post contrast to ascertain whether any vascularity is present.  He cord and conus are normal distally.  T11-T12 and T12-L1: Are normal L1-L2: Normal L2-L3: Disc bulge with mild canal stenosis.  L3-L4: Central protrusion with mild canal stenosis and facet hypertrophy and superior and to the right effacing right descending L5 and abutting exiting L4.  Exacerbating facet arhtropathy. Superiorightward migration.  Slight inferorirghtward extension into the subarticular recess as described.  L5-S1: Normal with retrospinal sarcopenia bilaterally.       Pelvic US 04/28/23 EXAM: TRANSABDOMINAL ULTRASOUND OF PELVIS   TECHNIQUE: Transabdominal ultrasound examination of the pelvis was performed including evaluation of the uterus, ovaries, adnexal regions,  and pelvic cul-de-sac.   COMPARISON:  None Available.   FINDINGS: Uterus   Not visualized.   Endometrium   Not visualized.   Right ovary   Not visualized.   Left ovary   Left ovary measures 4.7 x 4.4 x 3.7 cm (40 mL volume). There is a complex cyst with septations in the left ovary measuring 4.9 x 3.9 x 4.5 cm. Additionally there is a heterogeneous hypoechoic area seen exophytic from or adjacent to the left ovary measuring 2.8 x 2.3 x 2.2 cm. There is vascular flow identified within the septations of the cystic component, but not within the echogenic component.   Other findings:  No abnormal free fluid.   IMPRESSION: 1. Complex cyst with septations in the left ovary measuring up to 4.9 cm. Additionally there is a heterogeneous hypoechoic area seen exophytic from or adjacent to the left ovary measuring up to 2.8 cm. There is vascular flow identified within the septations of the cystic component, but not within the echogenic component. Findings may be related to ovarian dermoid, but other neoplasms are not excluded. Recommend further evaluation with MRI of the pelvis with and without contrast. 2. Uterus and right ovary are not visualized.   05/09/23    Component Ref Range & Units 2 wk ago  Cancer Antigen (CA) 125 0.0 - 38.1 U/mL 80.1 High   Comment: [employer] Electrochemiluminescence Immunoassay (ECLIA) Values obtained with different assay methods or kits cannot be used interchangeably.  Results cannot be interpreted as absolute evidence of the presence or absence of malignant disease.  HE4 0.0 - 96.9 pmol/L 75.4  Comment: [employer] Electrochemiluminescence Immunoassay (ECLIA) Values obtained with different assay  methods or kits cannot be used interchangeably.  Results cannot be interpreted as absolute evidence of the presence or absence of malignant disease.  Premenopausal ROMA See below 1.92 High   Postmenopausal ROMA See below 4.05 High    Comment Comment  Comment: The Risk for Ovarian Malignancy Algorithm (ROMA) test is intended to aid in assessing whether a premenopausal or postmenopausal woman who presents with an ovarian adnexal mass is at high or low likelihood of finding malignancy on surgery. ROMA is indicated for women who meet the following criteria: over age 3; ovarian adnexal mass present for which surgery is planned, and not yet referred to an oncologist. ROMA must be interpreted in conjunction with an independent clinical and radiological assessment. The test is not intended as a screening or stand-alone diagnostic assay. HE4 and CA125 values obtained with different assay methods or kits cannot be used interchangeably.    Comment: If the patient is postmenopausal, then the postmenopausal ROMA score of greater than or equal to 2.99 is consistent with a high likelihood of finding a malignancy on surgery.   Her main complaint presently is severe back pain and this shoots down the back of her right leg requiring pain medication and use of a walker. She has herniated lumbar disc that has been treated medically for the past few months with corticosteroids.  This caused her HgbA1C to be elevated, so she is now working to achieve better blood sugar control.  There are plans for discectomy surgery when this is improved.  No pelvic pain or vaginal bleeding.    Problem List: Patient Active Problem List   Diagnosis Date Noted   Ovarian mass 05/25/2023   Chronic diarrhea 11/16/2022   Gastric erythema 11/16/2022   Need for influenza vaccination 11/08/2022   MCI (mild cognitive impairment) 12/22/2021   Cervical radiculitis 04/25/2020   Confusion state 03/24/2020   Expressive aphasia 03/24/2020   History of recent stroke 03/24/2020   CVA (cerebral vascular accident) (HCC) 03/10/2020   Vaginal atrophy 04/29/2016   Fibroid, uterine 04/29/2016   Pelvic pain in female 04/29/2016   Hypertension 10/16/2015   Diabetes  mellitus type 2 without retinopathy (HCC) 05/27/2015   Menopause 04/29/2015   Fibroids 04/29/2015   Anxiety 04/29/2015   Cervical dysplasia 04/29/2015   History of pulmonary embolus (PE) 04/29/2015   Family history of breast cancer in mother 04/29/2015   Retinal detachment 04/29/2015    Past Medical History: Past Medical History:  Diagnosis Date   Anxiety and depression    ASCUS with positive high risk HPV 09/2013   Cataract    Diabetes mellitus without complication (HCC)    type 2   Elevated blood sugar    Family history of breast cancer in mother    Fibroid uterus    History of pulmonary embolus (PE)    Hypertension    Menopause    Mild dysplasia of cervix 09/2013   repeat pap done -ecc neg, cerival bx cin 1   Retinal detachment    Stroke [facility])     Past Surgical History: Past Surgical History:  Procedure Laterality Date   anterior neck fusion     BREAST CYST ASPIRATION Left yrs ago   CHOLECYSTECTOMY  10/11/2008   COLONOSCOPY WITH PROPOFOL N/A 11/16/2022   Procedure: COLONOSCOPY WITH PROPOFOL;  Surgeon: Toney Reil, MD;  Location: [facility] ENDOSCOPY;  Service: Gastroenterology;  Laterality: N/A;   DILATION AND CURETTAGE OF UTERUS     ESOPHAGOGASTRODUODENOSCOPY (EGD) WITH PROPOFOL N/A 11/16/2022  Procedure: ESOPHAGOGASTRODUODENOSCOPY (EGD) WITH PROPOFOL;  Surgeon: Toney Reil, MD;  Location: [facility] ENDOSCOPY;  Service: Gastroenterology;  Laterality: N/A;   EYE SURGERY     HYSTEROSCOPY     mole removed  10/11/2012   RETINAL DETACHMENT SURGERY        OB History:  OB History  Gravida Para Term Preterm AB Living  2 2 2     2   SAB IAB Ectopic Multiple Live Births          2    # Outcome Date GA Lbr Len/2nd Weight Sex Type Anes PTL Lv  2 Term 1978    M Vag-Spont   LIV  1 Term 1964    M Vag-Spont   LIV    Family History: Family History  Problem Relation Age of Onset   Breast cancer Mother 78       reccurence age 38   Heart disease Father    Throat  cancer Brother    Heart disease Brother    Lung cancer Brother        with mets to brain   Breast cancer Maternal Aunt    Colon cancer Neg Hx    Ovarian cancer Neg Hx     Social History: Social History   Socioeconomic History   Marital status: Married    Spouse name: Not on file   Number of children: 2   Years of education: Not on file   Highest education level: Associate degree: occupational, Scientist, product/process development, or vocational program  Occupational History   Occupation: retired  Tobacco Use   Smoking status: Never   Smokeless tobacco: Never  Vaping Use   Vaping status: Never Used  Substance and Sexual Activity   Alcohol use: No   Drug use: No   Sexual activity: Not Currently    Birth control/protection: Post-menopausal  Other Topics Concern   Not on file  Social History Narrative   Not on file   Social Determinants of Health   Financial Resource Strain: Low Risk  (06/22/2022)   Overall Financial Resource Strain (CARDIA)    Difficulty of Paying Living Expenses: Not hard at all  Food Insecurity: No Food Insecurity (06/22/2022)   Hunger Vital Sign    Worried About Running Out of Food in the Last Year: Never true    Ran Out of Food in the Last Year: Never true  Transportation Needs: No Transportation Needs (06/22/2022)   PRAPARE - Administrator, Civil Service (Medical): No    Lack of Transportation (Non-Medical): No  Physical Activity: Sufficiently Active (06/22/2022)   Exercise Vital Sign    Days of Exercise per Week: 3 days    Minutes of Exercise per Session: 60 min  Stress: No Stress Concern Present (06/22/2022)   [employer] of Occupational Health - Occupational Stress Questionnaire    Feeling of Stress : Not at all  Social Connections: Moderately Integrated (06/22/2022)   Social Connection and Isolation Panel [NHANES]    Frequency of Communication with Friends and Family: More than three times a week    Frequency of Social Gatherings with Friends and  Family: Once a week    Attends Religious Services: More than 4 times per year    Active Member of [employer] or Organizations: No    Attends Banker Meetings: Never    Marital Status: Married  Catering manager Violence: Not At Risk (06/22/2022)   Humiliation, Afraid, Rape, and Kick questionnaire    Fear of Current  or Ex-Partner: No    Emotionally Abused: No    Physically Abused: No    Sexually Abused: No    Allergies: Allergies  Allergen Reactions   Codeine Other (See Comments)    Pt went to sleep and had trouble waking up. Very sensitive to medication.   Lmx 4 [Lidocaine]     Current Medications: Current Outpatient Medications  Medication Sig Dispense Refill   amLODipine (NORVASC) 5 MG tablet Take 1 tablet (5 mg total) by mouth daily. Please schedule office visit before any future refill. 90 tablet 0   aspirin EC 81 MG tablet Take 81 mg by mouth daily.     atorvastatin (LIPITOR) 40 MG tablet TAKE 1 TABLET BY MOUTH EVERY DAY 90 tablet 0   Azelastine HCl 137 MCG/SPRAY SOLN Place 1 spray into both nostrils 2 (two) times daily.     BD PEN NEEDLE NANO 2ND GEN 32G X 4 MM MISC USE AS DIRECTED 100 each 11   Calcium Citrate-Vitamin D (CALCIUM + D PO) Take 1 tablet by mouth daily.     diazepam (VALIUM) 10 MG tablet Take 10 mg by mouth.     gabapentin (NEURONTIN) 100 MG capsule Take 100 mg by mouth 2 (two) times daily.     glucose blood test strip OneTouch Ultra Test strips     HYDROcodone-acetaminophen (NORCO/VICODIN) 5-325 MG tablet Take 1 tablet by mouth every 6 (six) hours.     insulin glargine, 1 Unit Dial, (TOUJEO SOLOSTAR) 300 UNIT/ML Solostar Pen Inject 5 Units into the skin daily. 1.5 mL 2   meloxicam (MOBIC) 7.5 MG tablet Take 7.5 mg by mouth daily.     memantine (NAMENDA) 5 MG tablet TAKE 1 TABLET BY MOUTH TWICE A DAY 180 tablet 1   mesalamine (LIALDA) 1.2 g EC tablet Take 2 tablets (2.4 g total) by mouth 2 (two) times daily. 120 tablet 1   methocarbamol (ROBAXIN) 500 MG  tablet Take by mouth.     mirtazapine (REMERON) 15 MG tablet Take 1 tablet by mouth at bedtime.     Multiple Vitamins-Minerals (PRESERVISION/LUTEIN PO) Take by mouth daily.     omeprazole (PRILOSEC) 20 MG capsule TAKE 1 CAPSULE BY MOUTH EVERY DAY 90 capsule 1   ONETOUCH DELICA LANCETS FINE MISC Check sugar once daily 50 each 12   ONETOUCH ULTRA test strip CHECK SUGAR ONCE DAILY 100 strip 11   pioglitazone (ACTOS) 45 MG tablet Take 1 tablet (45 mg total) by mouth daily. Please schedule office visit before any future refill. 90 tablet 0   traMADol (ULTRAM) 50 MG tablet Take 50 mg by mouth every 6 (six) hours as needed.     TRIMO-SAN 0.025-0.01 % GEL PLACE 1 APPLICATORFUL VAGINALLY ONCE A WEEK. AT BEDTIME     No current facility-administered medications for this visit.    Review of Systems General: negative for, fevers, chills, fatigue, changes in sleep, changes in weight or appetite Skin: negative for changes in color, texture, moles or lesions Eyes: negative for, changes in vision, pain, diplopia HEENT: negative for, change in hearing, pain, discharge, tinnitus, vertigo, voice changes, sore throat, neck masses Pulmonary: negative for, dyspnea, orthopnea, productive cough Cardiac: negative for, palpitations, syncope, pain, discomfort, pressure Gastrointestinal: negative for, dysphagia, nausea, vomiting, jaundice, pain, constipation, diarrhea, hematemesis, hematochezia Musculoskeletal: negative for, pain, stiffness, swelling, range of motion limitation Hematology: negative for, easy bruising, bleeding Neurologic/Psych: negative for, headaches, seizures, paralysis, tremor, change in sensation, mood swings, depression, anxiety  Objective:  Physical Examination:  There were no vitals taken for this visit.   ECOG Performance Status: 2 - Symptomatic, <50% confined to bed  General appearance: alert, cooperative, and appears stated age HEENT:PERRLA, neck supple with midline trachea, and  thyroid without masses Lymph node survey: non-palpable, axillary, inguinal, supraclavicular Cardiovascular: regular rate and rhythm, no murmurs or gallops Respiratory: normal air entry, lungs clear to auscultation and no rales, rhonchi or wheezing Abdomen: soft, non-tender, without masses or organomegaly, soft, nontender, without guarding, no hernias, and well healed incision Back: inspection of back is normal Extremities: extremities normal, atraumatic, no cyanosis or edema Skin exam - normal coloration and turgor, no rashes, no suspicious skin lesions noted. Neurological exam reveals alert, oriented, normal speech, no focal findings or movement disorder noted.  Pelvic: exam chaperoned by nurse;  Vulva: normal appearing vulva with no masses, tenderness or lesions; Vagina: pessary in place; Adnexa: no masses palpable; Uterus: not palpable with pessary in place; Rectal: not indicated        Assessment:  Anna Williams is a 77 y.o. female diagnosed with low back pain and has incidentally detected 4.7 cm multicystic left ovarian mass.  Elevated ROMA due to CA125 of 80.  Follows with Dr Valentino Saxon for SUI/pessary management.    She has herniated lumbar disc that has been treated medically for the past few months with corticosteroids.  This caused her HgbA1C to be elevated, so she is now working to achieve better blood sugar control.  There are plans for discectomy surgery when this is improved.    History of mild dysplasia, fibroids.  Medical co-morbidities complicating care: Diabetes, HTN, expressive aphasia post CVA 2021.  Plan:   Problem List Items Addressed This Visit       Other   Ovarian mass - Primary   We discussed options including expectant management versus laparoscopic surgery to remove the ovarian mass with BSO and possible hyst/staging.  Her main complaint presently is severe back pain and this shoots down the back of her right leg.  Her priority is to have surgery to address  the lumbar disc herniation.  She needs to optimize blood sugar control prior to this surgery.  In the meantime, we will order CT scan abdomen and pelvis to rule out concern for metastatic cancer, obtain ORADS score on pelvic US and repeat CA125 in one month.  Hopefully, this will provide reassurance of a low risk of a life threatening ovarian cancer based on this being an incidental finding, the small size of the multicystic mass and lack of solid features with minimally elevated CA125.  Most likely diagnosis is benign or borderline epithelial ovarian tumor.    Suggested return to clinic based on findings of above studies.  If reassuring, we will have her back surgery first and then come back to see [name] after that to plan for removal of the left adnexal mass.   The patient's diagnosis, an outline of the further diagnostic and laboratory studies which will be required, the recommendation, and alternatives were discussed.  All questions were answered to the patient's satisfaction.  A total of 60 minutes were spent with the patient/family today; 50% was spent in education, counseling and coordination of care for left ovarian mass.    Leida Lauth, MD   CC:  Hildred Laser, MD [address] [place],  [place] [address] [phone]

## 2023-06-01 ENCOUNTER — Ambulatory Visit
Admission: RE | Admit: 2023-06-01 | Discharge: 2023-06-01 | Disposition: A | Discharge status: Home / Self Care | Payer: Medicare Other | Source: Ambulatory Visit | Attending: Obstetrics and Gynecology | Admitting: Obstetrics and Gynecology

## 2023-06-01 DIAGNOSIS — N838 Other noninflammatory disorders of ovary, fallopian tube and broad ligament: Secondary | ICD-10-CM | POA: Diagnosis present

## 2023-06-01 MED ORDER — IOHEXOL 300 MG/ML  SOLN
100.0000 mL | Freq: Once | INTRAMUSCULAR | Status: AC | PRN
  Administered 2023-06-01: 100 mL via INTRAVENOUS

## 2023-06-09 ENCOUNTER — Other Ambulatory Visit: Payer: Self-pay

## 2023-06-09 DIAGNOSIS — N838 Other noninflammatory disorders of ovary, fallopian tube and broad ligament: Secondary | ICD-10-CM

## 2023-06-09 NOTE — Progress Notes (Signed)
Spoke with Dr. Johnnette Litter. [name] was sent to [facility] for radiology review and due to poor quality, as it was transabdominal only, it was recommended that she have a MRI pelvis. I have provided results of CT and recommendations for MRI to Anna Williams. Order for MRI placed and MRI scheduled for 06/17/2023, However she does not want to remove her blood sugar monitor, which would be required, and prefers her scan after 06/26/2023. MRI was rescheduled to 06/27/2023 at 1000 with [facility]

## 2023-06-17 ENCOUNTER — Other Ambulatory Visit: Payer: Medicare Other

## 2023-06-24 ENCOUNTER — Inpatient Hospital Stay: Payer: Medicare Other | Attending: Obstetrics and Gynecology

## 2023-06-24 DIAGNOSIS — D3912 Neoplasm of uncertain behavior of left ovary: Secondary | ICD-10-CM | POA: Diagnosis present

## 2023-06-24 DIAGNOSIS — N838 Other noninflammatory disorders of ovary, fallopian tube and broad ligament: Secondary | ICD-10-CM

## 2023-06-26 LAB — CA 125: Cancer Antigen (CA) 125: 61.7 U/mL — ABNORMAL HIGH (ref 0.0–38.1)

## 2023-06-27 ENCOUNTER — Ambulatory Visit
Admission: RE | Admit: 2023-06-27 | Discharge: 2023-06-27 | Disposition: A | Discharge status: Home / Self Care | Payer: Medicare Other | Source: Ambulatory Visit | Attending: Obstetrics and Gynecology | Admitting: Obstetrics and Gynecology

## 2023-06-27 ENCOUNTER — Ambulatory Visit: Payer: Medicare Other

## 2023-06-27 DIAGNOSIS — N838 Other noninflammatory disorders of ovary, fallopian tube and broad ligament: Secondary | ICD-10-CM | POA: Insufficient documentation

## 2023-06-27 MED ORDER — GADOBUTROL 1 MMOL/ML IV SOLN
6.0000 mL | Freq: Once | INTRAVENOUS | Status: AC | PRN
  Administered 2023-06-27: 6 mL via INTRAVENOUS

## 2023-06-28 ENCOUNTER — Other Ambulatory Visit: Payer: Self-pay | Admitting: Family Medicine

## 2023-06-28 DIAGNOSIS — Z1231 Encounter for screening mammogram for malignant neoplasm of breast: Secondary | ICD-10-CM

## 2023-07-11 ENCOUNTER — Telehealth: Payer: Self-pay

## 2023-07-11 NOTE — Telephone Encounter (Signed)
Received call from Ms. Thresher asking about results of MRI. They have been sent to Dr. Johnnette Litter for review as well as recent CA125. She had her back surgery last Tuesday, 07/05/23 and is recovering well except for pain at the incision site.

## 2023-07-12 ENCOUNTER — Other Ambulatory Visit: Payer: Self-pay

## 2023-07-12 ENCOUNTER — Telehealth: Payer: Self-pay

## 2023-07-12 DIAGNOSIS — N838 Other noninflammatory disorders of ovary, fallopian tube and broad ligament: Secondary | ICD-10-CM

## 2023-07-12 NOTE — Telephone Encounter (Signed)
Spoke with Ms. Forgy and provided recommendation of surveillance with 6 month follow up with CA 125. Appointment arranged. All questions answered.

## 2023-07-14 ENCOUNTER — Telehealth: Payer: Self-pay

## 2023-07-14 NOTE — Telephone Encounter (Signed)
Contacted Ms. Wiedel. Per Dr. Johnnette Litter since she has had her back surgery, he will see her back in 3 months with repeat CA 125. Appointment rescheduled.

## 2023-08-01 ENCOUNTER — Ambulatory Visit
Admission: RE | Admit: 2023-08-01 | Discharge: 2023-08-01 | Disposition: A | Discharge status: Home / Self Care | Payer: Medicare Other | Source: Ambulatory Visit | Attending: Family Medicine | Admitting: Family Medicine

## 2023-08-01 DIAGNOSIS — Z1231 Encounter for screening mammogram for malignant neoplasm of breast: Secondary | ICD-10-CM | POA: Insufficient documentation

## 2023-10-31 ENCOUNTER — Inpatient Hospital Stay: Payer: Medicare Other | Attending: Hematology and Oncology

## 2023-11-02 ENCOUNTER — Inpatient Hospital Stay: Payer: Medicare Other

## 2023-12-22 ENCOUNTER — Telehealth: Payer: Self-pay

## 2023-12-22 NOTE — Telephone Encounter (Signed)
 The patient is scheduled for 4/1 with Dr Valentino Saxon.

## 2023-12-26 ENCOUNTER — Other Ambulatory Visit: Payer: Medicare Other

## 2023-12-28 ENCOUNTER — Ambulatory Visit: Payer: Medicare Other

## 2024-01-09 NOTE — Progress Notes (Unsigned)
    GYNECOLOGY PROGRESS NOTE  Subjective:    Patient ID: Anna Williams, female    DOB: 06/30/1946, 78 y.o.   MRN: [id number]  HPI  Patient is a 78 y.o. [id number] female who presents for Pessary Maintenance. She has a h/o urinary incontinence x 1.5 years and small cystocele with rectocele. Currently has a size 2 ring with support and knob pessary in place. She reports no vaginal bleeding or discharge. She denies pelvic discomfort and difficulty urinating or moving her bowels.   The following portions of the patient's history were reviewed and updated as appropriate: allergies, current medications, past family history, past medical history, past social history, past surgical history, and problem list.  Review of Systems Pertinent items are noted in HPI.   Objective:   There were no vitals taken for this visit. There is no height or weight on file to calculate BMI. General appearance: alert, cooperative, and no distress Abdomen: {abdominal exam:16834} Pelvic: {pelvic exam:16852::"cervix normal in appearance","external genitalia normal","no adnexal masses or tenderness","no cervical motion tenderness","rectovaginal septum normal","uterus normal size, shape, and consistency","vagina normal without discharge"} Extremities: {extremity exam:5109} Neurologic: {neuro exam:17854}   Assessment:   No diagnosis found.   Plan:   There are no diagnoses linked to this encounter.    Hildred Laser, MD [facility]

## 2024-01-10 ENCOUNTER — Encounter: Payer: Self-pay | Admitting: Obstetrics and Gynecology

## 2024-01-10 ENCOUNTER — Ambulatory Visit: Admitting: Obstetrics and Gynecology

## 2024-01-10 VITALS — BP 172/70 | HR 84 | Ht 66.0 in | Wt 138.0 lb

## 2024-01-10 DIAGNOSIS — N83202 Unspecified ovarian cyst, left side: Secondary | ICD-10-CM | POA: Diagnosis not present

## 2024-01-10 DIAGNOSIS — Z4689 Encounter for fitting and adjustment of other specified devices: Secondary | ICD-10-CM | POA: Diagnosis not present

## 2024-01-10 DIAGNOSIS — N814 Uterovaginal prolapse, unspecified: Secondary | ICD-10-CM | POA: Diagnosis not present

## 2024-01-10 DIAGNOSIS — N393 Stress incontinence (female) (male): Secondary | ICD-10-CM

## 2024-01-10 DIAGNOSIS — N8110 Cystocele, unspecified: Secondary | ICD-10-CM

## 2024-01-10 MED ORDER — OXYBUTYNIN CHLORIDE ER 5 MG PO TB24: 5.0000 mg | ORAL_TABLET | Freq: Every day | ORAL | 3 refills | Status: DC

## 2024-02-03 ENCOUNTER — Ambulatory Visit

## 2024-02-03 DIAGNOSIS — N83202 Unspecified ovarian cyst, left side: Secondary | ICD-10-CM | POA: Diagnosis not present

## 2024-02-13 ENCOUNTER — Telehealth: Payer: Self-pay

## 2024-02-13 NOTE — Telephone Encounter (Signed)
 Patient of Dr. Ranny Bye called requesting her Ultrasound results be read. She was told that if there was no change she does not need to be seen. Could you please take a look to see if she doesn't need to be seen or if she needs to keep her appointment with you on 05/13. She also thinks that she needs her Ditropan  dose increase. She said it has been working well for her but she has some accidents every now and again. Please advise.    ULTRASOUND REPORT   Location: [facility] Date of Service: 02/03/2024    Indications: left ovarian cyst Findings:  The uterus is anteverted and measures 6.3 x 3.1 x 4.7 cm with a uterine volume of 50.82mL. Echo texture is heterogenous    Echogenic mass is seen within the uterus measuring 4.4 x 3.5 x 2.8 cm   The Endometrium not well visualized   Right Ovary measures 4.5 x 3.4 x 4.5 cm. Cyst is visualized measuring 4.1 x 2.1 x 4.4 cm - appears simple Left Ovary ovary is not seen Survey of the adnexa demonstrates no adnexal masses. There is no free fluid in the cul de sac.   Difficult study. Other imaging modality may be needed   Impression: Endometrial echogenic mass not well visualized - ? fibroid Simple ovarian cyst   Recommendations: 1.Clinical correlation with the patient's History and Physical Exam. 2.  Consider MR

## 2024-02-15 ENCOUNTER — Telehealth: Payer: Self-pay

## 2024-02-15 NOTE — Telephone Encounter (Addendum)
 Pt wants to know if she needs to know if she needs to keep this ultrasound f/up appointment? Can someone call her with results and she needs her oxybutynin  dose increased.

## 2024-02-20 NOTE — Telephone Encounter (Signed)
 Can you call and schedule pt with a u/s f/up with Dr Luster Salters?

## 2024-02-21 ENCOUNTER — Ambulatory Visit: Admitting: Obstetrics & Gynecology

## 2024-02-22 ENCOUNTER — Other Ambulatory Visit: Payer: Self-pay | Admitting: Obstetrics and Gynecology

## 2024-02-22 ENCOUNTER — Ambulatory Visit: Admitting: Obstetrics and Gynecology

## 2024-02-22 ENCOUNTER — Ambulatory Visit: Admitting: Obstetrics

## 2024-02-22 ENCOUNTER — Encounter: Payer: Self-pay | Admitting: Obstetrics and Gynecology

## 2024-02-22 VITALS — BP 178/84 | HR 76 | Ht 66.0 in | Wt 142.3 lb

## 2024-02-22 DIAGNOSIS — N83201 Unspecified ovarian cyst, right side: Secondary | ICD-10-CM | POA: Diagnosis not present

## 2024-02-22 DIAGNOSIS — N393 Stress incontinence (female) (male): Secondary | ICD-10-CM

## 2024-02-22 MED ORDER — OXYBUTYNIN CHLORIDE ER 5 MG PO TB24: 10.0000 mg | ORAL_TABLET | Freq: Every day | ORAL | 3 refills | Status: DC

## 2024-02-22 NOTE — Progress Notes (Signed)
 Patient presents today to follow-up on recent ultrasound.

## 2024-02-22 NOTE — Progress Notes (Signed)
 HPI:      [name] is a 78 y.o. G2P2002 who LMP was No LMP recorded. Patient is postmenopausal.  Subjective:   She presents today to follow-up ovarian cyst.  Patient had a 4 cm cyst diagnosed by MRI and subsequent ultrasound more than a year ago.  This seems to be a simple cyst.  At the same time she was noted to have what appeared to be calcified fibroid near the endometrial lining. She has been asymptomatic with this cyst.  She recently underwent a follow-up ultrasound.    Hx: The following portions of the patient's history were reviewed and updated as appropriate:             She  has a past medical history of Anxiety and depression, ASCUS with positive high risk HPV (09/2013), Cataract, Diabetes mellitus without complication (HCC), Elevated blood sugar, Family history of breast cancer in mother, Fibroid uterus, History of pulmonary embolus (PE), Hypertension, Menopause, Mild dysplasia of cervix (09/2013), Retinal detachment, and Stroke (HCC). She does not have any pertinent problems on file. She  has a past surgical history that includes mole removed (10/11/2012); Cholecystectomy (10/11/2008); Dilation and curettage of uterus; Hysteroscopy; anterior neck fusion; Breast cyst aspiration (Left, yrs ago); Eye surgery; Retinal detachment surgery; Colonoscopy with propofol  (N/A, 11/16/2022); and Esophagogastroduodenoscopy (egd) with propofol  (N/A, 11/16/2022). Her family history includes Breast cancer in her maternal aunt; Breast cancer (age of onset: 98) in her mother; Heart disease in her brother and father; Lung cancer in her brother; Throat cancer in her brother. She  reports that she has never smoked. She has never used smokeless tobacco. She reports that she does not drink alcohol and does not use drugs. She has a current medication list which includes the following prescription(s): amlodipine , aspirin  ec, atorvastatin , bd pen needle nano 2nd gen, calcium -vitamin d -vitamin k, gabapentin,  glucose blood, toujeo  solostar, meloxicam , memantine , mesalamine , methocarbamol, mirtazapine , multiple vitamins-minerals, omeprazole , onetouch delica lancets fine, onetouch ultra, pioglitazone , tramadol, azelastine hcl, diazepam, hydrocodone-acetaminophen , oxybutynin , and trimo-san. She is allergic to codeine.       Review of Systems:  Review of Systems  Constitutional: Denied constitutional symptoms, night sweats, recent illness, fatigue, fever, insomnia and weight loss.  Eyes: Denied eye symptoms, eye pain, photophobia, vision change and visual disturbance.  Ears/Nose/Throat/Neck: Denied ear, nose, throat or neck symptoms, hearing loss, nasal discharge, sinus congestion and sore throat.  Cardiovascular: Denied cardiovascular symptoms, arrhythmia, chest pain/pressure, edema, exercise intolerance, orthopnea and palpitations.  Respiratory: Denied pulmonary symptoms, asthma, pleuritic pain, productive sputum, cough, dyspnea and wheezing.  Gastrointestinal: Denied, gastro-esophageal reflux, melena, nausea and vomiting.  Genitourinary: See HPI for additional information.  Musculoskeletal: Denied musculoskeletal symptoms, stiffness, swelling, muscle weakness and myalgia.  Dermatologic: Denied dermatology symptoms, rash and scar.  Neurologic: Denied neurology symptoms, dizziness, headache, neck pain and syncope.  Psychiatric: Denied psychiatric symptoms, anxiety and depression.  Endocrine: Denied endocrine symptoms including hot flashes and night sweats.   Meds:   Current Outpatient Medications on File Prior to Visit  Medication Sig Dispense Refill   amLODipine  (NORVASC ) 5 MG tablet Take 1 tablet (5 mg total) by mouth daily. Please schedule office visit before any future refill. 90 tablet 0   aspirin  EC 81 MG tablet Take 81 mg by mouth daily.     atorvastatin  (LIPITOR) 40 MG tablet TAKE 1 TABLET BY MOUTH EVERY DAY 90 tablet 0   BD PEN NEEDLE NANO 2ND GEN 32G X 4 MM MISC USE AS DIRECTED 100 each  11   Calcium  Citrate-Vitamin D  (CALCIUM  + D PO) Take 1 tablet by mouth daily.     gabapentin (NEURONTIN) 100 MG capsule Take 100 mg by mouth 2 (two) times daily.     glucose blood test strip OneTouch Ultra Test strips     insulin  glargine, 1 Unit Dial, (TOUJEO  SOLOSTAR) 300 UNIT/ML Solostar Pen Inject 5 Units into the skin daily. 1.5 mL 2   meloxicam  (MOBIC ) 7.5 MG tablet Take 7.5 mg by mouth daily.     memantine  (NAMENDA ) 5 MG tablet TAKE 1 TABLET BY MOUTH TWICE A DAY 180 tablet 1   mesalamine  (LIALDA ) 1.2 g EC tablet Take 2 tablets (2.4 g total) by mouth 2 (two) times daily. 120 tablet 1   methocarbamol (ROBAXIN) 500 MG tablet Take by mouth.     mirtazapine  (REMERON ) 15 MG tablet Take 1 tablet by mouth at bedtime.     Multiple Vitamins-Minerals (PRESERVISION/LUTEIN PO) Take by mouth daily.     omeprazole  (PRILOSEC) 20 MG capsule TAKE 1 CAPSULE BY MOUTH EVERY DAY 90 capsule 1   ONETOUCH DELICA LANCETS FINE MISC Check sugar once daily 50 each 12   ONETOUCH ULTRA test strip CHECK SUGAR ONCE DAILY 100 strip 11   pioglitazone  (ACTOS ) 45 MG tablet Take 1 tablet (45 mg total) by mouth daily. Please schedule office visit before any future refill. 90 tablet 0   traMADol (ULTRAM) 50 MG tablet Take 50 mg by mouth every 6 (six) hours as needed.     Azelastine HCl 137 MCG/SPRAY SOLN Place 1 spray into both nostrils 2 (two) times daily. (Patient not taking: Reported on 02/22/2024)     diazepam (VALIUM) 10 MG tablet Take 10 mg by mouth. (Patient not taking: Reported on 02/22/2024)     HYDROcodone-acetaminophen  (NORCO/VICODIN) 5-325 MG tablet Take 1 tablet by mouth every 6 (six) hours. (Patient not taking: Reported on 02/22/2024)     TRIMO-SAN 0.025-0.01 % GEL PLACE 1 APPLICATORFUL VAGINALLY ONCE A WEEK. AT BEDTIME (Patient not taking: Reported on 02/22/2024)     No current facility-administered medications on file prior to visit.      Objective:      Vitals:   02/22/24 1526 02/22/24 1556  BP: (!)  173/80 (!) 178/84  Pulse: 76    Filed Weights   02/22/24 1526  Weight: 142 lb 4.8 oz (64.5 kg)              Ultrasound and MRI results reviewed.          Assessment:     G2P2002 Patient Active Problem List   Diagnosis Date Noted   Ovarian mass 05/25/2023   Chronic diarrhea 11/16/2022   Gastric erythema 11/16/2022   Need for influenza vaccination 11/08/2022   MCI (mild cognitive impairment) 12/22/2021   Cervical radiculitis 04/25/2020   Confusion state 03/24/2020   Expressive aphasia 03/24/2020   History of recent stroke 03/24/2020   CVA (cerebral vascular accident) (HCC) 03/10/2020   Vaginal atrophy 04/29/2016   Fibroid, uterine 04/29/2016   Pelvic pain in female 04/29/2016   Hypertension 10/16/2015   Diabetes mellitus type 2 without retinopathy (HCC) 05/27/2015   Menopause 04/29/2015   Fibroids 04/29/2015   Anxiety 04/29/2015   Cervical dysplasia 04/29/2015   History of pulmonary embolus (PE) 04/29/2015   Family history of breast cancer in mother 04/29/2015   Retinal detachment 04/29/2015     1. Right ovarian cyst   2. Stress incontinence in female     Ovarian cyst location  currently unknown.  MRI seem to show a left adnexal cyst most recent ultrasound shows right adnexal cyst however the opposite ovary is not visualized well.  Cyst has decreased in size since last imaged.  Patient remains asymptomatic.              I believe this is a low possibility for malignancy.  A cyst decreasing in size and asymptomatic.  Calcified fibroids not an issue.   Plan:            1.  I discussed the cyst in detail with the patient.  I do not believe she needs surgical intervention at this time.  I think it has very low risk of malignancy based on appearance and the fact that it is shrinking in size.  I have reassured her.  Low risk of rupture low risk of torsion. Will plan follow-up ultrasound in 6 months.  Orders Orders Placed This Encounter  Procedures   US  PELVIS  (TRANSABDOMINAL ONLY)     Meds ordered this encounter  Medications   oxybutynin  (DITROPAN  XL) 5 MG 24 hr tablet    Sig: Take 2 tablets (10 mg total) by mouth at bedtime.    Dispense:  90 tablet    Refill:  3      F/U  Return for Pt to contact us  if symptoms worsen, We will contact her with any abnormal test results.  Delice Felt, M.D. 02/22/2024 3:58 PM

## 2024-02-27 ENCOUNTER — Other Ambulatory Visit: Payer: Self-pay | Admitting: Family Medicine

## 2024-02-27 DIAGNOSIS — E119 Type 2 diabetes mellitus without complications: Secondary | ICD-10-CM

## 2024-02-28 ENCOUNTER — Other Ambulatory Visit: Payer: Self-pay | Admitting: Family Medicine

## 2024-02-28 DIAGNOSIS — M5416 Radiculopathy, lumbar region: Secondary | ICD-10-CM

## 2024-03-03 ENCOUNTER — Ambulatory Visit
Admission: RE | Admit: 2024-03-03 | Discharge: 2024-03-03 | Disposition: A | Discharge status: Home / Self Care | Source: Ambulatory Visit | Attending: Family Medicine | Admitting: Family Medicine

## 2024-03-03 DIAGNOSIS — M5416 Radiculopathy, lumbar region: Secondary | ICD-10-CM

## 2024-07-08 ENCOUNTER — Other Ambulatory Visit: Payer: Self-pay | Admitting: Family Medicine

## 2024-07-08 DIAGNOSIS — E119 Type 2 diabetes mellitus without complications: Secondary | ICD-10-CM

## 2024-07-31 ENCOUNTER — Other Ambulatory Visit: Payer: Self-pay | Admitting: Family Medicine

## 2024-07-31 DIAGNOSIS — Z1231 Encounter for screening mammogram for malignant neoplasm of breast: Secondary | ICD-10-CM

## 2024-08-02 ENCOUNTER — Ambulatory Visit
Admission: RE | Admit: 2024-08-02 | Discharge: 2024-08-02 | Disposition: A | Discharge status: Home / Self Care | Source: Ambulatory Visit | Attending: Family Medicine | Admitting: Family Medicine

## 2024-08-02 DIAGNOSIS — Z1231 Encounter for screening mammogram for malignant neoplasm of breast: Secondary | ICD-10-CM | POA: Insufficient documentation

## 2024-10-18 ENCOUNTER — Ambulatory Visit: Admitting: Podiatry

## 2024-10-18 DIAGNOSIS — L989 Disorder of the skin and subcutaneous tissue, unspecified: Secondary | ICD-10-CM | POA: Diagnosis not present

## 2024-10-18 NOTE — Progress Notes (Signed)
 "  Subjective:  Patient ID: Anna Williams, female    DOB: 1946-08-09,  MRN: [id number]  Chief Complaint  Patient presents with   Callouses    79 y.o. female presents with the above complaint.  Patient presents with left submetatarsal 2 porokeratotic lesion/benign skin lesion painful to touch has progressed gotten worse worse with ambulation or shoe or pressure she would like to discuss treatment options for this.  She is a diabetic but with controlled A1c.  Pain scale 7 out of 10 dull aching nature she wears more dress shoes which causes the problem to be worse.  She stands a lot on her feet   Review of Systems: Negative except as noted in the HPI. Denies N/V/F/Ch.  Past Medical History:  Diagnosis Date   Anxiety and depression    ASCUS with positive high risk HPV 09/2013   Cataract    Diabetes mellitus without complication (HCC)    type 2   Elevated blood sugar    Family history of breast cancer in mother    Fibroid uterus    History of pulmonary embolus (PE)    Hypertension    Menopause    Mild dysplasia of cervix 09/2013   repeat pap done -ecc neg, cerival bx cin 1   Retinal detachment    Stroke (HCC)    Current Medications[1]  Tobacco Use History[2]  Allergies[3] Objective:  There were no vitals filed for this visit. There is no height or weight on file to calculate BMI. Constitutional Well developed. Well nourished.  Vascular Dorsalis pedis pulses palpable bilaterally. Posterior tibial pulses palpable bilaterally. Capillary refill normal to all digits.  No cyanosis or clubbing noted. Pedal hair growth normal.  Neurologic Normal speech. Oriented to person, place, and time. Epicritic sensation to light touch grossly present bilaterally.  Dermatologic Left submetatarsal 2 with porokeratotic lesion painful to touch.  Central nucleated core noted.  No pinpoint bleeding noted upon debridement  Orthopedic: Normal joint ROM without pain or crepitus bilaterally. No  visible deformities. No bony tenderness.   Radiographs: None Assessment:   1. Benign skin lesion    Plan:  Patient was evaluated and treated and all questions answered.  Left submetatarsal 2 porokeratotic lesion - All questions and concerns were discussed with the patient in extensive detail given the amount of pain that she is having she would benefit from debridement of the lesion using chisel blade to handle the lesion was debride down to healthy stripe tissue no complication noted no pinpoint bleeding noted - Shoe gear modification discussed extensively  No follow-ups on file.    [1]  Current Outpatient Medications:    amLODipine  (NORVASC ) 5 MG tablet, Take 1 tablet (5 mg total) by mouth daily. Please schedule office visit before any future refill., Disp: 90 tablet, Rfl: 0   aspirin  EC 81 MG tablet, Take 81 mg by mouth daily., Disp: , Rfl:    atorvastatin  (LIPITOR) 40 MG tablet, TAKE 1 TABLET BY MOUTH EVERY DAY, Disp: 90 tablet, Rfl: 0   Azelastine HCl 137 MCG/SPRAY SOLN, Place 1 spray into both nostrils 2 (two) times daily. (Patient not taking: Reported on 02/22/2024), Disp: , Rfl:    BD PEN NEEDLE NANO 2ND GEN 32G X 4 MM MISC, USE AS DIRECTED, Disp: 100 each, Rfl: 11   Calcium  Citrate-Vitamin D  (CALCIUM  + D PO), Take 1 tablet by mouth daily., Disp: , Rfl:    diazepam (VALIUM) 10 MG tablet, Take 10 mg by mouth. (Patient not taking:  Reported on 02/22/2024), Disp: , Rfl:    gabapentin (NEURONTIN) 100 MG capsule, Take 100 mg by mouth 2 (two) times daily., Disp: , Rfl:    glucose blood test strip, OneTouch Ultra Test strips, Disp: , Rfl:    HYDROcodone-acetaminophen  (NORCO/VICODIN) 5-325 MG tablet, Take 1 tablet by mouth every 6 (six) hours. (Patient not taking: Reported on 02/22/2024), Disp: , Rfl:    insulin  glargine, 1 Unit Dial, (TOUJEO  SOLOSTAR) 300 UNIT/ML Solostar Pen, Inject 5 Units into the skin daily., Disp: 1.5 mL, Rfl: 2   meloxicam  (MOBIC ) 7.5 MG tablet, Take 7.5 mg by mouth  daily., Disp: , Rfl:    memantine  (NAMENDA ) 5 MG tablet, TAKE 1 TABLET BY MOUTH TWICE A DAY, Disp: 180 tablet, Rfl: 1   mesalamine  (LIALDA ) 1.2 g EC tablet, Take 2 tablets (2.4 g total) by mouth 2 (two) times daily., Disp: 120 tablet, Rfl: 1   methocarbamol (ROBAXIN) 500 MG tablet, Take by mouth., Disp: , Rfl:    Multiple Vitamins-Minerals (PRESERVISION/LUTEIN PO), Take by mouth daily., Disp: , Rfl:    omeprazole  (PRILOSEC) 20 MG capsule, TAKE 1 CAPSULE BY MOUTH EVERY DAY, Disp: 90 capsule, Rfl: 1   ONETOUCH DELICA LANCETS FINE MISC, Check sugar once daily, Disp: 50 each, Rfl: 12   ONETOUCH ULTRA test strip, CHECK SUGAR ONCE DAILY, Disp: 100 strip, Rfl: 11   oxybutynin  (DITROPAN  XL) 10 MG 24 hr tablet, Take 1 tablet (10 mg total) by mouth at bedtime., Disp: 90 tablet, Rfl: 3   pioglitazone  (ACTOS ) 45 MG tablet, Take 1 tablet (45 mg total) by mouth daily. Please schedule office visit before any future refill., Disp: 90 tablet, Rfl: 0   traMADol (ULTRAM) 50 MG tablet, Take 50 mg by mouth every 6 (six) hours as needed., Disp: , Rfl:    TRIMO-SAN 0.025-0.01 % GEL, PLACE 1 APPLICATORFUL VAGINALLY ONCE A WEEK. AT BEDTIME (Patient not taking: Reported on 02/22/2024), Disp: , Rfl:  [2]  Social History Tobacco Use  Smoking Status Never  Smokeless Tobacco Never  [3]  Allergies Allergen Reactions   Codeine Other (See Comments)    Pt went to sleep and had trouble waking up. Very sensitive to medication.   "

## 2024-10-22 ENCOUNTER — Encounter: Payer: Self-pay | Admitting: Obstetrics

## 2024-10-22 ENCOUNTER — Ambulatory Visit: Admitting: Obstetrics

## 2024-10-22 VITALS — BP 132/66 | HR 71 | Wt 141.0 lb

## 2024-10-22 DIAGNOSIS — Z4689 Encounter for fitting and adjustment of other specified devices: Secondary | ICD-10-CM

## 2024-10-22 DIAGNOSIS — N83202 Unspecified ovarian cyst, left side: Secondary | ICD-10-CM | POA: Diagnosis not present

## 2024-10-22 DIAGNOSIS — N765 Ulceration of vagina: Secondary | ICD-10-CM

## 2024-10-22 DIAGNOSIS — N393 Stress incontinence (female) (male): Secondary | ICD-10-CM

## 2024-10-22 DIAGNOSIS — N952 Postmenopausal atrophic vaginitis: Secondary | ICD-10-CM

## 2024-10-22 DIAGNOSIS — N83201 Unspecified ovarian cyst, right side: Secondary | ICD-10-CM

## 2024-10-22 DIAGNOSIS — N814 Uterovaginal prolapse, unspecified: Secondary | ICD-10-CM

## 2024-10-22 DIAGNOSIS — N8110 Cystocele, unspecified: Secondary | ICD-10-CM | POA: Insufficient documentation

## 2024-10-22 NOTE — Patient Instructions (Addendum)
 Anna Williams

## 2024-10-22 NOTE — Progress Notes (Signed)
 "   GYNECOLOGY PROGRESS NOTE  Subjective:    Patient ID: Anna Williams, female    DOB: [birth year], 79 y.o.   MRN: [id number]  HPI  Patient is a 79 y.o. G15P2002 female who presents for Pessary Maintenance. She has a h/o urinary incontinence and small cystocele with rectocele. Currently has a size 2 ring with support and knob pessary in place. Last seen in April 2025 by Dr. Connell for pessary and then in May 2025 by Dr. Janit for her ovarian cyst. She denies vaginal bleeding or vaginal discharge. She denies pelvic discomfort and difficulty urinating or moving her bowels.  Today pt does not recall that she had ovarian cysts. She initially had a L complex cyst found incidentally on MRI for back pain, ROMA score was elevated, GYN Oncology saw her 05/10/23 and were not concerned for malignancy at that time. Dr. Connell obtained a repeat US  02/03/24 which showed a R simple cyst and the L ovary was not seen. She recommended US  repeat in July '26 for stability. Saw Dr. Janit in May '25, he recommended repeating US  as well. Pt did not complete.   The following portions of the patient's history were reviewed and updated as appropriate: allergies, current medications, past family history, past medical history, past social history, past surgical history, and problem list.  Review of Systems Pertinent items are noted in HPI.   Objective:   Blood pressure 132/66, pulse 71, weight 141 lb (64 kg). Body mass index is 22.76 kg/m.  General appearance: alert, cooperative, and no distress Abdomen: soft, non-tender; bowel sounds normal; no masses,  no organomegaly Pelvic: Pessary removed, with a nickel-sized ulcer at 12:00 on the anterior vaginal wall.    ULTRASOUND REPORT   Location: [facility] Date of Service: 02/03/2024    Indications:left ovarian cyst Findings:  The uterus is anteverted and measures 6.3 x 3.1 x 4.7 cm with a uterine volume of 50.65mL. Echo texture is heterogenous     Echogenic mass is seen within the uterus measuring 4.4 x 3.5 x 2.8 cm   The Endometrium not well visualized   Right Ovary measures 4.5 x 3.4 x 4.5 cm. Cyst is visualized measuring 4.1 x 2.1 x 4.4 cm - appears simple Left Ovary ovary is not seen Survey of the adnexa demonstrates no adnexal masses. There is no free fluid in the cul de sac.   Difficult study. Other imaging modality may be needed   Impression: Endometrial echogenic mass not well visualized - ? fibroid Simple ovarian cyst   Recommendations: 1.Clinical correlation with the patient's History and Physical Exam. 2.  Consider MR   Delon Seamen, RDMS    The ultrasound images and findings were reviewed by me and I agree with the above report.   Alm DOROTHA Janit, M.D. 02/08/2024 1:56 PM  Imaging:  CLINICAL DATA:  Possible left ovarian cyst noted on MRI.   EXAM: TRANSABDOMINAL ULTRASOUND OF PELVIS   TECHNIQUE: Transabdominal ultrasound examination of the pelvis was performed including evaluation of the uterus, ovaries, adnexal regions, and pelvic cul-de-sac.   COMPARISON:  None Available.   FINDINGS: Uterus   Not visualized.   Endometrium   Not visualized.   Right ovary   Not visualized.   Left ovary   Left ovary measures 4.7 x 4.4 x 3.7 cm (40 mL volume). There is a complex cyst with septations in the left ovary measuring 4.9 x 3.9 x 4.5 cm. Additionally there is a heterogeneous hypoechoic area seen  exophytic from or adjacent to the left ovary measuring 2.8 x 2.3 x 2.2 cm. There is vascular flow identified within the septations of the cystic component, but not within the echogenic component.   Other findings:  No abnormal free fluid.   IMPRESSION: 1. Complex cyst with septations in the left ovary measuring up to 4.9 cm. Additionally there is a heterogeneous hypoechoic area seen exophytic from or adjacent to the left ovary measuring up to 2.8 cm. There is vascular flow identified within the  septations of the cystic component, but not within the echogenic component. Findings may be related to ovarian dermoid, but other neoplasms are not excluded. Recommend further evaluation with MRI of the pelvis with and without contrast. 2. Uterus and right ovary are not visualized.   Electronically Signed: By: Greig Pique M.D. On: 05/05/2023 16:44    Assessment:   1. Cystocele with rectocele   2. Vaginal ulcer   3. Pessary maintenance   4. Left ovarian cyst   5. Right ovarian cyst   6. Vaginal atrophy   7. Stress incontinence in female      Plan:   1. Pessary maintenance (Primary) 2. Cystocele with rectocele 3. Vaginal ulcer - With vaginal ulcer, holiday advised  - Pessary cleaned and provided to patient, guidance re: increased symptoms while pessary out - Return in 1 month for reinsertion if healed  - Recommend pessary maintenance q 3 months. Pt states was told 41yr, but cannot find documentation of that.   4. Right ovarian cyst 5. Left ovarian cyst - PMH of complex left ovarian cyst (incidental finding noted on MRI for evaluation for back pain). Had elevated ROMA score. Seen by GYN Oncology 05/10/2023 who were not concerned for malignancy at that time.  Right simple cyst seen 01/2024. Pt did not have follow up US  done.  -Recheck ROMA labs -Repeat US  for stability  Concerned for patient's memory and cognition -- did not recall hx of cysts, did not understand how ulcer developed despite no pessary removal for > 9 mos, had trouble finding words during communication. Inquired about any support/help she has for her appointments and care, she states she does it herself. Unsure if this is her baseline, can see CVA and MCI in problem list, will reach out to PCP.   I personally spent a total of 32 minutes in the care of the patient today including preparing to see the patient, getting/reviewing separately obtained history, performing a medically appropriate exam/evaluation,  counseling and educating, placing orders, referring and communicating with other health care professionals, and documenting clinical information in the EHR.   Estil Mangle, DO [facility] "

## 2024-10-23 LAB — POSTMENOPAUSAL INTERP: LOW

## 2024-10-23 LAB — OVARIAN MALIGNANCY RISK-ROMA
Cancer Antigen (CA) 125: 44 U/mL — ABNORMAL HIGH (ref 0.0–38.1)
HE4: 60 pmol/L (ref 0.0–96.9)
Postmenopausal ROMA: 2.57
Premenopausal ROMA: 1.17 — ABNORMAL HIGH

## 2024-10-23 LAB — PREMENOPAUSAL INTERP: HIGH

## 2024-10-31 ENCOUNTER — Ambulatory Visit: Payer: Self-pay | Admitting: Obstetrics

## 2024-11-08 NOTE — Progress Notes (Unsigned)
" °  HPI:      [name] is a 79 y.o. [phone] female who presents for pessary maintenance and follow up of ovarian cyst. She has a h/o urinary incontinence and small cystocele with rectocele. Currently has a size 2 ring with support and knob pessary in place. She was last seen 10/22/24 at which time  a vaginal ulcer was noted and a pessary holiday was recommended.    She also has a history of a L complex cyst found incidentally on MRI for back pain, ROMA score was elevated, GYN Oncology saw her 05/10/23 and were not concerned for malignancy at that time. Dr. Connell obtained a repeat US  02/03/24 which showed a R simple cyst and the L ovary was not seen. Labwork on 10/31/24 indicated cyst likely benign.  Ultrasound repeated today.    PMHx: She  has a past medical history of Anxiety and depression, ASCUS with positive high risk HPV (09/2013), Cataract, Diabetes mellitus without complication (HCC), Elevated blood sugar, Family history of breast cancer in mother, Fibroid uterus, History of pulmonary embolus (PE), Hypertension, Menopause, Mild dysplasia of cervix (09/2013), Retinal detachment, and Stroke (HCC). Also,  has a past surgical history that includes mole removed (10/11/2012); Cholecystectomy (10/11/2008); Dilation and curettage of uterus; Hysteroscopy; anterior neck fusion; Breast cyst aspiration (Left, yrs ago); Eye surgery; Retinal detachment surgery; Colonoscopy with propofol  (N/A, 11/16/2022); and Esophagogastroduodenoscopy (egd) with propofol  (N/A, 11/16/2022)., family history includes Breast cancer in her maternal aunt; Breast cancer (age of onset: 11) in her mother; Heart disease in her brother and father; Lung cancer in her brother; Throat cancer in her brother.,  reports that she has never smoked. She has never used smokeless tobacco. She reports that she does not drink alcohol and does not use drugs.  She has a current medication list which includes the following prescription(s): amlodipine ,  aspirin  ec, atorvastatin , azelastine hcl, bd pen needle nano 2nd gen, calcium -vitamin d -vitamin k, diazepam, gabapentin, glucose blood, hydrocodone-acetaminophen , toujeo  solostar, meloxicam , memantine , mesalamine , methocarbamol, multiple vitamins-minerals, omeprazole , onetouch delica lancets fine, onetouch ultra, oxybutynin , pioglitazone , tramadol, and trimo-san. Also, is allergic to codeine.  ROS  Objective: There were no vitals taken for this visit. OBGyn Exam  Pessary Care Pessary removed and cleaned.  Vagina checked - without erosions - pessary replaced.  A/P:  ***Pessary was cleaned and replaced today. Instructions given for care. Concerning symptoms to observe for are counseled to patient. Follow up scheduled for 3 months.  ***Pessary holiday advised.  Will leave out and follow symptoms closely.  Replace in 4 weeks.  Rollo FORBES Louder, RN  [facility], [facility] 11/08/2024  3:28 PM  "

## 2024-11-14 ENCOUNTER — Ambulatory Visit: Admitting: Obstetrics

## 2024-11-14 ENCOUNTER — Ambulatory Visit

## 2024-11-14 DIAGNOSIS — N83201 Unspecified ovarian cyst, right side: Secondary | ICD-10-CM

## 2024-11-14 DIAGNOSIS — N83202 Unspecified ovarian cyst, left side: Secondary | ICD-10-CM

## 2024-11-30 ENCOUNTER — Ambulatory Visit: Admitting: Obstetrics
